# Patient Record
Sex: Male | Born: 1940 | Race: White | Hispanic: No | Marital: Married | Smoking: Former smoker
Health system: Southern US, Community
[De-identification: ages and names within clinical notes are randomized; demographics above are authoritative.]

## PROBLEM LIST (undated history)

## (undated) DIAGNOSIS — F419 Anxiety disorder, unspecified: Secondary | ICD-10-CM

## (undated) DIAGNOSIS — M5136 Other intervertebral disc degeneration, lumbar region: Secondary | ICD-10-CM

## (undated) DIAGNOSIS — N390 Urinary tract infection, site not specified: Secondary | ICD-10-CM

## (undated) DIAGNOSIS — M51369 Other intervertebral disc degeneration, lumbar region without mention of lumbar back pain or lower extremity pain: Secondary | ICD-10-CM

## (undated) DIAGNOSIS — H8109 Meniere's disease, unspecified ear: Secondary | ICD-10-CM

## (undated) DIAGNOSIS — J189 Pneumonia, unspecified organism: Secondary | ICD-10-CM

## (undated) DIAGNOSIS — Z86018 Personal history of other benign neoplasm: Secondary | ICD-10-CM

## (undated) DIAGNOSIS — F32A Depression, unspecified: Secondary | ICD-10-CM

## (undated) DIAGNOSIS — F329 Major depressive disorder, single episode, unspecified: Secondary | ICD-10-CM

## (undated) DIAGNOSIS — K429 Umbilical hernia without obstruction or gangrene: Secondary | ICD-10-CM

## (undated) DIAGNOSIS — M5030 Other cervical disc degeneration, unspecified cervical region: Secondary | ICD-10-CM

## (undated) DIAGNOSIS — I10 Essential (primary) hypertension: Secondary | ICD-10-CM

## (undated) DIAGNOSIS — G4730 Sleep apnea, unspecified: Secondary | ICD-10-CM

## (undated) DIAGNOSIS — F0390 Unspecified dementia without behavioral disturbance: Secondary | ICD-10-CM

## (undated) DIAGNOSIS — M549 Dorsalgia, unspecified: Secondary | ICD-10-CM

## (undated) DIAGNOSIS — G8929 Other chronic pain: Secondary | ICD-10-CM

## (undated) HISTORY — PX: GANGLION CYST EXCISION: SHX1691

## (undated) HISTORY — PX: APPENDECTOMY: SHX54

## (undated) HISTORY — PX: HERNIA REPAIR: SHX51

## (undated) HISTORY — DX: Umbilical hernia without obstruction or gangrene: K42.9

---
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
## 2005-05-11 ENCOUNTER — Encounter: Payer: Self-pay | Admitting: Internal Medicine

## 2006-08-04 ENCOUNTER — Ambulatory Visit: Payer: Self-pay | Admitting: Gastroenterology

## 2008-12-20 ENCOUNTER — Ambulatory Visit: Payer: Self-pay | Admitting: Internal Medicine

## 2009-01-30 ENCOUNTER — Ambulatory Visit: Payer: Self-pay | Admitting: Gastroenterology

## 2009-03-04 ENCOUNTER — Ambulatory Visit: Payer: Self-pay | Admitting: Surgery

## 2009-03-04 ENCOUNTER — Ambulatory Visit: Payer: Self-pay | Admitting: Cardiology

## 2009-03-10 ENCOUNTER — Ambulatory Visit: Payer: Self-pay | Admitting: Surgery

## 2009-10-21 ENCOUNTER — Ambulatory Visit: Payer: Self-pay | Admitting: Internal Medicine

## 2009-12-23 DIAGNOSIS — Z86018 Personal history of other benign neoplasm: Secondary | ICD-10-CM

## 2009-12-23 HISTORY — DX: Personal history of other benign neoplasm: Z86.018

## 2011-02-17 ENCOUNTER — Ambulatory Visit: Payer: Self-pay | Admitting: Gastroenterology

## 2012-11-17 ENCOUNTER — Ambulatory Visit: Payer: Self-pay | Admitting: Physical Medicine and Rehabilitation

## 2013-03-26 ENCOUNTER — Ambulatory Visit: Payer: Self-pay | Admitting: Internal Medicine

## 2013-03-26 LAB — CREATININE, SERUM: EGFR (Non-African Amer.): >60

## 2013-10-17 ENCOUNTER — Ambulatory Visit: Payer: Self-pay | Admitting: Neurology

## 2013-10-18 DIAGNOSIS — J189 Pneumonia, unspecified organism: Secondary | ICD-10-CM

## 2013-10-18 HISTORY — DX: Pneumonia, unspecified organism: J18.9

## 2014-02-27 DIAGNOSIS — G301 Alzheimer's disease with late onset: Secondary | ICD-10-CM

## 2014-02-27 DIAGNOSIS — F0280 Dementia in other diseases classified elsewhere without behavioral disturbance: Secondary | ICD-10-CM | POA: Insufficient documentation

## 2014-02-27 DIAGNOSIS — J309 Allergic rhinitis, unspecified: Secondary | ICD-10-CM | POA: Insufficient documentation

## 2014-02-27 DIAGNOSIS — M1990 Unspecified osteoarthritis, unspecified site: Secondary | ICD-10-CM | POA: Insufficient documentation

## 2014-02-27 DIAGNOSIS — M5090 Cervical disc disorder, unspecified, unspecified cervical region: Secondary | ICD-10-CM | POA: Insufficient documentation

## 2014-02-27 DIAGNOSIS — I10 Essential (primary) hypertension: Secondary | ICD-10-CM | POA: Insufficient documentation

## 2014-02-28 DIAGNOSIS — H8109 Meniere's disease, unspecified ear: Secondary | ICD-10-CM | POA: Insufficient documentation

## 2014-02-28 DIAGNOSIS — M519 Unspecified thoracic, thoracolumbar and lumbosacral intervertebral disc disorder: Secondary | ICD-10-CM | POA: Insufficient documentation

## 2016-03-08 ENCOUNTER — Other Ambulatory Visit: Payer: Self-pay | Admitting: Internal Medicine

## 2016-03-08 DIAGNOSIS — R1084 Generalized abdominal pain: Secondary | ICD-10-CM

## 2016-03-09 ENCOUNTER — Ambulatory Visit
Admission: RE | Admit: 2016-03-09 | Discharge: 2016-03-09 | Disposition: A | Discharge status: Home / Self Care | Payer: Medicare Other | Source: Ambulatory Visit | Attending: Internal Medicine | Admitting: Internal Medicine

## 2016-03-09 DIAGNOSIS — R1084 Generalized abdominal pain: Secondary | ICD-10-CM | POA: Diagnosis not present

## 2016-03-09 HISTORY — DX: Essential (primary) hypertension: I10

## 2016-03-09 MED ORDER — IOPAMIDOL (ISOVUE-300) INJECTION 61%
100.0000 mL | Freq: Once | INTRAVENOUS | Status: AC | PRN
  Administered 2016-03-09: 100 mL via INTRAVENOUS

## 2016-07-15 ENCOUNTER — Encounter: Payer: Self-pay | Admitting: Urology

## 2016-07-15 ENCOUNTER — Ambulatory Visit (INDEPENDENT_AMBULATORY_CARE_PROVIDER_SITE_OTHER): Payer: Medicare Other | Admitting: Urology

## 2016-07-15 VITALS — BP 121/79 | HR 78 | Ht 67.5 in | Wt 194.3 lb

## 2016-07-15 DIAGNOSIS — N401 Enlarged prostate with lower urinary tract symptoms: Secondary | ICD-10-CM

## 2016-07-15 DIAGNOSIS — R339 Retention of urine, unspecified: Secondary | ICD-10-CM | POA: Diagnosis not present

## 2016-07-15 DIAGNOSIS — N138 Other obstructive and reflux uropathy: Secondary | ICD-10-CM

## 2016-07-15 LAB — BLADDER SCAN AMB NON-IMAGING

## 2016-07-15 MED ORDER — LIDOCAINE HCL 2 % EX GEL
1.0000 "application " | Freq: Once | CUTANEOUS | Status: AC
  Administered 2016-07-15: 1 via URETHRAL

## 2016-07-15 MED ORDER — FINASTERIDE 5 MG PO TABS: 5.0000 mg | ORAL_TABLET | Freq: Every day | ORAL | 12 refills | Status: DC

## 2016-07-15 NOTE — Progress Notes (Signed)
Simple Catheter Placement  Due to urinary retention patient is present today for a foley cath placement.  Patient was cleaned and prepped in a sterile fashion with betadine and lidocaine jelly 2% was instilled into the urethra.  A 16 FR foley catheter was inserted, urine return was noted  838ml, urine was yellow in color.  The balloon was filled with 10cc of sterile water.  A leg bag was attached for drainage. Patient was also given a night bag to take home and was given instruction on how to change from one bag to another.  Patient was given instruction on proper catheter care.  Patient tolerated well, no complications were noted   Preformed by: Elberta Leatherwood, CMA  Additional notes/ Follow up: 1 week

## 2016-07-15 NOTE — Progress Notes (Signed)
07/15/2016 9:58 AM   Marvin Jefferson 12-14-1940 [id number]  Referring provider: Rusty Aus, MD [place] [facility] [place], [place] [address]  Chief Complaint  Patient presents with  . Benign Prostatic Hypertrophy    HPI: Patient is a 75 year old Caucasian male who is referred for worsening BPH with LUTS by Dr. Emily Filbert.    Patient states that several months ago he started to experience lower abdominal pain associated with lower back pain that was dull and cramping.  He was referred to GI by his PCP and a CT scan with contrast performed in 02/2016 noted mild pelvic caliectasis and bladder distention with significant enlargement of the prostate.  His was initiated on tamsulosin and the patient felt like his abdominal symptoms somewhat improved.  He was still experiencing significant urinary symptoms, so he presented to [place].    His IPSS score today is 23, which is severe lower urinary tract symptomatology. He is terrible with his quality life due to his urinary symptoms.  His PVR is 916 mL.  His major complaint today frequency, urgency, leakage, hesitancy, straining and a weak stream.   He has had these symptoms for several months.  He denies any dysuria, hematuria or suprapubic pain.  He currently taking tamsulosin 0.4 mg daily.   He also denies any recent fevers, chills, nausea or vomiting.  He states his father had his prostate removed when he was in his early 23's.  The patient is not certain if it was due to cancer.       IPSS    Row Name 07/15/16 0900         International Prostate Symptom Score   How often have you had the sensation of not emptying your bladder? Not at All     How often have you had to urinate less than every two hours? About half the time     How often have you found you stopped and started again several times when you urinated? More than half the time     How often have you found it difficult to postpone urination?  Almost always     How often have you had a weak urinary stream? More than half the time     How often have you had to strain to start urination? More than half the time     How many times did you typically get up at night to urinate? 3 Times     Total IPSS Score 23       Quality of Life due to urinary symptoms   If you were to spend the rest of your life with your urinary condition just the way it is now how would you feel about that? Terrible        Score:  1-7 Mild 8-19 Moderate 20-35 Severe    PMH: Past Medical History:  Diagnosis Date  . Hypertension   . Umbilical hernia     Surgical History: Past Surgical History:  Procedure Laterality Date  . APPENDECTOMY      Home Medications:    Medication List       Accurate as of 07/15/16  9:58 AM. Always use your most recent med list.          acetaminophen 325 MG tablet Commonly known as:  TYLENOL Take by mouth.   ALPRAZolam 0.25 MG tablet Commonly known as:  XANAX Take by mouth.   amLODipine 5 MG tablet Commonly known  as:  NORVASC   aspirin EC 81 MG tablet Take by mouth.   diclofenac 50 MG tablet Commonly known as:  CATAFLAM Take by mouth.   finasteride 5 MG tablet Commonly known as:  PROSCAR Take 1 tablet (5 mg total) by mouth daily.   FLUoxetine 10 MG tablet Commonly known as:  PROZAC Take by mouth.   fluticasone 50 MCG/ACT nasal spray Commonly known as:  FLONASE USE 2 SPRAYS IN EACH NOSTRIL EVERY DAY   galantamine 12 MG tablet Commonly known as:  RAZADYNE Take by mouth.   meclizine 25 MG tablet Commonly known as:  ANTIVERT TAKE ONE TABLET THREE TIMES A DAY AS NEEDED   tamsulosin 0.4 MG Caps capsule Commonly known as:  FLOMAX Take by mouth.   tamsulosin 0.4 MG Caps capsule Commonly known as:  FLOMAX   terazosin 5 MG capsule Commonly known as:  HYTRIN Take by mouth.   traMADol 50 MG tablet Commonly known as:  ULTRAM Take by mouth.   V-R VITAMIN B-12 500 MCG tablet Generic  drug:  cyanocobalamin Take by mouth.   Vitamin D3 2000 units capsule Take by mouth.   vitamin E 1000 UNIT capsule Take by mouth.       Allergies:  Allergies  Allergen Reactions  . Clarithromycin     Other reaction(s): Unknown    Family History: Family History  Problem Relation Age of Onset  . Prostate cancer Father   . Hypertension Father   . Stroke Father   . Hypertension Mother   . Dementia Mother   . Bladder Cancer Neg Hx   . Kidney cancer Neg Hx     Social History:  reports that he has quit smoking. He has never used smokeless tobacco. He reports that he does not drink alcohol or use drugs.  ROS: UROLOGY Frequent Urination?: Yes Hard to postpone urination?: Yes Burning/pain with urination?: No Get up at night to urinate?: No Leakage of urine?: Yes Urine stream starts and stops?: Yes Trouble starting stream?: No Do you have to strain to urinate?: Yes Blood in urine?: No Urinary tract infection?: No Sexually transmitted disease?: No Injury to kidneys or bladder?: No Painful intercourse?: No Weak stream?: Yes Erection problems?: No Penile pain?: No  Gastrointestinal Nausea?: Yes Vomiting?: Yes Indigestion/heartburn?: Yes Diarrhea?: No Constipation?: No  Constitutional Fever: No Night sweats?: No Weight loss?: No Fatigue?: Yes  Skin Skin rash/lesions?: No Itching?: No  Eyes Blurred vision?: No Double vision?: No  Ears/Nose/Throat Sore throat?: No Sinus problems?: No  Hematologic/Lymphatic Swollen glands?: No Easy bruising?: No  Cardiovascular Leg swelling?: No Chest pain?: No  Respiratory Cough?: Yes Shortness of breath?: No  Endocrine Excessive thirst?: No  Musculoskeletal Back pain?: Yes Joint pain?: Yes  Neurological Headaches?: No Dizziness?: Yes  Psychologic Depression?: Yes Anxiety?: Yes  Physical Exam: BP 121/79 (BP Location: Left Arm, Patient Position: Sitting, Cuff Size: Normal)   Pulse 78   Ht 5' 7.5"  (1.715 m)   Wt 194 lb 4.8 oz (88.1 kg)   BMI 29.98 kg/m   Constitutional: Well nourished. Alert and oriented, No acute distress. HEENT: [place] AT, moist mucus membranes. Trachea midline, no masses. Cardiovascular: No clubbing, cyanosis, or edema. Respiratory: Normal respiratory effort, no increased work of breathing. GI: Abdomen is soft, non tender, non distended, no abdominal masses. Liver and spleen not palpable.  No hernias appreciated.  Stool sample for occult testing is not indicated.   GU: No CVA tenderness.  No bladder fullness or masses.  Patient  with uncircumcised phallus. Foreskin easily retracted Urethral meatus is patent.  No penile discharge. No penile lesions or rashes. Scrotum without lesions, cysts, rashes and/or edema.  Testicles are located scrotally bilaterally. No masses are appreciated in the testicles. Left and right epididymis are normal. Rectal: Patient with  normal sphincter tone. Anus and perineum without scarring or rashes. No rectal masses are appreciated. Prostate is approximately  60 +grams, could not palpate the entire gland due to the size of the gland, no nodules are appreciated. Seminal vesicles are normal. Skin: No rashes, bruises or suspicious lesions. Lymph: No cervical or inguinal adenopathy. Neurologic: Grossly intact, no focal deficits, moving all 4 extremities. Psychiatric: Normal mood and affect.  Laboratory Data:  Lab Results  Component Value Date   CREATININE 0.92 03/26/2013    PSA on 03/01/2016 was 1.93 ng/mL - stable   Pertinent Imaging: CLINICAL DATA:  Low back pain radiating to umbilical area for 1-2 months. History of diverticulosis.  EXAM: CT ABDOMEN AND PELVIS WITH CONTRAST  TECHNIQUE: Multidetector CT imaging of the abdomen and pelvis was performed using the standard protocol following bolus administration of intravenous contrast.  CONTRAST:  120mL ISOVUE-300 IOPAMIDOL (ISOVUE-300) INJECTION 61%  COMPARISON:   None.  FINDINGS: Lower chest: Lung bases are clear. No effusions. Heart is normal size.  Hepatobiliary: Small hypodensities scattered throughout the liver compatible with small cysts. No biliary ductal dilatation. Gallbladder unremarkable.  Pancreas: No focal abnormality or ductal dilatation.  Spleen: No focal abnormality.  Normal size.  Adrenals/Urinary Tract: Nodular fullness of the left adrenal gland, likely hyperplasia. Mild pelvicaliectasis bilaterally. Bladder is distended, likely related to the prostate enlargement and some degree of bladder outlet obstruction. No focal renal abnormality or stones.  Stomach/Bowel: Stomach, large and small bowel grossly unremarkable. Appendix not identified. Question prior appendectomy. No inflammatory process in the right lower quadrant.  Vascular/Lymphatic: Scattered aortic and iliac calcifications. No aneurysm.  Reproductive: Prostate enlargement.  Other: No free fluid or free air.  Musculoskeletal: No acute bony abnormality. Degenerative changes throughout the lumbar spine.  IMPRESSION: Mild pelvic caliectasis and bladder distention. These findings are likely U related to some degree of bladder outlet obstruction with significant enlargement of the prostate.   Electronically Signed   By: Rolm Baptise M.D.   On: 03/09/2016 09:24      Assessment & Plan:    1. Urinary retention  - Foley catheter was placed without difficulty with a PVR of 800 cc of clear yellow urine  - Explained to the patient that the catheter will need to remain in place for one week  - Explained to the patient that he may experience blood in the urine in the next few days due to injury of the bladder and lack of tamponade effect of urine  - Explained to the patient Foley and leg/overnight bag care  - Given Vesicare 5 mg daily for spasms, reviewed side effects  - RTC in one week for catheter removal and to learn CIC   2. BPH with  LUTS  - IPSS score is 23/6   - Continue conservative management, avoiding bladder irritants and timed voiding's  - Initiate 5 alpha reductase inhibitor (finasteride), discussed side effects  - Continue tamsulosin 0.4 mg daily   - Schedule cystoscopy in anticipation for possible outlet procedure, as prostate is massive and doubtful medical therapy will be effective  - advised patient to postpone his colonoscopy at this time  Return for schedule cystoscopy with Dr. Erlene Quan only.  These notes generated  with voice recognition software. I apologize for typographical errors.  Zara Council, [facility] [address], [place], [place] [address] [phone]

## 2016-07-22 ENCOUNTER — Ambulatory Visit (INDEPENDENT_AMBULATORY_CARE_PROVIDER_SITE_OTHER): Payer: Medicare Other

## 2016-07-22 VITALS — BP 113/77 | HR 84 | Ht 67.0 in | Wt 190.0 lb

## 2016-07-22 DIAGNOSIS — R339 Retention of urine, unspecified: Secondary | ICD-10-CM | POA: Diagnosis not present

## 2016-07-22 DIAGNOSIS — N401 Enlarged prostate with lower urinary tract symptoms: Secondary | ICD-10-CM | POA: Diagnosis not present

## 2016-07-22 DIAGNOSIS — N138 Other obstructive and reflux uropathy: Secondary | ICD-10-CM | POA: Diagnosis not present

## 2016-07-22 NOTE — Progress Notes (Signed)
Catheter Removal  Patient is present today for a catheter removal.  70ml of water was drained from the balloon. A 16FR foley cath was removed from the bladder no complications were noted . Patient tolerated well.  Preformed by: [name], CMA  Continuous Intermittent Catheterization  Due to urinary retention patient is present today for a teaching of self I & O Catheterization. Patient was given detailed verbal and printed instructions of self catheterization. Patient was cleaned and prepped in a sterile fashion.  Due to patient's history of BPH w/LUTS a coude cath was given for insertion. With instruction and assistance patient inserted a 14FR coude and urine return was noted 10 ml, urine was dark yellow in color. Patient tolerated well, no complications were noted Patient was given a sample bag with supplies to take home.  Instructions were given per Zara Council for patient to cath 3 times daily patient is to try and urinate and then cath and record the residuals for a week.  An order was placed with [facility] for catheters to be sent to the patient's home. Patient is to follow up for cysto and bring residual diary to apt. Patient will need to continue CIC until cysto apt where possible surgery will be discussed.   Preformed by: [name], CMA

## 2016-07-27 ENCOUNTER — Ambulatory Visit: Payer: Medicare Other | Admitting: Urology

## 2016-07-27 VITALS — BP 148/80 | HR 88 | Ht 67.0 in | Wt 189.0 lb

## 2016-07-27 DIAGNOSIS — R338 Other retention of urine: Secondary | ICD-10-CM

## 2016-07-27 DIAGNOSIS — N401 Enlarged prostate with lower urinary tract symptoms: Secondary | ICD-10-CM

## 2016-07-27 MED ORDER — CIPROFLOXACIN HCL 500 MG PO TABS
500.0000 mg | ORAL_TABLET | Freq: Once | ORAL | Status: AC
  Administered 2016-07-27: 500 mg via ORAL

## 2016-07-27 MED ORDER — LIDOCAINE HCL 2 % EX GEL
1.0000 "application " | Freq: Once | CUTANEOUS | Status: AC
  Administered 2016-07-27: 1 via URETHRAL

## 2016-07-27 NOTE — Progress Notes (Signed)
07/27/2016 3:35 PM   Marvin Jefferson 19-Jun-1941 [id number]  Referring provider: Rusty Aus, MD [place] [facility] [place], [place] [address]  Chief Complaint  Patient presents with  . Cysto    HPI: 75 year old Caucasian male with chronic urinary retention, massive BPH status post Foley catheter placement for 1 week for urinary decompression and has since been managed with clean intermittent catheterization. He returns to the office today for cystoscopy and discuss his options moving forward.  Patient states that several months ago he started to experience lower abdominal pain associated with lower back pain that was dull and cramping.  He was referred to GI by his PCP and a CT scan with contrast performed in 02/2016 noted mild pelvic caliectasis and bladder distention with significant enlargement of the prostate.  His was initiated on tamsulosin and the patient felt like his abdominal symptoms somewhat improved.   His PVR was 916 mL in the office upon initial urological evaluation and a Foley was placed on 07/15/16.  He returned last week for a voiding trial at which time his catheter was removed. He has been catheterizing himself since then 3 times a day.  He brings in a voiding diary today showing catheterization volumes of as high as close to 700 cc. He does occasionally have the urge to void but is unable to do so since his catheter was removed.   His major complaint prior to Foley frequency, urgency, leakage, hesitancy, straining and a weak stream.   He has had these symptoms for several months but perhaps longer.   No history of bladder stones or UTIs.  He was started on Flomax several weeks ago by his primary care physician in since been started on finasteride by Zara Council.   He states his father had his prostate removed when he was in his early 27's.  The patient is not certain if it was due to cancer.    Cr 1.0 on 02/2016.      PMH: Past Medical History:  Diagnosis Date  . Hypertension   . Umbilical hernia     Surgical History: Past Surgical History:  Procedure Laterality Date  . APPENDECTOMY      Home Medications:    Medication List       Accurate as of 07/27/16  3:35 PM. Always use your most recent med list.          acetaminophen 325 MG tablet Commonly known as:  TYLENOL Take by mouth.   ALPRAZolam 0.25 MG tablet Commonly known as:  XANAX Take by mouth.   amLODipine 5 MG tablet Commonly known as:  NORVASC   aspirin EC 81 MG tablet Take by mouth.   diclofenac 50 MG tablet Commonly known as:  CATAFLAM Take by mouth.   finasteride 5 MG tablet Commonly known as:  PROSCAR Take 1 tablet (5 mg total) by mouth daily.   FLUoxetine 10 MG tablet Commonly known as:  PROZAC Take by mouth.   fluticasone 50 MCG/ACT nasal spray Commonly known as:  FLONASE USE 2 SPRAYS IN EACH NOSTRIL EVERY DAY   galantamine 12 MG tablet Commonly known as:  RAZADYNE Take by mouth.   meclizine 25 MG tablet Commonly known as:  ANTIVERT TAKE ONE TABLET THREE TIMES A DAY AS NEEDED   tamsulosin 0.4 MG Caps capsule Commonly known as:  FLOMAX   terazosin 5 MG capsule Commonly known as:  HYTRIN Take by mouth.   traMADol 50  MG tablet Commonly known as:  ULTRAM Take by mouth.   V-R VITAMIN B-12 500 MCG tablet Generic drug:  cyanocobalamin Take by mouth.   Vitamin D3 2000 units capsule Take by mouth.   vitamin E 1000 UNIT capsule Take by mouth.       Allergies:  Allergies  Allergen Reactions  . Clarithromycin     Other reaction(s): Unknown    Family History: Family History  Problem Relation Age of Onset  . Prostate cancer Father   . Hypertension Father   . Stroke Father   . Hypertension Mother   . Dementia Mother   . Bladder Cancer Neg Hx   . Kidney cancer Neg Hx     Social History:  reports that he has quit smoking. He has never used smokeless tobacco. He reports that he  does not drink alcohol or use drugs.  Physical Exam: BP (!) 148/80   Pulse 88   Ht 5\' 7"  (1.702 m)   Wt 189 lb (85.7 kg)   BMI 29.60 kg/m   Constitutional: Well nourished. Alert and oriented, No acute distress.  Accompanied by his wife today. HEENT: [place] AT, moist mucus membranes. Trachea midline, no masses. Cardiovascular: No clubbing, cyanosis, or edema. Respiratory: Normal respiratory effort, no increased work of breathing. GI: Abdomen is soft, non tender, non distended, no abdominal masses.  GU: Uncircumcised phallus. Foreskin easily retracted Urethral meatus is patent.  Testicles descended bilaterally. Neurologic: Grossly intact, no focal deficits, moving all 4 extremities. Psychiatric: Normal mood and affect.  Laboratory Data:  Lab Results  Component Value Date   CREATININE 0.92 03/26/2013    PSA on 03/01/2016 was 1.93 ng/mL - stable   Pertinent Imaging: CLINICAL DATA:  Low back pain radiating to umbilical area for 1-2 months. History of diverticulosis.  EXAM: CT ABDOMEN AND PELVIS WITH CONTRAST  TECHNIQUE: Multidetector CT imaging of the abdomen and pelvis was performed using the standard protocol following bolus administration of intravenous contrast.  CONTRAST:  167mL ISOVUE-300 IOPAMIDOL (ISOVUE-300) INJECTION 61%  COMPARISON:  None.  FINDINGS: Lower chest: Lung bases are clear. No effusions. Heart is normal size.  Hepatobiliary: Small hypodensities scattered throughout the liver compatible with small cysts. No biliary ductal dilatation. Gallbladder unremarkable.  Pancreas: No focal abnormality or ductal dilatation.  Spleen: No focal abnormality.  Normal size.  Adrenals/Urinary Tract: Nodular fullness of the left adrenal gland, likely hyperplasia. Mild pelvicaliectasis bilaterally. Bladder is distended, likely related to the prostate enlargement and some degree of bladder outlet obstruction. No focal renal abnormality  or stones.  Stomach/Bowel: Stomach, large and small bowel grossly unremarkable. Appendix not identified. Question prior appendectomy. No inflammatory process in the right lower quadrant.  Vascular/Lymphatic: Scattered aortic and iliac calcifications. No aneurysm.  Reproductive: Prostate enlargement.  Other: No free fluid or free air.  Musculoskeletal: No acute bony abnormality. Degenerative changes throughout the lumbar spine.  IMPRESSION: Mild pelvic caliectasis and bladder distention. These findings are likely U related to some degree of bladder outlet obstruction with significant enlargement of the prostate.   Electronically Signed   By: Rolm Baptise M.D.   On: 03/09/2016 09:24    Ct personally reviewed today and with the patient- prostate volume calculated ~115 cc.  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  Bladder currently being managed with clean intermittent catheterization, not spontaneously voiding. Maximal medical therapy with Flomax and finasteride  previously initiated.  At this point in time, I do feel that he would benefit from an outlet procedure as outlet obstruction is most likely cause of his chronic retention. We discussed that ideally, urodynamics could be pursued preop to evaluate his overall bladder function to assess bladder contractility, but given that he is not currently voiding, pressure flow study would not be possible.   Given the size of his gland, I would recommend either a simple prostatectomy versus holmium laser enucleation of the prostate. Risks and benefits of each were discussed. His most interested in a minimally invasive approach. We discussed risk and benefits in detail cutting risk of bleeding, retrograde ejaculation, damage to surrounding structures  including bladder injury, need for further procedures, failure of the procedure, stress incontinence, worsening of his irritative voiding symptoms amongst others. Postoperative course was also discussed. Given that he is in ongoing retention, we will likely leave his Foley in place x 7 days post op and may have to resume CIC.  All he and his wife's questions were answered today in detail.  1. Benign prostatic hyperplasia with urinary retention Schedule HoLEP Preop labs, UCx Continue CIC in the interim, increase frequency to qid given high bladder volumes - Urinalysis, Complete - ciprofloxacin (CIPRO) tablet 500 mg; Take 1 tablet (500 mg total) by mouth once. - lidocaine (XYLOCAINE) 2 % jelly 1 application; Place 1 application into the urethra once.  I spent 25 min with this patient of which greater than 50% was spent in counseling and coordination of care with the patient.  All questions were answered.  Discussion was lengthy today.   Hollice Espy, MD  [facility] [address], [place], [place] [address] [phone]

## 2016-07-27 NOTE — Progress Notes (Signed)
     07/27/16  Cystoscopy Procedure Note  Patient identification was confirmed, informed consent was obtained, and patient was prepped using Betadine solution.  Lidocaine jelly was administered per urethral meatus.    Preoperative abx where received prior to procedure.     Pre-Procedure: - Inspection reveals a normal caliber ureteral meatus.  Procedure: The flexible cystoscope was introduced without difficulty - No urethral strictures/lesions are present. - Enlarged prostate with significant trilobar coaptation, 6.5 cm prostatic length - Elevated bladder neck - Bilateral ureteral orifices identified - Bladder mucosa reveals inflammatory type lesions (areas of bullous edema, mildly raised with ecchymotic type appearance) in the bladder consistent with recent Foley catheter cystitis - No bladder stones - Moderate trabeculation  Retroflexion shows intravesical protrusion with a discrete median lobe and kissing lateral lobes.  Post-Procedure: - Patient tolerated the procedure well   Hollice Espy, MD

## 2016-07-28 ENCOUNTER — Telehealth: Payer: Self-pay | Admitting: Radiology

## 2016-07-28 ENCOUNTER — Other Ambulatory Visit: Payer: Self-pay | Admitting: Radiology

## 2016-07-28 DIAGNOSIS — N401 Enlarged prostate with lower urinary tract symptoms: Secondary | ICD-10-CM

## 2016-07-28 DIAGNOSIS — R338 Other retention of urine: Principal | ICD-10-CM

## 2016-07-28 LAB — MICROSCOPIC EXAMINATION
Epithelial Cells (non renal): NONE SEEN /hpf (ref 0–10)
WBC, UA: >30 /hpf — AB (ref 0–?)

## 2016-07-28 LAB — URINALYSIS, COMPLETE
BILIRUBIN UA: NEGATIVE
GLUCOSE, UA: NEGATIVE
Ketones, UA: NEGATIVE
NITRITE UA: NEGATIVE
PH UA: 5.5 (ref 5.0–7.5)
Specific Gravity, UA: >1.03 — ABNORMAL HIGH (ref 1.005–1.030)
UUROB: 0.2 mg/dL (ref 0.2–1.0)

## 2016-07-28 NOTE — Telephone Encounter (Signed)
Notified pt of surgery scheduled with Dr Erlene Quan on 08/11/16, pre-admit testing appt on 08/02/16 @11 :15 & to call day prior to surgery for arrival time to SDS. Pt voices understanding. Advised pt to hold ASA 81mg  7 days prior to surgery. Pt states he was told by Zara Council to hold ASA so he has not been taking it.

## 2016-07-28 NOTE — Telephone Encounter (Signed)
LMOM. Need to discuss surgery information. 

## 2016-07-29 ENCOUNTER — Ambulatory Visit: Admission: RE | Admit: 2016-07-29 | Payer: Medicare Other | Source: Ambulatory Visit | Admitting: Gastroenterology

## 2016-07-29 ENCOUNTER — Encounter: Admission: RE | Payer: Self-pay | Source: Ambulatory Visit

## 2016-07-29 SURGERY — COLONOSCOPY WITH PROPOFOL
Anesthesia: General

## 2016-08-02 ENCOUNTER — Encounter
Admission: RE | Admit: 2016-08-02 | Discharge: 2016-08-02 | Disposition: A | Discharge status: Home / Self Care | Payer: Medicare Other | Source: Ambulatory Visit | Attending: Urology | Admitting: Urology

## 2016-08-02 DIAGNOSIS — Z01818 Encounter for other preprocedural examination: Secondary | ICD-10-CM | POA: Insufficient documentation

## 2016-08-02 DIAGNOSIS — I10 Essential (primary) hypertension: Secondary | ICD-10-CM | POA: Diagnosis not present

## 2016-08-02 HISTORY — DX: Unspecified dementia, unspecified severity, without behavioral disturbance, psychotic disturbance, mood disturbance, and anxiety: F03.90

## 2016-08-02 HISTORY — DX: Other cervical disc degeneration, unspecified cervical region: M50.30

## 2016-08-02 HISTORY — DX: Other intervertebral disc degeneration, lumbar region without mention of lumbar back pain or lower extremity pain: M51.369

## 2016-08-02 HISTORY — DX: Pneumonia, unspecified organism: J18.9

## 2016-08-02 HISTORY — DX: Other intervertebral disc degeneration, lumbar region: M51.36

## 2016-08-02 HISTORY — DX: Anxiety disorder, unspecified: F41.9

## 2016-08-02 HISTORY — DX: Major depressive disorder, single episode, unspecified: F32.9

## 2016-08-02 HISTORY — DX: Depression, unspecified: F32.A

## 2016-08-02 LAB — BASIC METABOLIC PANEL
ANION GAP: 8 (ref 5–15)
BUN: 16 mg/dL (ref 6–20)
CALCIUM: 8.9 mg/dL (ref 8.9–10.3)
CO2: 24 mmol/L (ref 22–32)
Chloride: 106 mmol/L (ref 101–111)
Creatinine, Ser: 0.94 mg/dL (ref 0.61–1.24)
Glucose, Bld: 89 mg/dL (ref 65–99)
POTASSIUM: 3.5 mmol/L (ref 3.5–5.1)
SODIUM: 138 mmol/L (ref 135–145)

## 2016-08-02 LAB — CBC
HEMATOCRIT: 41.9 % (ref 40.0–52.0)
HEMOGLOBIN: 14.6 g/dL (ref 13.0–18.0)
MCH: 31.9 pg (ref 26.0–34.0)
MCHC: 34.9 g/dL (ref 32.0–36.0)
MCV: 91.3 fL (ref 80.0–100.0)
Platelets: 213 10*3/uL (ref 150–440)
RBC: 4.59 MIL/uL (ref 4.40–5.90)
RDW: 13.3 % (ref 11.5–14.5)
WBC: 7.1 10*3/uL (ref 3.8–10.6)

## 2016-08-02 LAB — URINALYSIS COMPLETE WITH MICROSCOPIC (ARMC ONLY)
BACTERIA UA: NONE SEEN
Bilirubin Urine: NEGATIVE
Glucose, UA: NEGATIVE mg/dL
HGB URINE DIPSTICK: NEGATIVE
KETONES UR: NEGATIVE mg/dL
NITRITE: NEGATIVE
PROTEIN: 30 mg/dL — AB
SPECIFIC GRAVITY, URINE: 1.01 (ref 1.005–1.030)
Squamous Epithelial / LPF: NONE SEEN
pH: 5 (ref 5.0–8.0)

## 2016-08-02 NOTE — Patient Instructions (Signed)
  Your procedure is scheduled on: WED. 08/11/16 Report to Day Surgery.[facility]) To find out your arrival time please call [phone] between 1PM - 3PM on Tues. 08/10/16  Remember: Instructions that are not followed completely may result in serious medical risk, up to and including death, or upon the discretion of your surgeon and anesthesiologist your surgery may need to be rescheduled.    __x__ 1. Do not eat food or drink liquids after midnight. No gum chewing or hard candies.     __x__ 2. No Alcohol for 24 hours before or after surgery.   ____ 3. Do Not Smoke For 24 Hours Prior to Your Surgery.   ____ 4. Bring all medications with you on the day of surgery if instructed.    ___x_ 5. Notify your doctor if there is any change in your medical condition     (cold, fever, infections).       Do not wear jewelry, make-up, hairpins, clips or nail polish.  Do not wear lotions, powders, or perfumes. You may wear deodorant.  Do not shave 48 hours prior to surgery. Men may shave face and neck.  Do not bring valuables to the hospital.    [facility] is not responsible for any belongings or valuables.               Contacts, dentures or bridgework may not be worn into surgery.  Leave your suitcase in the car. After surgery it may be brought to your room.  For patients admitted to the hospital, discharge time is determined by your                treatment team.   Patients discharged the day of surgery will not be allowed to drive home.   Please read over the following fact sheets that you were given:      _x___ Take these medicines the morning of surgery with A SIP OF WATER:    1. amLODipine (NORVASC) 5 MG tablet  2. FLUoxetine (PROZAC) 10 MG tablet  3. fluticasone (FLONASE) 50 MCG/ACT nasal spray  4.terazosin (HYTRIN) 5 MG capsule  5.traMADol (ULTRAM) 50 MG tablet(if needed)  6.  ____ Fleet Enema (as directed)   ____ Use CHG Soap as directed  ____ Use inhalers on the day  of surgery  ____ Stop metformin 2 days prior to surgery    ____ Take 1/2 of usual insulin dose the night before surgery and none on the morning of surgery.   ___x_ Stop aspirin already  ___x_ Stop Anti-inflammatories on 08/04/16 diclofenac (CATAFLAM) 50 MG tablet,  Tylenol is OK   ___x_ Stop supplements on 10/18/17Omega-3 Fatty Acids (FISH OIL) 1000 MG CAPS,Misc Natural Products (GLUCOSAMINE CHOND MSM FORMULA PO)vitamin E 1000 UNIT capsule  ____ Bring C-Pap to the hospital.

## 2016-08-03 ENCOUNTER — Encounter: Payer: Self-pay | Admitting: Urology

## 2016-08-03 DIAGNOSIS — N400 Enlarged prostate without lower urinary tract symptoms: Secondary | ICD-10-CM

## 2016-08-04 ENCOUNTER — Telehealth: Payer: Self-pay

## 2016-08-04 DIAGNOSIS — N390 Urinary tract infection, site not specified: Secondary | ICD-10-CM

## 2016-08-04 LAB — URINE CULTURE: Culture: >=100000 — AB

## 2016-08-04 MED ORDER — NITROFURANTOIN MONOHYD MACRO 100 MG PO CAPS: 100.0000 mg | ORAL_CAPSULE | Freq: Two times a day (BID) | ORAL | 0 refills | Status: AC

## 2016-08-04 MED ORDER — TAMSULOSIN HCL 0.4 MG PO CAPS: 0.4000 mg | ORAL_CAPSULE | Freq: Every day | ORAL | 0 refills | Status: DC

## 2016-08-04 NOTE — Telephone Encounter (Signed)
Spoke with pt in reference to +ucx and needing to start today for surgery. Pt voiced understanding. Medication sent to pharmacy.

## 2016-08-04 NOTE — Telephone Encounter (Signed)
-----   Message from Hollice Espy, MD sent at 08/04/2016 10:15 AM EDT ----- Treat preop with macrobid 100 mg bid x 10 days starting today.  Hollice Espy, MD

## 2016-08-05 NOTE — Pre-Procedure Instructions (Signed)
Urine culture results faxed to Dr. Cherrie Gauze office for review and treatment as indicated.

## 2016-08-09 ENCOUNTER — Encounter: Payer: Self-pay | Admitting: Urology

## 2016-08-11 ENCOUNTER — Ambulatory Visit: Payer: Medicare Other | Admitting: Anesthesiology

## 2016-08-11 ENCOUNTER — Ambulatory Visit
Admission: RE | Admit: 2016-08-11 | Discharge: 2016-08-11 | Disposition: A | Discharge status: Home / Self Care | Payer: Medicare Other | Source: Ambulatory Visit | Attending: Urology | Admitting: Urology

## 2016-08-11 ENCOUNTER — Encounter
Admission: RE | Disposition: A | Discharge status: Home / Self Care | Payer: Self-pay | Source: Ambulatory Visit | Attending: Urology

## 2016-08-11 ENCOUNTER — Encounter: Payer: Self-pay | Admitting: Anesthesiology

## 2016-08-11 DIAGNOSIS — F418 Other specified anxiety disorders: Secondary | ICD-10-CM | POA: Insufficient documentation

## 2016-08-11 DIAGNOSIS — N138 Other obstructive and reflux uropathy: Secondary | ICD-10-CM | POA: Diagnosis not present

## 2016-08-11 DIAGNOSIS — I10 Essential (primary) hypertension: Secondary | ICD-10-CM | POA: Insufficient documentation

## 2016-08-11 DIAGNOSIS — R338 Other retention of urine: Secondary | ICD-10-CM | POA: Insufficient documentation

## 2016-08-11 DIAGNOSIS — Z87891 Personal history of nicotine dependence: Secondary | ICD-10-CM | POA: Insufficient documentation

## 2016-08-11 DIAGNOSIS — Z7951 Long term (current) use of inhaled steroids: Secondary | ICD-10-CM | POA: Diagnosis not present

## 2016-08-11 DIAGNOSIS — Z7982 Long term (current) use of aspirin: Secondary | ICD-10-CM | POA: Diagnosis not present

## 2016-08-11 DIAGNOSIS — Z79899 Other long term (current) drug therapy: Secondary | ICD-10-CM | POA: Diagnosis not present

## 2016-08-11 DIAGNOSIS — Z538 Procedure and treatment not carried out for other reasons: Secondary | ICD-10-CM | POA: Insufficient documentation

## 2016-08-11 DIAGNOSIS — N401 Enlarged prostate with lower urinary tract symptoms: Secondary | ICD-10-CM | POA: Insufficient documentation

## 2016-08-11 SURGERY — ENUCLEATION, PROSTATE, USING LASER, WITH MORCELLATION
Anesthesia: General

## 2016-08-11 MED ORDER — LACTATED RINGERS IV SOLN
INTRAVENOUS | Status: DC
  Administered 2016-08-11: 11:00:00 via INTRAVENOUS

## 2016-08-11 MED ORDER — FAMOTIDINE 20 MG PO TABS
ORAL_TABLET | ORAL | Status: AC
  Filled 2016-08-11: qty 1

## 2016-08-11 MED ORDER — FAMOTIDINE 20 MG PO TABS
20.0000 mg | ORAL_TABLET | Freq: Once | ORAL | Status: AC
  Administered 2016-08-11: 20 mg via ORAL

## 2016-08-11 MED ORDER — CEFAZOLIN SODIUM-DEXTROSE 2-4 GM/100ML-% IV SOLN: 2.0000 g | INTRAVENOUS | Status: DC

## 2016-08-11 MED ORDER — CEFAZOLIN SODIUM-DEXTROSE 2-4 GM/100ML-% IV SOLN
INTRAVENOUS | Status: AC
  Filled 2016-08-11: qty 100

## 2016-08-11 SURGICAL SUPPLY — 35 items
ADAPTER IRRIG TUBE 2 SPIKE SOL (ADAPTER) ×6 IMPLANT
BAG URO DRAIN 2000ML W/SPOUT (MISCELLANEOUS) ×3 IMPLANT
BAG URO DRAIN 4000ML (MISCELLANEOUS) ×3 IMPLANT
CATH FOL 2WAY LX 20X30 (CATHETERS) IMPLANT
CATH FOL LEG HOLDER (MISCELLANEOUS) ×3 IMPLANT
CATH FOLEY 3WAY 30CC 22FR (CATHETERS) IMPLANT
CATH URETL 5X70 OPEN END (CATHETERS) ×3 IMPLANT
CNTNR SPEC 2.5X3XGRAD LEK (MISCELLANEOUS) ×1
CONT SPEC 4OZ STER OR WHT (MISCELLANEOUS) ×2
CONTAINER COLLECT MORCELLATR (MISCELLANEOUS) ×1 IMPLANT
CONTAINER SPEC 2.5X3XGRAD LEK (MISCELLANEOUS) ×1 IMPLANT
DRAPE SHEET LG 3/4 BI-LAMINATE (DRAPES) ×3 IMPLANT
DRAPE UTILITY 15X26 TOWEL STRL (DRAPES) ×3 IMPLANT
FILTER OVERFLOW MORCELLATOR (FILTER) ×1 IMPLANT
GLOVE BIO SURGEON STRL SZ 6.5 (GLOVE) ×2 IMPLANT
GLOVE BIO SURGEONS STRL SZ 6.5 (GLOVE) ×1
GOWN STRL REUS W/ TWL LRG LVL3 (GOWN DISPOSABLE) ×2 IMPLANT
GOWN STRL REUS W/TWL LRG LVL3 (GOWN DISPOSABLE) ×4
KIT RM TURNOVER CYSTO AR (KITS) ×3 IMPLANT
MORCELLATOR COLLECT CONTAINER (MISCELLANEOUS) ×3
MORCELLATOR OVERFLOW FILTER (FILTER) ×3
MORCELLATOR ROTATION 4.75 335 (MISCELLANEOUS) ×3 IMPLANT
PACK CYSTO AR (MISCELLANEOUS) ×3 IMPLANT
PREP PVP WINGED SPONGE (MISCELLANEOUS) ×3 IMPLANT
PUMP SINGLE ACTION SAP (PUMP) IMPLANT
SENSORWIRE 0.038 NOT ANGLED (WIRE) ×6
SET IRRIG Y TYPE TUR BLADDER L (SET/KITS/TRAYS/PACK) ×3 IMPLANT
SOL .9 NS 3000ML IRR  AL (IV SOLUTION) ×12
SOL .9 NS 3000ML IRR UROMATIC (IV SOLUTION) ×6 IMPLANT
SYRINGE IRR TOOMEY STRL 70CC (SYRINGE) ×3 IMPLANT
TUBE PUMP MORCELLATOR PIRANHA (TUBING) ×3 IMPLANT
TUBING CONNECTING 10 (TUBING) ×2 IMPLANT
TUBING CONNECTING 10' (TUBING) ×1
WATER STERILE IRR 1000ML POUR (IV SOLUTION) ×3 IMPLANT
WIRE SENSOR 0.038 NOT ANGLED (WIRE) ×2 IMPLANT

## 2016-08-11 NOTE — OR Nursing (Signed)
Dr. Erlene Quan in to speak to patient and his wife to explain that surgery would have to be cancelled today as the proper equipment for surgery was not available.  Patient was understanding about this even though it requires him to continue to straight cath himself for another 2 weeks and to purchase supplies for another 2 weeks.  Patient and his wife were very acceptinig of the situation and were discharged home ambulatory.  Instructed to call SDS the day before surgery between 1 and 3pm to learn of arrival time for a return surgery.

## 2016-08-11 NOTE — Anesthesia Preprocedure Evaluation (Addendum)
Anesthesia Evaluation  Patient identified by MRN, date of birth, ID band Patient awake    Reviewed: Allergy & Precautions, NPO status , Patient's Chart, lab work & pertinent test results  Airway Mallampati: II  TM Distance: >3 FB     Dental  (+) Caps   Pulmonary pneumonia, resolved, former smoker,    Pulmonary exam normal        Cardiovascular hypertension, Pt. on medications Normal cardiovascular exam     Neuro/Psych PSYCHIATRIC DISORDERS Anxiety Depression    GI/Hepatic negative GI ROS, Neg liver ROS,   Endo/Other  negative endocrine ROS  Renal/GU negative Renal ROS  negative genitourinary   Musculoskeletal  (+) Arthritis , Osteoarthritis,    Abdominal Normal abdominal exam  (+)   Peds negative pediatric ROS (+)  Hematology negative hematology ROS (+)   Anesthesia Other Findings   Reproductive/Obstetrics                            Anesthesia Physical Anesthesia Plan  ASA: III  Anesthesia Plan: General   Post-op Pain Management:    Induction: Intravenous  Airway Management Planned: Oral ETT  Additional Equipment:   Intra-op Plan:   Post-operative Plan: Extubation in OR  Informed Consent: I have reviewed the patients History and Physical, chart, labs and discussed the procedure including the risks, benefits and alternatives for the proposed anesthesia with the patient or authorized representative who has indicated his/her understanding and acceptance.   Dental advisory given  Plan Discussed with: CRNA and Surgeon  Anesthesia Plan Comments:         Anesthesia Quick Evaluation

## 2016-08-11 NOTE — Interval H&P Note (Signed)
History and Physical Interval Note:  08/11/2016 10:18 AM  Marvin Jefferson  has presented today for surgery, with the diagnosis of bph with urinary retention  The various methods of treatment have been discussed with the patient and family. After consideration of risks, benefits and other options for treatment, the patient has consented to  Procedure(s): [place] WITH MORCELLATION (N/A) as a surgical intervention .  The patient's history has been reviewed, patient examined, no change in status, stable for surgery.  I have reviewed the patient's chart and labs.  Questions were answered to the patient's satisfaction.     RRR CTAB  Hollice Espy

## 2016-08-11 NOTE — H&P (View-Only) (Signed)
07/27/2016 3:35 PM   Marvin Jefferson September 10, 1941 [id number]  Referring provider: Rusty Aus, MD [facility] [place], [place] [address]  Chief Complaint  Patient presents with  . Cysto    HPI: 75 year old Caucasian male with chronic urinary retention, massive BPH status post Foley catheter placement for 1 week for urinary decompression and has since been managed with clean intermittent catheterization. He returns to the office today for cystoscopy and discuss his options moving forward.  Patient states that several months ago he started to experience lower abdominal pain associated with lower back pain that was dull and cramping.  He was referred to GI by his PCP and a CT scan with contrast performed in 02/2016 noted mild pelvic caliectasis and bladder distention with significant enlargement of the prostate.  His was initiated on tamsulosin and the patient felt like his abdominal symptoms somewhat improved.   His PVR was 916 mL in the office upon initial urological evaluation and a Foley was placed on 07/15/16.  He returned last week for a voiding trial at which time his catheter was removed. He has been catheterizing himself since then 3 times a day.  He brings in a voiding diary today showing catheterization volumes of as high as close to 700 cc. He does occasionally have the urge to void but is unable to do so since his catheter was removed.   His major complaint prior to Foley frequency, urgency, leakage, hesitancy, straining and a weak stream.   He has had these symptoms for several months but perhaps longer.   No history of bladder stones or UTIs.  He was started on Flomax several weeks ago by his primary care physician in since been started on finasteride by Zara Council.   He states his father had his prostate removed when he was in his early 68's.  The patient is not certain if it was due to cancer.    Cr 1.0 on 02/2016.      PMH: Past Medical History:  Diagnosis Date  . Hypertension   . Umbilical hernia     Surgical History: Past Surgical History:  Procedure Laterality Date  . APPENDECTOMY      Home Medications:    Medication List       Accurate as of 07/27/16  3:35 PM. Always use your most recent med list.          acetaminophen 325 MG tablet Commonly known as:  TYLENOL Take by mouth.   ALPRAZolam 0.25 MG tablet Commonly known as:  XANAX Take by mouth.   amLODipine 5 MG tablet Commonly known as:  NORVASC   aspirin EC 81 MG tablet Take by mouth.   diclofenac 50 MG tablet Commonly known as:  CATAFLAM Take by mouth.   finasteride 5 MG tablet Commonly known as:  PROSCAR Take 1 tablet (5 mg total) by mouth daily.   FLUoxetine 10 MG tablet Commonly known as:  PROZAC Take by mouth.   fluticasone 50 MCG/ACT nasal spray Commonly known as:  FLONASE USE 2 SPRAYS IN EACH NOSTRIL EVERY DAY   galantamine 12 MG tablet Commonly known as:  RAZADYNE Take by mouth.   meclizine 25 MG tablet Commonly known as:  ANTIVERT TAKE ONE TABLET THREE TIMES A DAY AS NEEDED   tamsulosin 0.4 MG Caps capsule Commonly known as:  FLOMAX   terazosin 5 MG capsule Commonly known as:  HYTRIN Take by mouth.   traMADol 50  MG tablet Commonly known as:  ULTRAM Take by mouth.   V-R VITAMIN B-12 500 MCG tablet Generic drug:  cyanocobalamin Take by mouth.   Vitamin D3 2000 units capsule Take by mouth.   vitamin E 1000 UNIT capsule Take by mouth.       Allergies:  Allergies  Allergen Reactions  . Clarithromycin     Other reaction(s): Unknown    Family History: Family History  Problem Relation Age of Onset  . Prostate cancer Father   . Hypertension Father   . Stroke Father   . Hypertension Mother   . Dementia Mother   . Bladder Cancer Neg Hx   . Kidney cancer Neg Hx     Social History:  reports that he has quit smoking. He has never used smokeless tobacco. He reports that he  does not drink alcohol or use drugs.  Physical Exam: BP (!) 148/80   Pulse 88   Ht 5\' 7"  (1.702 m)   Wt 189 lb (85.7 kg)   BMI 29.60 kg/m   Constitutional: Well nourished. Alert and oriented, No acute distress.  Accompanied by his wife today. HEENT: [place] AT, moist mucus membranes. Trachea midline, no masses. Cardiovascular: No clubbing, cyanosis, or edema. Respiratory: Normal respiratory effort, no increased work of breathing. GI: Abdomen is soft, non tender, non distended, no abdominal masses.  GU: Uncircumcised phallus. Foreskin easily retracted Urethral meatus is patent.  Testicles descended bilaterally. Neurologic: Grossly intact, no focal deficits, moving all 4 extremities. Psychiatric: Normal mood and affect.  Laboratory Data:  Lab Results  Component Value Date   CREATININE 0.92 03/26/2013    PSA on 03/01/2016 was 1.93 ng/mL - stable   Pertinent Imaging: CLINICAL DATA:  Low back pain radiating to umbilical area for 1-2 months. History of diverticulosis.  EXAM: CT ABDOMEN AND PELVIS WITH CONTRAST  TECHNIQUE: Multidetector CT imaging of the abdomen and pelvis was performed using the standard protocol following bolus administration of intravenous contrast.  CONTRAST:  137mL ISOVUE-300 IOPAMIDOL (ISOVUE-300) INJECTION 61%  COMPARISON:  None.  FINDINGS: Lower chest: Lung bases are clear. No effusions. Heart is normal size.  Hepatobiliary: Small hypodensities scattered throughout the liver compatible with small cysts. No biliary ductal dilatation. Gallbladder unremarkable.  Pancreas: No focal abnormality or ductal dilatation.  Spleen: No focal abnormality.  Normal size.  Adrenals/Urinary Tract: Nodular fullness of the left adrenal gland, likely hyperplasia. Mild pelvicaliectasis bilaterally. Bladder is distended, likely related to the prostate enlargement and some degree of bladder outlet obstruction. No focal renal abnormality  or stones.  Stomach/Bowel: Stomach, large and small bowel grossly unremarkable. Appendix not identified. Question prior appendectomy. No inflammatory process in the right lower quadrant.  Vascular/Lymphatic: Scattered aortic and iliac calcifications. No aneurysm.  Reproductive: Prostate enlargement.  Other: No free fluid or free air.  Musculoskeletal: No acute bony abnormality. Degenerative changes throughout the lumbar spine.  IMPRESSION: Mild pelvic caliectasis and bladder distention. These findings are likely U related to some degree of bladder outlet obstruction with significant enlargement of the prostate.   Electronically Signed   By: Rolm Baptise M.D.   On: 03/09/2016 09:24    Ct personally reviewed today and with the patient- prostate volume calculated ~115 cc.  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  Bladder currently being managed with clean intermittent catheterization, not spontaneously voiding. Maximal medical therapy with Flomax and finasteride  previously initiated.  At this point in time, I do feel that he would benefit from an outlet procedure as outlet obstruction is most likely cause of his chronic retention. We discussed that ideally, urodynamics could be pursued preop to evaluate his overall bladder function to assess bladder contractility, but given that he is not currently voiding, pressure flow study would not be possible.   Given the size of his gland, I would recommend either a simple prostatectomy versus holmium laser enucleation of the prostate. Risks and benefits of each were discussed. His most interested in a minimally invasive approach. We discussed risk and benefits in detail cutting risk of bleeding, retrograde ejaculation, damage to surrounding structures  including bladder injury, need for further procedures, failure of the procedure, stress incontinence, worsening of his irritative voiding symptoms amongst others. Postoperative course was also discussed. Given that he is in ongoing retention, we will likely leave his Foley in place x 7 days post op and may have to resume CIC.  All he and his wife's questions were answered today in detail.  1. Benign prostatic hyperplasia with urinary retention Schedule HoLEP Preop labs, UCx Continue CIC in the interim, increase frequency to qid given high bladder volumes - Urinalysis, Complete - ciprofloxacin (CIPRO) tablet 500 mg; Take 1 tablet (500 mg total) by mouth once. - lidocaine (XYLOCAINE) 2 % jelly 1 application; Place 1 application into the urethra once.  I spent 25 min with this patient of which greater than 50% was spent in counseling and coordination of care with the patient.  All questions were answered.  Discussion was lengthy today.   Hollice Espy, MD  [facility] [address] [place], [place] [address] [phone]

## 2016-08-19 ENCOUNTER — Encounter: Payer: Self-pay | Admitting: Urology

## 2016-08-20 ENCOUNTER — Other Ambulatory Visit: Payer: Self-pay | Admitting: Radiology

## 2016-08-20 DIAGNOSIS — R338 Other retention of urine: Principal | ICD-10-CM

## 2016-08-20 DIAGNOSIS — N401 Enlarged prostate with lower urinary tract symptoms: Secondary | ICD-10-CM

## 2016-08-22 MED ORDER — CEFAZOLIN SODIUM-DEXTROSE 2-4 GM/100ML-% IV SOLN
2.0000 g | INTRAVENOUS | Status: AC
  Administered 2016-08-23: 2 g via INTRAVENOUS

## 2016-08-23 ENCOUNTER — Ambulatory Visit: Payer: Medicare Other | Admitting: Anesthesiology

## 2016-08-23 ENCOUNTER — Ambulatory Visit
Admission: RE | Admit: 2016-08-23 | Discharge: 2016-08-23 | Disposition: A | Discharge status: Home / Self Care | Payer: Medicare Other | Source: Ambulatory Visit | Attending: Urology | Admitting: Urology

## 2016-08-23 ENCOUNTER — Encounter: Payer: Self-pay | Admitting: *Deleted

## 2016-08-23 ENCOUNTER — Encounter
Admission: RE | Disposition: A | Discharge status: Home / Self Care | Payer: Self-pay | Source: Ambulatory Visit | Attending: Urology

## 2016-08-23 DIAGNOSIS — Z823 Family history of stroke: Secondary | ICD-10-CM | POA: Insufficient documentation

## 2016-08-23 DIAGNOSIS — Z8042 Family history of malignant neoplasm of prostate: Secondary | ICD-10-CM | POA: Diagnosis not present

## 2016-08-23 DIAGNOSIS — Z87891 Personal history of nicotine dependence: Secondary | ICD-10-CM | POA: Diagnosis not present

## 2016-08-23 DIAGNOSIS — Z8489 Family history of other specified conditions: Secondary | ICD-10-CM | POA: Diagnosis not present

## 2016-08-23 DIAGNOSIS — F329 Major depressive disorder, single episode, unspecified: Secondary | ICD-10-CM | POA: Insufficient documentation

## 2016-08-23 DIAGNOSIS — N410 Acute prostatitis: Secondary | ICD-10-CM | POA: Diagnosis not present

## 2016-08-23 DIAGNOSIS — N3289 Other specified disorders of bladder: Secondary | ICD-10-CM | POA: Insufficient documentation

## 2016-08-23 DIAGNOSIS — N401 Enlarged prostate with lower urinary tract symptoms: Secondary | ICD-10-CM | POA: Insufficient documentation

## 2016-08-23 DIAGNOSIS — Z881 Allergy status to other antibiotic agents status: Secondary | ICD-10-CM | POA: Insufficient documentation

## 2016-08-23 DIAGNOSIS — I10 Essential (primary) hypertension: Secondary | ICD-10-CM | POA: Insufficient documentation

## 2016-08-23 DIAGNOSIS — Z7982 Long term (current) use of aspirin: Secondary | ICD-10-CM | POA: Diagnosis not present

## 2016-08-23 DIAGNOSIS — R338 Other retention of urine: Secondary | ICD-10-CM | POA: Insufficient documentation

## 2016-08-23 DIAGNOSIS — N411 Chronic prostatitis: Secondary | ICD-10-CM | POA: Diagnosis not present

## 2016-08-23 DIAGNOSIS — Z8249 Family history of ischemic heart disease and other diseases of the circulatory system: Secondary | ICD-10-CM | POA: Diagnosis not present

## 2016-08-23 DIAGNOSIS — F419 Anxiety disorder, unspecified: Secondary | ICD-10-CM | POA: Insufficient documentation

## 2016-08-23 HISTORY — PX: 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: SHX6641

## 2016-08-23 SURGERY — ENUCLEATION, PROSTATE, USING LASER, WITH MORCELLATION
Anesthesia: General | Site: Penis | Wound class: Clean Contaminated

## 2016-08-23 MED ORDER — FENTANYL CITRATE (PF) 100 MCG/2ML IJ SOLN: 25.0000 ug | INTRAMUSCULAR | Status: DC | PRN

## 2016-08-23 MED ORDER — SUGAMMADEX SODIUM 500 MG/5ML IV SOLN
INTRAVENOUS | Status: DC | PRN
  Administered 2016-08-23: 180 mg via INTRAVENOUS

## 2016-08-23 MED ORDER — EPHEDRINE SULFATE 50 MG/ML IJ SOLN
INTRAMUSCULAR | Status: DC | PRN
  Administered 2016-08-23 (×2): 10 mg via INTRAVENOUS
  Administered 2016-08-23: 5 mg via INTRAVENOUS
  Administered 2016-08-23: 10 mg via INTRAVENOUS
  Administered 2016-08-23: 15 mg via INTRAVENOUS

## 2016-08-23 MED ORDER — MIDAZOLAM HCL 2 MG/2ML IJ SOLN
INTRAMUSCULAR | Status: DC | PRN
  Administered 2016-08-23: 2 mg via INTRAVENOUS

## 2016-08-23 MED ORDER — HYDROCODONE-ACETAMINOPHEN 5-325 MG PO TABS: 1.0000 | ORAL_TABLET | Freq: Four times a day (QID) | ORAL | 0 refills | Status: DC | PRN

## 2016-08-23 MED ORDER — PHENYLEPHRINE HCL 10 MG/ML IJ SOLN
INTRAMUSCULAR | Status: DC | PRN
  Administered 2016-08-23: 75 ug/min via INTRAVENOUS

## 2016-08-23 MED ORDER — CEFAZOLIN SODIUM-DEXTROSE 2-4 GM/100ML-% IV SOLN
INTRAVENOUS | Status: AC
  Filled 2016-08-23: qty 100

## 2016-08-23 MED ORDER — LIDOCAINE HCL (CARDIAC) 20 MG/ML IV SOLN
INTRAVENOUS | Status: DC | PRN
  Administered 2016-08-23: 80 mg via INTRAVENOUS

## 2016-08-23 MED ORDER — ONDANSETRON HCL 4 MG/2ML IJ SOLN: 4.0000 mg | Freq: Once | INTRAMUSCULAR | Status: DC | PRN

## 2016-08-23 MED ORDER — FENTANYL CITRATE (PF) 100 MCG/2ML IJ SOLN
INTRAMUSCULAR | Status: AC
  Filled 2016-08-23: qty 2

## 2016-08-23 MED ORDER — PHENYLEPHRINE HCL 10 MG/ML IJ SOLN
INTRAMUSCULAR | Status: DC | PRN
  Administered 2016-08-23 (×2): 100 ug via INTRAVENOUS

## 2016-08-23 MED ORDER — FENTANYL CITRATE (PF) 100 MCG/2ML IJ SOLN
25.0000 ug | INTRAMUSCULAR | Status: DC | PRN
  Administered 2016-08-23 (×3): 25 ug via INTRAVENOUS

## 2016-08-23 MED ORDER — HYDROCODONE-ACETAMINOPHEN 5-325 MG PO TABS
1.0000 | ORAL_TABLET | Freq: Four times a day (QID) | ORAL | Status: DC | PRN
  Administered 2016-08-23: 1 via ORAL

## 2016-08-23 MED ORDER — FUROSEMIDE 10 MG/ML IJ SOLN
INTRAMUSCULAR | Status: DC | PRN
  Administered 2016-08-23: 10 mg via INTRAVENOUS

## 2016-08-23 MED ORDER — PROPOFOL 10 MG/ML IV BOLUS
INTRAVENOUS | Status: DC | PRN
  Administered 2016-08-23: 40 mg via INTRAVENOUS
  Administered 2016-08-23: 100 mg via INTRAVENOUS

## 2016-08-23 MED ORDER — FAMOTIDINE 20 MG PO TABS
ORAL_TABLET | ORAL | Status: AC
  Filled 2016-08-23: qty 1

## 2016-08-23 MED ORDER — HYDROCODONE-ACETAMINOPHEN 5-325 MG PO TABS
ORAL_TABLET | ORAL | Status: AC
  Filled 2016-08-23: qty 1

## 2016-08-23 MED ORDER — FENTANYL CITRATE (PF) 100 MCG/2ML IJ SOLN
50.0000 ug | Freq: Once | INTRAMUSCULAR | Status: AC
  Administered 2016-08-23: 50 ug via INTRAVENOUS

## 2016-08-23 MED ORDER — ONDANSETRON HCL 4 MG/2ML IJ SOLN
INTRAMUSCULAR | Status: DC | PRN
Start: 2016-08-23 — End: 2016-08-23
  Administered 2016-08-23: 4 mg via INTRAVENOUS

## 2016-08-23 MED ORDER — ROCURONIUM BROMIDE 100 MG/10ML IV SOLN
INTRAVENOUS | Status: DC | PRN
  Administered 2016-08-23: 30 mg via INTRAVENOUS
  Administered 2016-08-23 (×2): 20 mg via INTRAVENOUS
  Administered 2016-08-23: 30 mg via INTRAVENOUS

## 2016-08-23 MED ORDER — LACTATED RINGERS IV SOLN
INTRAVENOUS | Status: DC
  Administered 2016-08-23 (×2): via INTRAVENOUS

## 2016-08-23 MED ORDER — DEXAMETHASONE SODIUM PHOSPHATE 10 MG/ML IJ SOLN
INTRAMUSCULAR | Status: DC | PRN
  Administered 2016-08-23: 10 mg via INTRAVENOUS

## 2016-08-23 MED ORDER — FENTANYL CITRATE (PF) 100 MCG/2ML IJ SOLN
INTRAMUSCULAR | Status: DC | PRN
  Administered 2016-08-23 (×3): 50 ug via INTRAVENOUS

## 2016-08-23 MED ORDER — FAMOTIDINE 20 MG PO TABS
20.0000 mg | ORAL_TABLET | Freq: Once | ORAL | Status: AC
  Administered 2016-08-23: 20 mg via ORAL

## 2016-08-23 SURGICAL SUPPLY — 38 items
ADAPTER IRRIG TUBE 2 SPIKE SOL (ADAPTER) ×6 IMPLANT
BAG URO DRAIN 2000ML W/SPOUT (MISCELLANEOUS) ×3 IMPLANT
BAG URO DRAIN 4000ML (MISCELLANEOUS) IMPLANT
CATH FOL 2WAY LX 20X30 (CATHETERS) ×3 IMPLANT
CATH FOL LEG HOLDER (MISCELLANEOUS) ×3 IMPLANT
CATH FOLEY 3WAY 30CC 22FR (CATHETERS) IMPLANT
CATH URETL 5X70 OPEN END (CATHETERS) ×3 IMPLANT
CNTNR SPEC 2.5X3XGRAD LEK (MISCELLANEOUS)
CONT SPEC 4OZ STER OR WHT (MISCELLANEOUS)
CONTAINER COLLECT MORCELLATR (MISCELLANEOUS) ×1 IMPLANT
CONTAINER SPEC 2.5X3XGRAD LEK (MISCELLANEOUS) IMPLANT
DRAPE SHEET LG 3/4 BI-LAMINATE (DRAPES) ×3 IMPLANT
DRAPE UTILITY 15X26 TOWEL STRL (DRAPES) ×3 IMPLANT
FILTER OVERFLOW MORCELLATOR (FILTER) ×1 IMPLANT
GLOVE BIO SURGEON STRL SZ 6.5 (GLOVE) ×6 IMPLANT
GLOVE BIO SURGEONS STRL SZ 6.5 (GLOVE) ×3
GOWN STRL REUS W/ TWL LRG LVL3 (GOWN DISPOSABLE) ×2 IMPLANT
GOWN STRL REUS W/TWL LRG LVL3 (GOWN DISPOSABLE) ×4
KIT RM TURNOVER CYSTO AR (KITS) ×3 IMPLANT
LASER FIBER 550M SMARTSCOPE (Laser) ×3 IMPLANT
MORCELLATOR COLLECT CONTAINER (MISCELLANEOUS) ×3
MORCELLATOR OVERFLOW FILTER (FILTER) ×3
MORCELLATOR ROTATION 4.75 335 (MISCELLANEOUS) ×3 IMPLANT
PACK CYSTO AR (MISCELLANEOUS) ×3 IMPLANT
PREP PVP WINGED SPONGE (MISCELLANEOUS) IMPLANT
PUMP SINGLE ACTION SAP (PUMP) IMPLANT
SENSORWIRE 0.038 NOT ANGLED (WIRE) ×3
SET CYSTO W/LG BORE CLAMP LF (SET/KITS/TRAYS/PACK) ×3 IMPLANT
SET IRRIG Y TYPE TUR BLADDER L (SET/KITS/TRAYS/PACK) ×3 IMPLANT
SLEEVE PROTECTION STRL DISP (MISCELLANEOUS) ×6 IMPLANT
SOL .9 NS 3000ML IRR  AL (IV SOLUTION) ×20
SOL .9 NS 3000ML IRR UROMATIC (IV SOLUTION) ×10 IMPLANT
SYRINGE IRR TOOMEY STRL 70CC (SYRINGE) ×3 IMPLANT
TUBE PUMP MORCELLATOR PIRANHA (TUBING) ×3 IMPLANT
TUBING CONNECTING 10 (TUBING) IMPLANT
TUBING CONNECTING 10' (TUBING)
WATER STERILE IRR 1000ML POUR (IV SOLUTION) ×3 IMPLANT
WIRE SENSOR 0.038 NOT ANGLED (WIRE) ×1 IMPLANT

## 2016-08-23 NOTE — Discharge Instructions (Signed)
AMBULATORY SURGERY  °DISCHARGE INSTRUCTIONS ° ° °1) The drugs that you were given will stay in your system until tomorrow so for the next 24 hours you should not: ° °A) Drive an automobile °B) Make any legal decisions °C) Drink any alcoholic beverage ° ° °2) You may resume regular meals tomorrow.  Today it is better to start with liquids and gradually work up to solid foods. ° °You may eat anything you prefer, but it is better to start with liquids, then soup and crackers, and gradually work up to solid foods. ° ° °3) Please notify your doctor immediately if you have any unusual bleeding, trouble breathing, redness and pain at the surgery site, drainage, fever, or pain not relieved by medication. ° ° ° °4) Additional Instructions: ° ° ° ° ° ° ° °Please contact your physician with any problems or Same Day Surgery at [phone], Monday through Friday 6 am to 4 pm, or [place] [facility] [place] Main number at [phone]. °

## 2016-08-23 NOTE — Interval H&P Note (Signed)
History and Physical Interval Note:  08/23/2016 7:40 AM  Marvin Jefferson  has presented today for surgery, with the diagnosis of BPH WITH URINARY RETENTION  The various methods of treatment have been discussed with the patient and family. After consideration of risks, benefits and other options for treatment, the patient has consented to  Procedure(s): [place] WITH MORCELLATION (N/A) as a surgical intervention .  The patient's history has been reviewed, patient examined, no change in status, stable for surgery.  I have reviewed the patient's chart and labs.  Questions were answered to the patient's satisfaction.    RRR CTAB  Hollice Espy

## 2016-08-23 NOTE — Progress Notes (Signed)
Went over instructions with wife  Stated no questions   Catheter care explained

## 2016-08-23 NOTE — Anesthesia Postprocedure Evaluation (Signed)
Anesthesia Post Note  Patient: Marvin Jefferson  Procedure(s) Performed: Procedure(s) (LRB): 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 (N/A)  Patient location during evaluation: PACU Anesthesia Type: General Level of consciousness: awake and alert Pain management: pain level controlled Vital Signs Assessment: post-procedure vital signs reviewed and stable Respiratory status: spontaneous breathing, nonlabored ventilation, respiratory function stable and patient connected to nasal cannula oxygen Cardiovascular status: blood pressure returned to baseline and stable Postop Assessment: no signs of nausea or vomiting Anesthetic complications: no    Last Vitals:  Vitals:   08/23/16 1242 08/23/16 1244  BP: (!) 92/54 111/60  Pulse: 83 89  Resp:    Temp:      Last Pain:  Vitals:   08/23/16 1151  TempSrc:   PainSc: [place]

## 2016-08-23 NOTE — Progress Notes (Signed)
Catheter emptied of 300cc bloody urine   No clots noted

## 2016-08-23 NOTE — H&P (View-Only) (Signed)
07/27/2016 3:35 PM   Marvin Jefferson 1941/08/11 [id number]  Referring provider: Rusty Aus, MD [facility] [place], [place] [address]  Chief Complaint  Patient presents with  . Cysto    HPI: 75 year old Caucasian male with chronic urinary retention, massive BPH status post Foley catheter placement for 1 week for urinary decompression and has since been managed with clean intermittent catheterization. He returns to the office today for cystoscopy and discuss his options moving forward.  Patient states that several months ago he started to experience lower abdominal pain associated with lower back pain that was dull and cramping.  He was referred to GI by his PCP and a CT scan with contrast performed in 02/2016 noted mild pelvic caliectasis and bladder distention with significant enlargement of the prostate.  His was initiated on tamsulosin and the patient felt like his abdominal symptoms somewhat improved.   His PVR was 916 mL in the office upon initial urological evaluation and a Foley was placed on 07/15/16.  He returned last week for a voiding trial at which time his catheter was removed. He has been catheterizing himself since then 3 times a day.  He brings in a voiding diary today showing catheterization volumes of as high as close to 700 cc. He does occasionally have the urge to void but is unable to do so since his catheter was removed.   His major complaint prior to Foley frequency, urgency, leakage, hesitancy, straining and a weak stream.   He has had these symptoms for several months but perhaps longer.   No history of bladder stones or UTIs.  He was started on Flomax several weeks ago by his primary care physician in since been started on finasteride by [facility].   He states his father had his prostate removed when he was in his early 54's.  The patient is not certain if it was due to cancer.    Cr 1.0 on 02/2016.      PMH: Past Medical History:  Diagnosis Date  . Hypertension   . Umbilical hernia     Surgical History: Past Surgical History:  Procedure Laterality Date  . APPENDECTOMY      Home Medications:    Medication List       Accurate as of 07/27/16  3:35 PM. Always use your most recent med list.          acetaminophen 325 MG tablet Commonly known as:  TYLENOL Take by mouth.   ALPRAZolam 0.25 MG tablet Commonly known as:  XANAX Take by mouth.   amLODipine 5 MG tablet Commonly known as:  NORVASC   aspirin EC 81 MG tablet Take by mouth.   diclofenac 50 MG tablet Commonly known as:  CATAFLAM Take by mouth.   finasteride 5 MG tablet Commonly known as:  PROSCAR Take 1 tablet (5 mg total) by mouth daily.   FLUoxetine 10 MG tablet Commonly known as:  PROZAC Take by mouth.   fluticasone 50 MCG/ACT nasal spray Commonly known as:  FLONASE USE 2 SPRAYS IN EACH NOSTRIL EVERY DAY   galantamine 12 MG tablet Commonly known as:  RAZADYNE Take by mouth.   meclizine 25 MG tablet Commonly known as:  ANTIVERT TAKE ONE TABLET THREE TIMES A DAY AS NEEDED   tamsulosin 0.4 MG Caps capsule Commonly known as:  FLOMAX   terazosin 5 MG capsule Commonly known as:  HYTRIN Take by mouth.   traMADol 50  MG tablet Commonly known as:  ULTRAM Take by mouth.   V-R VITAMIN B-12 500 MCG tablet Generic drug:  cyanocobalamin Take by mouth.   Vitamin D3 2000 units capsule Take by mouth.   vitamin E 1000 UNIT capsule Take by mouth.       Allergies:  Allergies  Allergen Reactions  . Clarithromycin     Other reaction(s): Unknown    Family History: Family History  Problem Relation Age of Onset  . Prostate cancer Father   . Hypertension Father   . Stroke Father   . Hypertension Mother   . Dementia Mother   . Bladder Cancer Neg Hx   . Kidney cancer Neg Hx     Social History:  reports that he has quit smoking. He has never used smokeless tobacco. He reports that he  does not drink alcohol or use drugs.  Physical Exam: BP (!) 148/80   Pulse 88   Ht 5\' 7"  (1.702 m)   Wt 189 lb (85.7 kg)   BMI 29.60 kg/m   Constitutional: Well nourished. Alert and oriented, No acute distress.  Accompanied by his wife today. HEENT: [place] AT, moist mucus membranes. Trachea midline, no masses. Cardiovascular: No clubbing, cyanosis, or edema. Respiratory: Normal respiratory effort, no increased work of breathing. GI: Abdomen is soft, non tender, non distended, no abdominal masses.  GU: Uncircumcised phallus. Foreskin easily retracted Urethral meatus is patent.  Testicles descended bilaterally. Neurologic: Grossly intact, no focal deficits, moving all 4 extremities. Psychiatric: Normal mood and affect.  Laboratory Data:  Lab Results  Component Value Date   CREATININE 0.92 03/26/2013    PSA on 03/01/2016 was 1.93 ng/mL - stable   Pertinent Imaging: CLINICAL DATA:  Low back pain radiating to umbilical area for 1-2 months. History of diverticulosis.  EXAM: CT ABDOMEN AND PELVIS WITH CONTRAST  TECHNIQUE: Multidetector CT imaging of the abdomen and pelvis was performed using the standard protocol following bolus administration of intravenous contrast.  CONTRAST:  156mL ISOVUE-300 IOPAMIDOL (ISOVUE-300) INJECTION 61%  COMPARISON:  None.  FINDINGS: Lower chest: Lung bases are clear. No effusions. Heart is normal size.  Hepatobiliary: Small hypodensities scattered throughout the liver compatible with small cysts. No biliary ductal dilatation. Gallbladder unremarkable.  Pancreas: No focal abnormality or ductal dilatation.  Spleen: No focal abnormality.  Normal size.  Adrenals/Urinary Tract: Nodular fullness of the left adrenal gland, likely hyperplasia. Mild pelvicaliectasis bilaterally. Bladder is distended, likely related to the prostate enlargement and some degree of bladder outlet obstruction. No focal renal abnormality  or stones.  Stomach/Bowel: Stomach, large and small bowel grossly unremarkable. Appendix not identified. Question prior appendectomy. No inflammatory process in the right lower quadrant.  Vascular/Lymphatic: Scattered aortic and iliac calcifications. No aneurysm.  Reproductive: Prostate enlargement.  Other: No free fluid or free air.  Musculoskeletal: No acute bony abnormality. Degenerative changes throughout the lumbar spine.  IMPRESSION: Mild pelvic caliectasis and bladder distention. These findings are likely U related to some degree of bladder outlet obstruction with significant enlargement of the prostate.   Electronically Signed   By: Rolm Baptise M.D.   On: 03/09/2016 09:24    Ct personally reviewed today and with the patient- prostate volume calculated ~115 cc.  Assessment & Plan:  76 year old male with likely on standing bladder outlet obstruction, sequela appreciated today and cystoscopy including significant bladder trabeculation, large capacity bladder, an enlarged prostate.  Bladder currently being managed with clean intermittent catheterization, not spontaneously voiding. Maximal medical therapy with Flomax and finasteride  previously initiated.  At this point in time, I do feel that he would benefit from an outlet procedure as outlet obstruction is most likely cause of his chronic retention. We discussed that ideally, urodynamics could be pursued preop to evaluate his overall bladder function to assess bladder contractility, but given that he is not currently voiding, pressure flow study would not be possible.   Given the size of his gland, I would recommend either a simple prostatectomy versus holmium laser enucleation of the prostate. Risks and benefits of each were discussed. His most interested in a minimally invasive approach. We discussed risk and benefits in detail cutting risk of bleeding, retrograde ejaculation, damage to surrounding structures  including bladder injury, need for further procedures, failure of the procedure, stress incontinence, worsening of his irritative voiding symptoms amongst others. Postoperative course was also discussed. Given that he is in ongoing retention, we will likely leave his Foley in place x 7 days post op and may have to resume CIC.  All he and his wife's questions were answered today in detail.  1. Benign prostatic hyperplasia with urinary retention Schedule HoLEP Preop labs, UCx Continue CIC in the interim, increase frequency to qid given high bladder volumes - Urinalysis, Complete - ciprofloxacin (CIPRO) tablet 500 mg; Take 1 tablet (500 mg total) by mouth once. - lidocaine (XYLOCAINE) 2 % jelly 1 application; Place 1 application into the urethra once.  I spent 25 min with this patient of which greater than 50% was spent in counseling and coordination of care with the patient.  All questions were answered.  Discussion was lengthy today.   Hollice Espy, MD  [facility] [address] [place], [place] [address] [phone]

## 2016-08-23 NOTE — Anesthesia Preprocedure Evaluation (Addendum)
Anesthesia Evaluation  Patient identified by MRN, date of birth, ID band Patient awake    Reviewed: Allergy & Precautions, NPO status , Patient's Chart, lab work & pertinent test results, reviewed documented beta blocker date and time   Airway Mallampati: III  TM Distance: >3 FB     Dental  (+) Chipped   Pulmonary pneumonia, resolved, former smoker,           Cardiovascular hypertension, Pt. on medications      Neuro/Psych PSYCHIATRIC DISORDERS Anxiety Depression    GI/Hepatic   Endo/Other    Renal/GU      Musculoskeletal  (+) Arthritis ,   Abdominal   Peds  Hematology   Anesthesia Other Findings EKG ok.  Reproductive/Obstetrics                            Anesthesia Physical Anesthesia Plan  ASA: III  Anesthesia Plan: General   Post-op Pain Management:    Induction: Intravenous  Airway Management Planned: Oral ETT and LMA  Additional Equipment:   Intra-op Plan:   Post-operative Plan:   Informed Consent: I have reviewed the patients History and Physical, chart, labs and discussed the procedure including the risks, benefits and alternatives for the proposed anesthesia with the patient or authorized representative who has indicated his/her understanding and acceptance.     Plan Discussed with: CRNA  Anesthesia Plan Comments:         Anesthesia Quick Evaluation

## 2016-08-23 NOTE — Op Note (Signed)
Date of procedure: 08/23/16  Preoperative diagnosis:  1. BPH with urinary retention   Postoperative diagnosis:  1. same   Procedure: 1. Holmium laser enucleation of the prostate  Surgeon: Hollice Espy, MD  Anesthesia: General  Complications: None  Intraoperative findings: Trilobar coaptation with elevated bladder neck.  Moderate to severe bladder trabeculation. Trigone distorted from bladder neck.  EBL: 200 cc  Specimens: prostate chips  Drains: 20 Fr 2 way Foley with 30 cc balloon  Indication: Marvin Jefferson is a 75 y.o. patient with chronic urinary retention and BPH currently on clean intermittent catheterization. .  After reviewing the management options for treatment, he elected to proceed with the above surgical procedure(s). We have discussed the potential benefits and risks of the procedure, side effects of the proposed treatment, the likelihood of the patient achieving the goals of the procedure, and any potential problems that might occur during the procedure or recuperation. Informed consent has been obtained.  Description of procedure:  The patient was taken to the operating room and general anesthesia was induced.  The patient was placed in the dorsal lithotomy position, prepped and draped in the usual sterile fashion, and preoperative antibiotics were administered. A preoperative time-out was performed.   A 26 French resectoscope was advanced per urethra into the bladder using the blunt obturator without difficulty. The laser bridge and lens were brought in to carefully inspect the bladder which was moderate to have heavily trabeculated. The trigone was distorted to elevated bladder neck. There was significant intravesical protrusion of the trilobar lobes.  At this point in time, a 500  laser fiber was then brought in and using settings of 2 J and 50 Hz, 2 incisions were created starting at the bladder neck at the 8:00 position as well as the 4:00 position port were  carried down to me in the midline just above the verumontanum.  The median lobe was then enucleated in a caudal to cranial direction separating the adenoma from the underlying capsule. Once the lobe was hanging by the bladder neck, it was clear to push into the bladder.  Attention was then turned to enucleation of the left lateral lobe. A semilunar incision was made at the left prostatic apex just proximal to the verumontanum. A large portion of prostate on the side was again enucleated by separating the adenoma from the underlying capsule. This was able to be cleared from bladder neck and pushed into the bladder is well. Unfortunately, this lobe was not completely enucleated in one piece together in a piece wise fashion. A good amount of ablation was performed on this side as well as there were residual BPH nodules appreciated.  Finally, attention was turned to the and occlusion of the right lateral lobe. On the side, similar technique was used to enucleate this lateral lobe and one large piece. I was able to easily find the plane between the prostate and the adenoma which was again enucleated in a caudal to cranial direction and ultimately cleared from the bladder neck and pushed into the bladder. At this point in time, the prostatic fossa was noted to be widely patent. Reinspection of the trigone revealed bilateral UOs easily visualized.  Next, nephroscope was brought in and the prosthetic pieces were morcellated. Overall, there was an underwhelming amount of tissue. The bladder was emptied several times to ensure that there was no obvious residual pieces and none were identified. This point time, the scope was removed. A 20 French Foley catheter was placed with  30 cc in the balloon. This catheter advanced easily. 10 mg of IV Lasix was given to help facilitate diuresis. He was then cleaned and dried, repositioned the supine position, reversed from anesthesia, taken the PACU in stable condition.  Plan:  Patient will follow-up in 1 week for nursing visit for a voiding trial. He'll follow up with me in approximate 6 weeks. If he is unable to void, he will need to resume CIC. He will continue his prostate medications for now. No additional anticholinergic to prescribe today given his tendency for constipation.  Hollice Espy, M.D.

## 2016-08-23 NOTE — Anesthesia Procedure Notes (Signed)
Procedure Name: Intubation Date/Time: 08/23/2016 8:03 AM Performed by: Silvana Newness Pre-anesthesia Checklist: Patient identified, Emergency Drugs available, Suction available, Patient being monitored and Timeout performed Patient Re-evaluated:Patient Re-evaluated prior to inductionOxygen Delivery Method: Circle system utilized Preoxygenation: Pre-oxygenation with 100% oxygen Intubation Type: IV induction Ventilation: Mask ventilation without difficulty Laryngoscope Size: Mac and 4 Grade View: Grade I Tube type: Oral Tube size: 7.5 mm Number of attempts: 1 Airway Equipment and Method: Rigid stylet Placement Confirmation: ETT inserted through vocal cords under direct vision,  positive ETCO2 and breath sounds checked- equal and bilateral Secured at: 22 cm Tube secured with: Tape Dental Injury: Teeth and Oropharynx as per pre-operative assessment

## 2016-08-23 NOTE — Transfer of Care (Signed)
Immediate Anesthesia Transfer of Care Note  Patient: Marvin Jefferson  Procedure(s) Performed: Procedure(s): 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 (N/A)  Patient Location: PACU  Anesthesia Type:General  Level of Consciousness: awake, alert , oriented and patient cooperative  Airway & Oxygen Therapy: Patient Spontanous Breathing and Patient connected to face mask oxygen  Post-op Assessment: Report given to RN, Post -op Vital signs reviewed and stable and Patient moving all extremities X 4  Post vital signs: Reviewed and stable  Last Vitals:  Vitals:   08/23/16 0603 08/23/16 1033  BP: 136/88 133/79  Pulse: 65 76  Resp: 14 17  Temp: (!) 35.9 C (!) 36 C    Last Pain:  Vitals:   08/23/16 0713  TempSrc:   PainSc: 4          Complications: No apparent anesthesia complications

## 2016-08-24 ENCOUNTER — Telehealth: Payer: Self-pay

## 2016-08-24 LAB — SURGICAL PATHOLOGY

## 2016-08-24 NOTE — Telephone Encounter (Signed)
Due to urinary retention pt will cath four times daily indefinitely.

## 2016-08-24 NOTE — Telephone Encounter (Signed)
Pt called and left a message on the vm stating he was having trouble getting his urine to flow out of the catheter. Spoke with pt who stated he was able to fix the problem and is doing well at this point.

## 2016-08-25 ENCOUNTER — Encounter: Payer: Self-pay | Admitting: Urology

## 2016-08-30 ENCOUNTER — Encounter: Payer: Self-pay | Admitting: Urology

## 2016-08-30 ENCOUNTER — Ambulatory Visit (INDEPENDENT_AMBULATORY_CARE_PROVIDER_SITE_OTHER): Payer: Medicare Other

## 2016-08-30 VITALS — BP 127/84 | HR 80 | Ht 67.0 in | Wt 195.7 lb

## 2016-08-30 DIAGNOSIS — N401 Enlarged prostate with lower urinary tract symptoms: Secondary | ICD-10-CM | POA: Diagnosis not present

## 2016-08-30 DIAGNOSIS — R338 Other retention of urine: Secondary | ICD-10-CM | POA: Diagnosis not present

## 2016-08-30 NOTE — Progress Notes (Signed)
Fill and Pull Catheter Removal  Patient is present today for a catheter removal.  Patient was cleaned and prepped in a sterile fashion 274ml of sterile water/ saline was instilled into the bladder when the patient felt the urge to urinate. 53ml of water was then drained from the balloon.  A 20FR foley cath was removed from the bladder no complications were noted .  Patient as then given some time to void on their own.  Patient cannot void  after some time.  Patient tolerated well.  Preformed by: Toniann Fail, LPN   Follow up/ Additional notes: Pt was not able to void therefore a cath was provided for pt. Reinforced with pt to continue CIC until f/u appt with Dr. Erlene Quan. Pt voiced understanding.  Blood pressure 127/84, pulse 80, height 5\' 7"  (1.702 m), weight 195 lb 11.2 oz (88.8 kg).

## 2016-09-16 DIAGNOSIS — N138 Other obstructive and reflux uropathy: Secondary | ICD-10-CM | POA: Insufficient documentation

## 2016-09-16 DIAGNOSIS — N401 Enlarged prostate with lower urinary tract symptoms: Principal | ICD-10-CM | POA: Insufficient documentation

## 2016-09-16 DIAGNOSIS — I10 Essential (primary) hypertension: Secondary | ICD-10-CM | POA: Insufficient documentation

## 2016-09-16 DIAGNOSIS — Z0000 Encounter for general adult medical examination without abnormal findings: Secondary | ICD-10-CM | POA: Insufficient documentation

## 2016-09-19 ENCOUNTER — Encounter: Payer: Self-pay | Admitting: Urology

## 2016-09-20 ENCOUNTER — Encounter: Payer: Self-pay | Admitting: Urology

## 2016-09-20 ENCOUNTER — Ambulatory Visit (INDEPENDENT_AMBULATORY_CARE_PROVIDER_SITE_OTHER): Payer: Medicare Other

## 2016-09-20 VITALS — BP 127/84 | HR 73 | Temp 97.7°F | Wt 198.1 lb

## 2016-09-20 DIAGNOSIS — N390 Urinary tract infection, site not specified: Secondary | ICD-10-CM

## 2016-09-20 LAB — URINALYSIS, COMPLETE
Bilirubin, UA: POSITIVE — AB
Glucose, UA: NEGATIVE
Ketones, UA: NEGATIVE
Nitrite, UA: POSITIVE — AB
PH UA: 6 (ref 5.0–7.5)
Specific Gravity, UA: 1.015 (ref 1.005–1.030)
Urobilinogen, Ur: 0.2 mg/dL (ref 0.2–1.0)

## 2016-09-20 LAB — MICROSCOPIC EXAMINATION
EPITHELIAL CELLS (NON RENAL): NONE SEEN /HPF (ref 0–10)
WBC, UA: >30 /hpf — AB (ref 0–?)

## 2016-09-20 NOTE — Progress Notes (Signed)
Pt presents today with c/o slight dysuria, lower back pain, and lower abd pain. Pt has been on abx in the past 30 due to UTI prior to urological surgery. A clean catch urine was obtained for u/a and cx.   Blood pressure 127/84, pulse 73, temperature 97.7 F (36.5 C), weight 198 lb 1.6 oz (89.9 kg).

## 2016-09-21 ENCOUNTER — Telehealth: Payer: Self-pay

## 2016-09-21 DIAGNOSIS — N390 Urinary tract infection, site not specified: Secondary | ICD-10-CM

## 2016-09-21 MED ORDER — AMPICILLIN 500 MG PO CAPS: 500.0000 mg | ORAL_CAPSULE | Freq: Four times a day (QID) | ORAL | 0 refills | Status: DC

## 2016-09-21 NOTE — Telephone Encounter (Signed)
Spoke with pt in reference to abx. Made aware abx were sent to pharmacy. Pt voiced understanding.

## 2016-09-21 NOTE — Telephone Encounter (Signed)
-----   Message from Hollice Espy, MD sent at 09/20/2016  2:40 PM EST ----- Looks suspicious.  Lets go ahead and treat, ampicillin 500 mg q6 hours x 10 days.  Hollice Espy, MD

## 2016-09-27 LAB — CULTURE, URINE COMPREHENSIVE

## 2016-09-29 ENCOUNTER — Telehealth: Payer: Self-pay

## 2016-09-29 DIAGNOSIS — N390 Urinary tract infection, site not specified: Secondary | ICD-10-CM

## 2016-09-29 MED ORDER — SULFAMETHOXAZOLE-TRIMETHOPRIM 800-160 MG PO TABS: 1.0000 | ORAL_TABLET | Freq: Two times a day (BID) | ORAL | 0 refills | Status: AC

## 2016-09-29 NOTE — Telephone Encounter (Signed)
Spoke with pt in reference to +ucx. Made aware will need to add bactrim to abx therapy. Made aware abx sent to pharmacy. Pt voiced understanding.

## 2016-09-29 NOTE — Telephone Encounter (Signed)
-----   Message from Hollice Espy, MD sent at 09/29/2016  1:14 PM EST ----- It looks like his urine ended up growing 2 different bacterial organisms. The ampicillin covers the enterococcus, but he needs a second antibiotic to cover the gram-negative staph which may actually be a contaminant. As a precaution with his recurrent infections, I would recommend Bactrim DS twice a day 7 days.  Hollice Espy, MD

## 2016-10-08 ENCOUNTER — Ambulatory Visit: Payer: Medicare Other | Admitting: Urology

## 2016-10-08 ENCOUNTER — Encounter: Payer: Self-pay | Admitting: Urology

## 2016-10-08 VITALS — BP 121/77 | HR 70 | Ht 67.0 in | Wt 195.0 lb

## 2016-10-08 DIAGNOSIS — N390 Urinary tract infection, site not specified: Secondary | ICD-10-CM

## 2016-10-08 DIAGNOSIS — R339 Retention of urine, unspecified: Secondary | ICD-10-CM

## 2016-10-08 DIAGNOSIS — N138 Other obstructive and reflux uropathy: Secondary | ICD-10-CM | POA: Diagnosis not present

## 2016-10-08 DIAGNOSIS — R822 Biliuria: Secondary | ICD-10-CM

## 2016-10-08 DIAGNOSIS — N401 Enlarged prostate with lower urinary tract symptoms: Secondary | ICD-10-CM

## 2016-10-08 LAB — URINALYSIS, COMPLETE
Bilirubin, UA: POSITIVE — AB
GLUCOSE, UA: NEGATIVE
KETONES UA: NEGATIVE
NITRITE UA: NEGATIVE
SPEC GRAV UA: 1.015 (ref 1.005–1.030)
Urobilinogen, Ur: 0.2 mg/dL (ref 0.2–1.0)
pH, UA: 5.5 (ref 5.0–7.5)

## 2016-10-08 LAB — MICROSCOPIC EXAMINATION
Epithelial Cells (non renal): NONE SEEN /hpf (ref 0–10)
RBC, UA: NONE SEEN /hpf (ref 0–?)

## 2016-10-08 LAB — BLADDER SCAN AMB NON-IMAGING: Scan Result: 193

## 2016-10-08 NOTE — Progress Notes (Signed)
10/08/2016 10:05 AM   Marvin Jefferson 08/02/1941 [id number]  Referring provider: Rusty Aus, MD [facility] [place], [place] [address]  Chief Complaint  Patient presents with  . Urinary Retention    HPI: 75 year old Caucasian male with chronic urinary retention, massive BPH status s/p HoLEP on 08/23/16 who returns today for post op.   Surgical pathology, focal and acute inflammation.  12 g resection.  Treated for UTI, Enterococcus and coag negative staph since surgery, abx complete.    PVR today 193 cc.  He is now voiding on his own.  No urgency, frequency, or incontinence.  He was having dysuria up until yesterday.  He is no longer getting up at night voiding.  He does think that he is emptying his bladder despite having an elevated postvoid residual. Overall, he is pleased with the results. Prior to surgery, he was in florid retention.  Previous history: Prior to surgery, lower abdominal pain associated with lower back pain that was dull and cramping.  He was referred to GI by his PCP and a CT scan with contrast performed in 02/2016 noted mild pelvic caliectasis and bladder distention with significant enlargement of the prostate.  His was initiated on tamsulosin and the patient felt like his abdominal symptoms somewhat improved.   His PVR was 916 mL in the office upon initial urological evaluation and a Foley was placed on 07/15/16.  He returned last week for a voiding trial at which time his catheter was removed. He has been catheterizing himself since then 3 times a day.     He states his father had his prostate removed when he was in his early 98's.  The patient is not certain if it was due to cancer.     Cr 0.94 on 07/2016.    PSA on 02/2016 on 1.93 ng/mL.    TRUS volume 115 cc preop  Currently on terazosin 5 mg bid which was originally prescribed for blood pressure management.   PMH: Past Medical History:  Diagnosis  Date  . Anxiety   . DDD (degenerative disc disease), cervical   . DDD (degenerative disc disease), lumbar   . Dementia   . Depression   . Hypertension   . Pneumonia 2015  . Umbilical hernia     Surgical History: Past Surgical History:  Procedure Laterality Date  . APPENDECTOMY    . GANGLION CYST EXCISION Right    wrist  . HERNIA REPAIR     umbilical hernia  . 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 N/A 08/23/2016   Procedure: 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;  Surgeon: Hollice Espy, MD;  Location: [facility] ORS;  Service: Urology;  Laterality: N/A;    Home Medications:  Allergies as of 10/08/2016      Reactions   Clarithromycin Other (See Comments)   Terrible taste and smell      Medication List       Accurate as of 10/08/16 10:05 AM. Always use your most recent med list.          acetaminophen 500 MG tablet Commonly known as:  TYLENOL Take 500 mg by mouth every 6 (six) hours as needed for mild pain.   ALPRAZolam 0.25 MG tablet Commonly known as:  XANAX Take 0.25 mg by mouth 2 (two) times daily as needed for sleep.   amLODipine 5 MG tablet Commonly known as:  NORVASC Take 5 mg by mouth 2 (two) times  daily.   aspirin EC 81 MG tablet Take 81 mg by mouth daily.   diclofenac 50 MG tablet Commonly known as:  CATAFLAM Take 50 mg by mouth 2 (two) times daily as needed.   docusate sodium 100 MG capsule Commonly known as:  COLACE Take 100 mg by mouth 2 (two) times daily.   etodolac 400 MG tablet Commonly known as:  LODINE Take by mouth.   Fish Oil 1000 MG Caps Take 1,000 mg by mouth daily.   FLUoxetine 10 MG tablet Commonly known as:  PROZAC Take 20 mg by mouth daily.   fluticasone 50 MCG/ACT nasal spray Commonly known as:  FLONASE USE 2 SPRAYS IN EACH NOSTRIL EVERY DAY AS NEEDED FOR ALLERGIES   galantamine 12 MG tablet Commonly known as:  RAZADYNE Take 12 mg by mouth 2 (two) times daily.   GLUCOSAMINE CHOND  MSM FORMULA PO Take 1 tablet by mouth daily.   multivitamin with minerals tablet Take 1 tablet by mouth daily.   terazosin 5 MG capsule Commonly known as:  HYTRIN Take 5 mg by mouth 2 (two) times daily.   traMADol 50 MG tablet Commonly known as:  ULTRAM Take 50 mg by mouth every 8 (eight) hours as needed for moderate pain.   V-R VITAMIN B-12 500 MCG tablet Generic drug:  cyanocobalamin Take 500 mcg by mouth 2 (two) times daily.   Vitamin D3 2000 units capsule Take 2,000 Units by mouth daily.   vitamin E 1000 UNIT capsule Take 1,000 Units by mouth daily.       Allergies:  Allergies  Allergen Reactions  . Clarithromycin Other (See Comments)    Terrible taste and smell    Family History: Family History  Problem Relation Age of Onset  . Prostate cancer Father   . Hypertension Father   . Stroke Father   . Hypertension Mother   . Dementia Mother   . Bladder Cancer Neg Hx   . Kidney cancer Neg Hx     Social History:  reports that he quit smoking about 54 years ago. He has never used smokeless tobacco. He reports that he does not drink alcohol or use drugs.  ROS: UROLOGY Frequent Urination?: No Hard to postpone urination?: No Burning/pain with urination?: No Get up at night to urinate?: No Leakage of urine?: No Urine stream starts and stops?: No Trouble starting stream?: No Do you have to strain to urinate?: No Blood in urine?: No Urinary tract infection?: No Sexually transmitted disease?: No Injury to kidneys or bladder?: No Painful intercourse?: No Weak stream?: No Erection problems?: No Penile pain?: No  Gastrointestinal Nausea?: No Vomiting?: No Indigestion/heartburn?: No Diarrhea?: No Constipation?: No  Constitutional Fever: No Night sweats?: No Weight loss?: No Fatigue?: No  Skin Skin rash/lesions?: No Itching?: No  Eyes Blurred vision?: No Double vision?: No  Ears/Nose/Throat Sore throat?: No Sinus problems?:  No  Hematologic/Lymphatic Swollen glands?: No Easy bruising?: No  Cardiovascular Leg swelling?: No Chest pain?: No  Respiratory Cough?: No Shortness of breath?: No  Endocrine Excessive thirst?: No  Musculoskeletal Back pain?: Yes Joint pain?: Yes  Neurological Headaches?: No Dizziness?: No  Psychologic Depression?: No Anxiety?: No  Physical Exam: BP 121/77 (BP Location: Left Arm, Patient Position: Sitting, Cuff Size: Large)   Pulse 70   Ht 5\' 7"  (1.702 m)   Wt 195 lb (88.5 kg)   BMI 30.54 kg/m   Constitutional: Well nourished. Alert and oriented, No acute distress. HEENT: [place] AT, moist mucus membranes. Trachea  midline, no masses. Cardiovascular: No clubbing, cyanosis, or edema. Respiratory: Normal respiratory effort, no increased work of breathing. GI: Abdomen is soft, non tender, non distended, no abdominal masses. No suprapubic tenderness.   Skin: No rashes, bruises or suspicious lesions. Neurologic: Grossly intact, no focal deficits, moving all 4 extremities. Psychiatric: Normal mood and affect.  Laboratory Data:  Lab Results  Component Value Date   CREATININE 0.94 08/02/2016    PSA on 03/01/2016 was 1.93 ng/mL - stable    Pertinent Imaging: Results for orders placed or performed in visit on 10/08/16  BLADDER SCAN AMB NON-IMAGING  Result Value Ref Range   Scan Result 193     UA reviewed, see epic,  persistent white blood cells and red blood cells in specimen, 3+ bilirubin, 2+ protein  Assessment & Plan:    1. BPH with obstruction/lower urinary tract symptoms S/p HoLEP 11/217 Clinically pleased with the result, in longstanding retention preop In the setting of incomplete bladder emptying, I have encouraged him to continue the terazosin both for blood pressure and for BPH purposes - Urinalysis, Complete - BLADDER SCAN AMB NON-IMAGING - Comprehensive metabolic panel - CULTURE, URINE COMPREHENSIVE  2. Recurrent UTI UA suspicious again today,  we'll recheck urine culture and treat if become symptomatic  3. 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   Hollice Espy, MD  [facility] [address], [place], [place] [address] [phone]

## 2016-10-09 LAB — COMPREHENSIVE METABOLIC PANEL
ALT: 23 IU/L (ref 0–44)
AST: 30 IU/L (ref 0–40)
Albumin/Globulin Ratio: 1.8 (ref 1.2–2.2)
Albumin: 4.4 g/dL (ref 3.5–4.8)
Alkaline Phosphatase: 111 IU/L (ref 39–117)
BUN/Creatinine Ratio: 14 (ref 10–24)
BUN: 16 mg/dL (ref 8–27)
Bilirubin Total: 0.8 mg/dL (ref 0.0–1.2)
CO2: 24 mmol/L (ref 18–29)
Calcium: 9 mg/dL (ref 8.6–10.2)
Chloride: 102 mmol/L (ref 96–106)
Creatinine, Ser: 1.11 mg/dL (ref 0.76–1.27)
GFR calc Af Amer: 75 mL/min/{1.73_m2} (ref >59)
GFR calc non Af Amer: 65 mL/min/{1.73_m2} (ref >59)
Globulin, Total: 2.5 g/dL (ref 1.5–4.5)
Glucose: 89 mg/dL (ref 65–99)
Potassium: 4.3 mmol/L (ref 3.5–5.2)
Sodium: 139 mmol/L (ref 134–144)
Total Protein: 6.9 g/dL (ref 6.0–8.5)

## 2016-10-11 LAB — CULTURE, URINE COMPREHENSIVE

## 2016-10-19 ENCOUNTER — Telehealth: Payer: Self-pay

## 2016-10-19 ENCOUNTER — Encounter: Payer: Self-pay | Admitting: Urology

## 2016-10-19 DIAGNOSIS — N390 Urinary tract infection, site not specified: Secondary | ICD-10-CM

## 2016-10-19 MED ORDER — NITROFURANTOIN MONOHYD MACRO 100 MG PO CAPS: 100.0000 mg | ORAL_CAPSULE | Freq: Two times a day (BID) | ORAL | 0 refills | Status: AC

## 2016-10-19 NOTE — Telephone Encounter (Signed)
Macrobid bid x 10 days.  Hollice Espy, MD

## 2016-10-19 NOTE — Telephone Encounter (Signed)
Spoke with pt in reference to abx. Made aware sent to pharmacy. Pt voiced understanding.

## 2016-10-19 NOTE — Telephone Encounter (Signed)
Pt sent a mychart message stating he is having UTI like s/s again. Ucx results came back from 12/22. Please advise.

## 2016-11-01 ENCOUNTER — Encounter: Payer: Self-pay | Admitting: Urology

## 2016-11-08 ENCOUNTER — Telehealth: Payer: Self-pay

## 2016-11-08 ENCOUNTER — Ambulatory Visit (INDEPENDENT_AMBULATORY_CARE_PROVIDER_SITE_OTHER): Payer: Medicare Other

## 2016-11-08 ENCOUNTER — Encounter: Payer: Self-pay | Admitting: Urology

## 2016-11-08 VITALS — BP 148/89 | HR 78 | Ht 67.0 in | Wt 196.0 lb

## 2016-11-08 DIAGNOSIS — N390 Urinary tract infection, site not specified: Secondary | ICD-10-CM

## 2016-11-08 LAB — URINALYSIS, COMPLETE
BILIRUBIN UA: POSITIVE — AB
GLUCOSE, UA: NEGATIVE
KETONES UA: NEGATIVE
Nitrite, UA: NEGATIVE
SPEC GRAV UA: 1.025 (ref 1.005–1.030)
Urobilinogen, Ur: 0.2 mg/dL (ref 0.2–1.0)
pH, UA: 5.5 (ref 5.0–7.5)

## 2016-11-08 LAB — MICROSCOPIC EXAMINATION
EPITHELIAL CELLS (NON RENAL): NONE SEEN /HPF (ref 0–10)
WBC, UA: >30 /hpf — AB (ref 0–?)

## 2016-11-08 NOTE — Telephone Encounter (Signed)
-----   Message from Hollice Espy, MD sent at 11/08/2016  1:41 PM EST ----- Make sure this gets sent for culture.  Hollice Espy, MD

## 2016-11-08 NOTE — Telephone Encounter (Signed)
Orders placed.

## 2016-11-08 NOTE — Progress Notes (Signed)
Pt presented today for u/a post abx therapy. Pt c/o lower abd pain. Pt described pain to be an aching feeling that goes away as the day goes on.   Blood pressure (!) 148/89, pulse 78, height 5\' 7"  (1.702 m), weight 196 lb (88.9 kg).

## 2016-11-08 NOTE — Addendum Note (Signed)
Addended by: Toniann Fail C on: 11/08/2016 01:50 PM   Modules accepted: Orders

## 2016-11-10 LAB — CULTURE, URINE COMPREHENSIVE

## 2016-11-11 ENCOUNTER — Emergency Department
Admission: EM | Admit: 2016-11-11 | Discharge: 2016-11-11 | Disposition: A | Discharge status: Home / Self Care | Payer: Medicare Other | Attending: Emergency Medicine | Admitting: Emergency Medicine

## 2016-11-11 ENCOUNTER — Encounter: Payer: Self-pay | Admitting: Urology

## 2016-11-11 ENCOUNTER — Encounter: Payer: Self-pay | Admitting: *Deleted

## 2016-11-11 ENCOUNTER — Telehealth: Payer: Self-pay

## 2016-11-11 ENCOUNTER — Emergency Department: Payer: Medicare Other

## 2016-11-11 DIAGNOSIS — Z7982 Long term (current) use of aspirin: Secondary | ICD-10-CM | POA: Insufficient documentation

## 2016-11-11 DIAGNOSIS — Y999 Unspecified external cause status: Secondary | ICD-10-CM | POA: Diagnosis not present

## 2016-11-11 DIAGNOSIS — W268XXA Contact with other sharp object(s), not elsewhere classified, initial encounter: Secondary | ICD-10-CM | POA: Diagnosis not present

## 2016-11-11 DIAGNOSIS — S61411A Laceration without foreign body of right hand, initial encounter: Secondary | ICD-10-CM

## 2016-11-11 DIAGNOSIS — Z79899 Other long term (current) drug therapy: Secondary | ICD-10-CM | POA: Insufficient documentation

## 2016-11-11 DIAGNOSIS — Z87891 Personal history of nicotine dependence: Secondary | ICD-10-CM | POA: Diagnosis not present

## 2016-11-11 DIAGNOSIS — Y929 Unspecified place or not applicable: Secondary | ICD-10-CM | POA: Insufficient documentation

## 2016-11-11 DIAGNOSIS — Y9389 Activity, other specified: Secondary | ICD-10-CM | POA: Diagnosis not present

## 2016-11-11 DIAGNOSIS — I10 Essential (primary) hypertension: Secondary | ICD-10-CM | POA: Diagnosis not present

## 2016-11-11 MED ORDER — OXYCODONE-ACETAMINOPHEN 5-325 MG PO TABS: 2.0000 | ORAL_TABLET | Freq: Four times a day (QID) | ORAL | 0 refills | Status: DC | PRN

## 2016-11-11 MED ORDER — LIDOCAINE-EPINEPHRINE (PF) 2 %-1:200000 IJ SOLN
10.0000 mL | Freq: Once | INTRAMUSCULAR | Status: DC
  Filled 2016-11-11: qty 10

## 2016-11-11 NOTE — Telephone Encounter (Signed)
When pt was in the office last to give a specimen he c/o lower abd discomfort that gradually gets better as the day goes on. Pt inquired about trying CIC in the morning when he got up to see if it helped with his discomfort. Reinforced with pt could try for a couple of days and see if it helps. Please advise.  TheseThese are the results of the three morning experiment;  6:00am Tues 1/23  natural void=286ml  cath=38ml  -----------------  7:15am wed 1/24  natural void=176ml  cath=235ml  --------------------  7:30am thu 1/25  natural void=119ml  cath=278ml  ----------------  The cathing did not provide immediate relief but the pain did go away much quicker than previously.

## 2016-11-11 NOTE — ED Provider Notes (Signed)
[facility] Emergency Department Provider Note        Time seen: ----------------------------------------- 1:09 PM on 11/11/2016 -----------------------------------------    I have reviewed the triage vital signs and the nursing notes.   HISTORY  Chief Complaint Laceration    HPI Marvin Jefferson is a 76 y.o. male who presents to the ER for a laceration sustained to the right hand. Patient states he was trying to helping and near over a vanity and it broke, sustaining a large laceration to the right hand. Patient states tetanus status up-to-date. Pain is 5 out of 10, nothing makes it better or worse. Patient describes profuse bleeding from the wound.   Past Medical History:  Diagnosis Date  . Anxiety   . DDD (degenerative disc disease), cervical   . DDD (degenerative disc disease), lumbar   . Dementia   . Depression   . Hypertension   . Pneumonia 2015  . Umbilical hernia     Patient Active Problem List   Diagnosis Date Noted  . Benign essential hypertension 09/16/2016  . BPH with obstruction/lower urinary tract symptoms 09/16/2016  . Medicare annual wellness visit, initial 09/16/2016  . Lumbar disc disease 02/28/2014  . Meniere's disease 02/28/2014  . AR (allergic rhinitis) 02/27/2014  . Cervical disc disease 02/27/2014  . HTN (hypertension) 02/27/2014  . Late onset Alzheimer's disease without behavioral disturbance 02/27/2014  . OA (osteoarthritis) 02/27/2014    Past Surgical History:  Procedure Laterality Date  . APPENDECTOMY    . GANGLION CYST EXCISION Right    wrist  . HERNIA REPAIR     umbilical hernia  . 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 N/A 08/23/2016   Procedure: 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;  Surgeon: Hollice Espy, MD;  Location: [facility] ORS;  Service: Urology;  Laterality: N/A;    Allergies Clarithromycin  Social History Social History  Substance Use Topics  . Smoking  status: Former Smoker    Quit date: 08/02/1962  . Smokeless tobacco: Never Used  . Alcohol use No    Review of Systems Constitutional: Negative for fever. Cardiovascular: Negative for chest pain. Musculoskeletal: Positive right hand pain Skin: Positive for right hand laceration Neurological: Negative for headaches, focal weakness or numbness.  10-point ROS otherwise negative.  ____________________________________________   PHYSICAL EXAM:  VITAL SIGNS: ED Triage Vitals  Enc Vitals Group     BP 11/11/16 1135 126/74     Pulse Rate 11/11/16 1135 66     Resp 11/11/16 1135 18     Temp 11/11/16 1135 98 F (36.7 C)     Temp Source 11/11/16 1135 Oral     SpO2 11/11/16 1135 96 %     Weight 11/11/16 1136 196 lb (88.9 kg)     Height 11/11/16 1136 5\' 7"  (1.702 m)     Head Circumference --      Peak Flow --      Pain Score 11/11/16 1136 5     Pain Loc --      Pain Edu? --      Excl. in [place]? --     Constitutional: Alert and oriented. Anxious, mild distress Eyes: Conjunctivae are normal. Normal extraocular movements. Musculoskeletal: 4.5 cm laceration noted to the dorsal thenar area on the right hand Neurologic:  Normal speech and language. No gross focal neurologic deficits are appreciated.  Skin:  4.5 cm laceration that is deep noted to the right hand dorsolaterally Psychiatric: Mood and affect are normal. Speech and  behavior are normal.  ____________________________________________  ED COURSE:  Pertinent labs & imaging results that were available during my care of the patient were reviewed by me and considered in my medical decision making (see chart for details). Patient presents to the ER for right hand laceration, brisk bleeding is noted. He will require laceration repair.   Marland Kitchen.Laceration Repair Date/Time: 11/11/2016 1:11 PM Performed by: Earleen Newport Authorized by: Lenise Arena E   Consent:    Consent obtained:  Verbal Anesthesia (see MAR for exact  dosages):    Anesthesia method:  Local infiltration   Local anesthetic:  Lidocaine 1% WITH epi Laceration details:    Location:  Hand   Hand location:  R hand, dorsum   Length (cm):  4.5   Depth (mm):  10 Repair type:    Repair type:  Intermediate Pre-procedure details:    Preparation:  Patient was prepped and draped in usual sterile fashion Exploration:    Hemostasis achieved with:  Epinephrine   Contaminated: no   Treatment:    Area cleansed with:  Betadine   Amount of cleaning:  Standard   Irrigation solution:  Sterile saline   Irrigation method:  Syringe   Visualized foreign bodies/material removed: no   Skin repair:    Repair method:  Sutures   Suture size:  4-0   Suture material:  Prolene   Number of sutures:  8 Approximation:    Approximation:  Close   Vermilion border: well-aligned   Post-procedure details:    Patient tolerance of procedure:  Tolerated well, no immediate complications    ____________________________________________  FINAL ASSESSMENT AND PLAN  Right hand laceration  Plan: Patient presented to the ER after sustaining a laceration to the right hand. The wound was bleeding briskly, good hemostatic result after wound closure. I will advise follow-up in 2 days for wound check.   Earleen Newport, MD   Note: This note was generated in part or whole with voice recognition software. Voice recognition is usually quite accurate but there are transcription errors that can and very often do occur. I apologize for any typographical errors that were not detected and corrected.     Earleen Newport, MD 11/11/16 [phone]

## 2016-11-11 NOTE — ED Triage Notes (Addendum)
Pt arrives with laceration to right wrist area, EDP at bedside, states he was trying to hang a new mirror over his vanity and it broke, states tetanus status is up to date

## 2016-11-11 NOTE — ED Notes (Signed)
Dressing removed from laceration and skin cleansed.  Wound well approximated, oozing moderate amount sanguineous drainage.  Pressure dressing reapplied.  Continue to monitor.

## 2016-11-11 NOTE — ED Notes (Signed)
AAOx3.  Skin warm and dry.  NAD 

## 2016-11-15 MED ORDER — AMPICILLIN 500 MG PO CAPS: 500.0000 mg | ORAL_CAPSULE | Freq: Four times a day (QID) | ORAL | 0 refills | Status: DC

## 2016-11-15 NOTE — Telephone Encounter (Signed)
App has been and patient is aware  Marvin Jefferson

## 2016-11-15 NOTE — Telephone Encounter (Signed)
Patient continues to have voiding symptoms and difficulty in reducing his catheter into his bladder. He is also having early morning bladder discomfort/spasms.  Urine culture growing enterococcus sensitive to penicillin. We'll prescribe a longer course of ampicillin. I recommended follow up next week with cystoscopy to evaluate his prostatic anatomy and also rule out any retained tissue in his bladder which may be contributing to his recurrent infection/ongoing symptoms.  Hollice Espy, MD

## 2016-11-25 ENCOUNTER — Encounter: Payer: Self-pay | Admitting: Urology

## 2016-11-25 ENCOUNTER — Ambulatory Visit: Payer: Medicare Other | Admitting: Urology

## 2016-11-25 VITALS — BP 147/86 | HR 77 | Ht 67.0 in | Wt 194.0 lb

## 2016-11-25 DIAGNOSIS — N401 Enlarged prostate with lower urinary tract symptoms: Secondary | ICD-10-CM | POA: Diagnosis not present

## 2016-11-25 DIAGNOSIS — N138 Other obstructive and reflux uropathy: Secondary | ICD-10-CM | POA: Diagnosis not present

## 2016-11-25 DIAGNOSIS — N390 Urinary tract infection, site not specified: Secondary | ICD-10-CM

## 2016-11-25 DIAGNOSIS — R339 Retention of urine, unspecified: Secondary | ICD-10-CM

## 2016-11-25 LAB — URINALYSIS, COMPLETE
BILIRUBIN UA: POSITIVE — AB
Glucose, UA: NEGATIVE
KETONES UA: NEGATIVE
NITRITE UA: NEGATIVE
Protein, UA: NEGATIVE
SPEC GRAV UA: 1.01 (ref 1.005–1.030)
Urobilinogen, Ur: 0.2 mg/dL (ref 0.2–1.0)
pH, UA: 5.5 (ref 5.0–7.5)

## 2016-11-25 LAB — MICROSCOPIC EXAMINATION
Bacteria, UA: NONE SEEN
EPITHELIAL CELLS (NON RENAL): NONE SEEN /HPF (ref 0–10)

## 2016-11-25 MED ORDER — CIPROFLOXACIN HCL 500 MG PO TABS: 500.0000 mg | ORAL_TABLET | Freq: Once | ORAL | Status: DC

## 2016-11-25 MED ORDER — LIDOCAINE HCL 2 % EX GEL: 1.0000 "application " | Freq: Once | CUTANEOUS | Status: DC

## 2016-11-25 NOTE — Progress Notes (Signed)
   11/25/16  CC:  Chief Complaint  Patient presents with  . Cysto    HPI: 76 yo M with history of chronic urinary retention, massive BPH status post whole lab on 08/23/2016.  Since surgery, he's had recurrent urinary tract infections and need for clean intermittent catheterization. He also notes that when catheterizing himself, he meets resistance near the level of this bladder neck. Given his ongoing symptoms, he was counseled to come in today for cystoscopy, evaluation of his prostatic fossa as well as for any residual tissue within his bladder which may be contributing to ongoing symptoms.  Blood pressure (!) 147/86, pulse 77, height 5\' 7"  (1.702 m), weight 194 lb (88 kg). NED. A&Ox3.   No respiratory distress   Abd soft, NT, ND Normal phallus with bilateral descended testicles  Cystoscopy Procedure Note  Patient identification was confirmed, informed consent was obtained, and patient was prepped using Betadine solution.  Lidocaine jelly was administered per urethral meatus.    Preoperative abx where received prior to procedure.     Pre-Procedure: - Inspection reveals a normal caliber ureteral meatus.  Procedure: The flexible cystoscope was introduced without difficulty - No urethral strictures/lesions are present. - Prostatic fossa is now well re-mucosalized is fairly patent with without significant coaptation.  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    Post-Procedure: - Patient tolerated the procedure well  Assessment/ Plan:  1. 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 I counseled the patient to return to the operating room for a redo holmium laser enucleation of the prostate. In reality, this will consist of cleaving this mucosal bridge at the bladder neck and morcellation of this adenoma.  Overall, the surgery should be much quicker and less tramatic than his previous procedure. Risks and benefits were reviewed again today detail. This includes risk of bleeding, infection, damage to surrounding structures, urinary leakage, stricture, ejaculatory dysfunction amongst others. He'll require Foley catheter for some time postop. All of his questions were answered. He is agreed to proceed as planned. - Urinalysis, Complete - ciprofloxacin (CIPRO) tablet 500 mg; Take 1 tablet (500 mg total) by mouth once. - lidocaine (XYLOCAINE) 2 % jelly 1 application; Place 1 application into the urethra once. - CULTURE, URINE COMPREHENSIVE  2. Recurrent UTI Likely 2/2 #1 Will start on low dose abx until surgery after this course complete   3. Incomplete bladder emptying As above He understands that this procedure may or may not completely resolve his inability to completely empty his bladder, however, will certainly help with self catheter and possibly recurrent infections   Hollice Espy, MD

## 2016-11-26 ENCOUNTER — Telehealth: Payer: Self-pay | Admitting: Radiology

## 2016-11-26 ENCOUNTER — Other Ambulatory Visit: Payer: Self-pay | Admitting: Radiology

## 2016-11-26 DIAGNOSIS — N138 Other obstructive and reflux uropathy: Secondary | ICD-10-CM

## 2016-11-26 DIAGNOSIS — N401 Enlarged prostate with lower urinary tract symptoms: Principal | ICD-10-CM

## 2016-11-26 MED ORDER — AMPICILLIN 500 MG PO CAPS: 500.0000 mg | ORAL_CAPSULE | Freq: Two times a day (BID) | ORAL | 0 refills | Status: DC

## 2016-11-26 NOTE — Telephone Encounter (Signed)
Pt aware of script sent to pharmacy.

## 2016-11-26 NOTE — Telephone Encounter (Signed)
Pt states he was supposed to start an antibiotic prior to surgery scheduled 12/06/16 but nothing was called into pharmacy. Please advise.

## 2016-11-28 LAB — CULTURE, URINE COMPREHENSIVE

## 2016-11-29 ENCOUNTER — Other Ambulatory Visit: Payer: Self-pay | Admitting: Urology

## 2016-11-29 ENCOUNTER — Encounter
Admission: RE | Admit: 2016-11-29 | Discharge: 2016-11-29 | Disposition: A | Discharge status: Home / Self Care | Payer: Medicare Other | Source: Ambulatory Visit | Attending: Urology | Admitting: Urology

## 2016-11-29 ENCOUNTER — Encounter: Payer: Self-pay | Admitting: Urology

## 2016-11-29 DIAGNOSIS — I10 Essential (primary) hypertension: Secondary | ICD-10-CM | POA: Diagnosis not present

## 2016-11-29 DIAGNOSIS — F0280 Dementia in other diseases classified elsewhere without behavioral disturbance: Secondary | ICD-10-CM | POA: Diagnosis not present

## 2016-11-29 DIAGNOSIS — Z01812 Encounter for preprocedural laboratory examination: Secondary | ICD-10-CM | POA: Diagnosis present

## 2016-11-29 DIAGNOSIS — N138 Other obstructive and reflux uropathy: Secondary | ICD-10-CM | POA: Insufficient documentation

## 2016-11-29 DIAGNOSIS — G301 Alzheimer's disease with late onset: Secondary | ICD-10-CM | POA: Diagnosis not present

## 2016-11-29 DIAGNOSIS — M5090 Cervical disc disorder, unspecified, unspecified cervical region: Secondary | ICD-10-CM | POA: Insufficient documentation

## 2016-11-29 HISTORY — DX: Meniere's disease, unspecified ear: H81.09

## 2016-11-29 HISTORY — DX: Other chronic pain: G89.29

## 2016-11-29 HISTORY — DX: Urinary tract infection, site not specified: N39.0

## 2016-11-29 HISTORY — DX: Dorsalgia, unspecified: M54.9

## 2016-11-29 LAB — SURGICAL PCR SCREEN
MRSA, PCR: NEGATIVE
STAPHYLOCOCCUS AUREUS: POSITIVE — AB

## 2016-11-29 MED ORDER — TAMSULOSIN HCL 0.4 MG PO CAPS
0.4000 mg | ORAL_CAPSULE | Freq: Every day | ORAL | 11 refills | Status: DC
Start: 2016-11-29 — End: 2016-12-06

## 2016-11-29 NOTE — Patient Instructions (Signed)
  Your procedure is scheduled on: 12/06/16 Mon Report to Same Day Surgery 2nd floor medical mall [facility] Entrance-take elevator on left to 2nd floor.  Check in with surgery information desk.) To find out your arrival time please call [phone] between 1PM - 3PM on 12/03/16 Fri  Remember: Instructions that are not followed completely may result in serious medical risk, up to and including death, or upon the discretion of your surgeon and anesthesiologist your surgery may need to be rescheduled.    _x___ 1. Do not eat food or drink liquids after midnight. No gum chewing or hard candies.     __x__ 2. No Alcohol for 24 hours before or after surgery.   __x__3. No Smoking for 24 prior to surgery.   ____  4. Bring all medications with you on the day of surgery if instructed.    __x__ 5. Notify your doctor if there is any change in your medical condition     (cold, fever, infections).     Do not wear jewelry, make-up, hairpins, clips or nail polish.  Do not wear lotions, powders, or perfumes. You may wear deodorant.  Do not shave 48 hours prior to surgery. Men may shave face and neck.  Do not bring valuables to the hospital.    [facility] is not responsible for any belongings or valuables.               Contacts, dentures or bridgework may not be worn into surgery.  Leave your suitcase in the car. After surgery it may be brought to your room.  For patients admitted to the hospital, discharge time is determined by your treatment team.   Patients discharged the day of surgery will not be allowed to drive home.  You will need someone to drive you home and stay with you the night of your procedure.    Please read over the following fact sheets that you were given:   [facility] Preparing for Surgery and or MRSA Information   _x___ Take these medicines the morning of surgery with A SIP OF WATER:    1. amLODipine (NORVASC  2.FLUoxetine (PROZAC  3.terazosin (HYTRIN  4.traMADol (ULTRAM  if needed  5.  6.  ____Fleets enema or Magnesium Citrate as directed.   _x___ Use CHG Soap or sage wipes as directed on instruction sheet   ____ Use inhalers on the day of surgery and bring to hospital day of surgery  ____ Stop metformin 2 days prior to surgery    ____ Take 1/2 of usual insulin dose the night before surgery and none on the morning of           surgery.   __x__ Stop Aspirin, Coumadin, Pllavix ,Eliquis, Effient, or Pradaxa  Stopped Aspirin yersterday  x__ Stop Anti-inflammatories such as Advil, Aleve, Ibuprofen, Motrin, Naproxen,          Naprosyn, Goodies powders or aspirin products. Ok to take Tylenol. Stop Lodine today.   _x___ Stop supplements until after surgery.  Stop Vitamin E and fish oil today.  ____ Bring C-Pap to the hospital.

## 2016-12-06 ENCOUNTER — Encounter
Admission: RE | Disposition: A | Discharge status: Home / Self Care | Payer: Self-pay | Source: Ambulatory Visit | Attending: Urology

## 2016-12-06 ENCOUNTER — Ambulatory Visit: Payer: Medicare Other | Admitting: Anesthesiology

## 2016-12-06 ENCOUNTER — Encounter: Payer: Self-pay | Admitting: *Deleted

## 2016-12-06 ENCOUNTER — Ambulatory Visit
Admission: RE | Admit: 2016-12-06 | Discharge: 2016-12-06 | Disposition: A | Discharge status: Home / Self Care | Payer: Medicare Other | Source: Ambulatory Visit | Attending: Urology | Admitting: Urology

## 2016-12-06 DIAGNOSIS — R338 Other retention of urine: Secondary | ICD-10-CM | POA: Insufficient documentation

## 2016-12-06 DIAGNOSIS — I10 Essential (primary) hypertension: Secondary | ICD-10-CM | POA: Insufficient documentation

## 2016-12-06 DIAGNOSIS — N401 Enlarged prostate with lower urinary tract symptoms: Secondary | ICD-10-CM | POA: Diagnosis not present

## 2016-12-06 DIAGNOSIS — F329 Major depressive disorder, single episode, unspecified: Secondary | ICD-10-CM | POA: Insufficient documentation

## 2016-12-06 DIAGNOSIS — N138 Other obstructive and reflux uropathy: Secondary | ICD-10-CM | POA: Insufficient documentation

## 2016-12-06 DIAGNOSIS — Z87891 Personal history of nicotine dependence: Secondary | ICD-10-CM | POA: Insufficient documentation

## 2016-12-06 DIAGNOSIS — N320 Bladder-neck obstruction: Secondary | ICD-10-CM | POA: Insufficient documentation

## 2016-12-06 DIAGNOSIS — Z87440 Personal history of urinary (tract) infections: Secondary | ICD-10-CM | POA: Insufficient documentation

## 2016-12-06 DIAGNOSIS — F419 Anxiety disorder, unspecified: Secondary | ICD-10-CM | POA: Diagnosis not present

## 2016-12-06 DIAGNOSIS — M1990 Unspecified osteoarthritis, unspecified site: Secondary | ICD-10-CM | POA: Insufficient documentation

## 2016-12-06 HISTORY — PX: 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: SHX6641

## 2016-12-06 SURGERY — ENUCLEATION, PROSTATE, USING LASER, WITH MORCELLATION
Anesthesia: General | Wound class: Clean Contaminated

## 2016-12-06 MED ORDER — FAMOTIDINE 20 MG PO TABS
ORAL_TABLET | ORAL | Status: AC
  Filled 2016-12-06: qty 1

## 2016-12-06 MED ORDER — ONDANSETRON HCL 4 MG/2ML IJ SOLN: 4.0000 mg | Freq: Once | INTRAMUSCULAR | Status: DC | PRN

## 2016-12-06 MED ORDER — LACTATED RINGERS IV SOLN
INTRAVENOUS | Status: DC
  Administered 2016-12-06: 08:00:00 via INTRAVENOUS

## 2016-12-06 MED ORDER — MIDAZOLAM HCL 2 MG/2ML IJ SOLN
INTRAMUSCULAR | Status: DC | PRN
  Administered 2016-12-06: 2 mg via INTRAVENOUS

## 2016-12-06 MED ORDER — HYDROCODONE-ACETAMINOPHEN 5-325 MG PO TABS: 1.0000 | ORAL_TABLET | Freq: Four times a day (QID) | ORAL | 0 refills | Status: DC | PRN

## 2016-12-06 MED ORDER — SODIUM CHLORIDE 0.9 % IV SOLN
INTRAVENOUS | Status: AC
  Filled 2016-12-06: qty 1000

## 2016-12-06 MED ORDER — LACTATED RINGERS IV SOLN
INTRAVENOUS | Status: DC | PRN
  Administered 2016-12-06: 10:00:00 via INTRAVENOUS

## 2016-12-06 MED ORDER — FENTANYL CITRATE (PF) 100 MCG/2ML IJ SOLN
INTRAMUSCULAR | Status: AC
  Filled 2016-12-06: qty 2

## 2016-12-06 MED ORDER — GENTAMICIN IN SALINE 1.6-0.9 MG/ML-% IV SOLN
80.0000 mg | INTRAVENOUS | Status: DC
  Filled 2016-12-06: qty 50

## 2016-12-06 MED ORDER — SODIUM CHLORIDE 0.9 % IV SOLN
1.0000 g | INTRAVENOUS | Status: AC
  Administered 2016-12-06: 1 g via INTRAVENOUS

## 2016-12-06 MED ORDER — FENTANYL CITRATE (PF) 100 MCG/2ML IJ SOLN: 25.0000 ug | INTRAMUSCULAR | Status: DC | PRN

## 2016-12-06 MED ORDER — SUCCINYLCHOLINE CHLORIDE 20 MG/ML IJ SOLN
INTRAMUSCULAR | Status: DC | PRN
  Administered 2016-12-06: 100 mg via INTRAVENOUS

## 2016-12-06 MED ORDER — LIDOCAINE HCL (CARDIAC) 20 MG/ML IV SOLN
INTRAVENOUS | Status: DC | PRN
  Administered 2016-12-06: 50 mg via INTRAVENOUS

## 2016-12-06 MED ORDER — PROPOFOL 10 MG/ML IV BOLUS
INTRAVENOUS | Status: DC | PRN
  Administered 2016-12-06: 160 mg via INTRAVENOUS

## 2016-12-06 MED ORDER — GLYCOPYRROLATE 0.2 MG/ML IJ SOLN
INTRAMUSCULAR | Status: DC | PRN
  Administered 2016-12-06: 0.4 mg via INTRAVENOUS

## 2016-12-06 MED ORDER — FAMOTIDINE 20 MG PO TABS
20.0000 mg | ORAL_TABLET | Freq: Once | ORAL | Status: AC
  Administered 2016-12-06: 20 mg via ORAL

## 2016-12-06 MED ORDER — EPHEDRINE SULFATE 50 MG/ML IJ SOLN
INTRAMUSCULAR | Status: DC | PRN
  Administered 2016-12-06 (×2): 10 mg via INTRAVENOUS

## 2016-12-06 MED ORDER — FENTANYL CITRATE (PF) 100 MCG/2ML IJ SOLN
INTRAMUSCULAR | Status: DC | PRN
  Administered 2016-12-06: 50 ug via INTRAVENOUS

## 2016-12-06 MED ORDER — MIDAZOLAM HCL 2 MG/2ML IJ SOLN
INTRAMUSCULAR | Status: AC
  Filled 2016-12-06: qty 2

## 2016-12-06 SURGICAL SUPPLY — 33 items
ADAPTER IRRIG TUBE 2 SPIKE SOL (ADAPTER) ×6 IMPLANT
BAG URO DRAIN 2000ML W/SPOUT (MISCELLANEOUS) ×3 IMPLANT
BAG URO DRAIN 4000ML (MISCELLANEOUS) ×3 IMPLANT
CATH FOL 2WAY LX 18X30 (CATHETERS) ×3 IMPLANT
CATH FOL 2WAY LX 20X30 (CATHETERS) IMPLANT
CATH FOL 2WAY LX 22X30 (CATHETERS) IMPLANT
CATH FOL LEG HOLDER (MISCELLANEOUS) ×3 IMPLANT
CATH FOLEY 3WAY 30CC 22FR (CATHETERS) IMPLANT
CATH URETL 5X70 OPEN END (CATHETERS) ×3 IMPLANT
CONTAINER COLLECT MORCELLATR (MISCELLANEOUS) IMPLANT
DRAPE SHEET LG 3/4 BI-LAMINATE (DRAPES) ×3 IMPLANT
DRAPE UTILITY 15X26 TOWEL STRL (DRAPES) IMPLANT
FILTER OVERFLOW MORCELLATOR (FILTER) IMPLANT
GLOVE BIO SURGEON STRL SZ 6.5 (GLOVE) ×4 IMPLANT
GLOVE BIO SURGEONS STRL SZ 6.5 (GLOVE) ×2
GOWN STRL REUS W/ TWL LRG LVL3 (GOWN DISPOSABLE) ×2 IMPLANT
GOWN STRL REUS W/TWL LRG LVL3 (GOWN DISPOSABLE) ×4
KIT RM TURNOVER CYSTO AR (KITS) ×3 IMPLANT
LASER FIBER 550M SMARTSCOPE (Laser) ×3 IMPLANT
MORCELLATOR COLLECT CONTAINER (MISCELLANEOUS)
MORCELLATOR OVERFLOW FILTER (FILTER)
MORCELLATOR ROTATION 4.75 335 (MISCELLANEOUS) IMPLANT
PACK CYSTO AR (MISCELLANEOUS) ×3 IMPLANT
SENSORWIRE 0.038 NOT ANGLED (WIRE) ×3
SET CYSTO W/LG BORE CLAMP LF (SET/KITS/TRAYS/PACK) IMPLANT
SET IRRIG Y TYPE TUR BLADDER L (SET/KITS/TRAYS/PACK) ×3 IMPLANT
SLEEVE PROTECTION STRL DISP (MISCELLANEOUS) ×6 IMPLANT
SOL .9 NS 3000ML IRR  AL (IV SOLUTION) ×8
SOL .9 NS 3000ML IRR UROMATIC (IV SOLUTION) ×4 IMPLANT
SYRINGE IRR TOOMEY STRL 70CC (SYRINGE) ×3 IMPLANT
TUBE PUMP MORCELLATOR PIRANHA (TUBING) IMPLANT
WATER STERILE IRR 1000ML POUR (IV SOLUTION) ×3 IMPLANT
WIRE SENSOR 0.038 NOT ANGLED (WIRE) ×1 IMPLANT

## 2016-12-06 NOTE — Op Note (Signed)
Date of procedure: 12/06/16  Preoperative diagnosis:  1. BPH with bladder outlet obstruction 2. Recurrent UTIs   Postoperative diagnosis:  1. Same as above   Procedure: 1. Holmium laser enucleation of the prostate (without morcellation)  Surgeon: Hollice Espy, MD  Anesthesia: General  Complications: None  Intraoperative findings: Large right lateral lobe hanging by a mucosal bridge at the 11:00 position ball valving into the bladder neck. Otherwise, prostatic fossa widely patent.   EBL: minimal  Specimens: prostate lobe  Drains: 18 Fr 2-way Foley  Indication: Marvin Jefferson is a 76 y.o. patient with history of BPH, urinary retention who previously underwent holmium laser enucleation of the prostate in 08/2016. Since then, he had difficulty voiding, difficulty cathetering due to obstruction of the bladder neck and recurrent UTIs. He is found to have persistent right lateral lobe attached by a mucosal bridge causing obstruction of the bladder neck. He was counseled to return for resection of this..  After reviewing the management options for treatment, he elected to proceed with the above surgical procedure(s). We have discussed the potential benefits and risks of the procedure, side effects of the proposed treatment, the likelihood of the patient achieving the goals of the procedure, and any potential problems that might occur during the procedure or recuperation. Informed consent has been obtained.  Description of procedure:  The patient was taken to the operating room and general anesthesia was induced.  The patient was placed in the dorsal lithotomy position, prepped and draped in the usual sterile fashion, and preoperative antibiotics were administered. A preoperative time-out was performed.   A 26 French resectoscope was introduced using a blunt obturator into the bladder without difficulty. Clear yellow urine was drained. This time with 30 lens and a laser bridge was brought  in. Upon inspection of the bladder, it was noted to be moderate to heavily trabeculated with a widely open bladder neck which was not elevated. The trigone was in normal anatomic positions and each UO was able to be visualized without any pathology identified. The prostatic fossa itself was relatively short without significant coapting lateral lobes. At the 11:00 position, there was a large attached right lateral lobe likely residual from previous holmium laser enucleation of the prostate with a mucosal bridge. This is creating a ball valve effect into the bladder neck. At this point time, 550  laser fiber using the settings of 2 J and 50 Hz was cleaved to free this lateral lobe freeing it into the bladder. There is minimal bleeding appreciated. The laser was then used to ablate some left lateral lobe tissue but quickly cut down to the level of the capsule therefore no additional ablation was deemed necessary. This point time, using teased forceps, the large circular lobe was grasped and ultimately able to be brought out through the urethra en bloc without the need for morcellation. This was sent off for pathologic specimen. The bladder was reinspected and there was no residual chips noted. Bleeding was minimal. The scope was then removed. A 18 French two-way Foley catheter was then advanced per urethra into the bladder. The balloon was filled with 30 cc of sterile water. The patient was then cleaned and dried, repositioned the supine position, reversed from anesthesia, taken to the PACU in stable condition.  Plan: Patient will remove his own catheter in 3 days. He will begin voiding spontaneously but I have advised him to self catheter at least once daily after voiding to monitor his postvoid residuals. He will follow  up in 6 weeks.  Hollice Espy, M.D.

## 2016-12-06 NOTE — Interval H&P Note (Signed)
History and Physical Interval Note:  12/06/2016 9:02 AM  Marvin Jefferson  has presented today for surgery, with the diagnosis of bph with obstruction/LUTS  The various methods of treatment have been discussed with the patient and family. After consideration of risks, benefits and other options for treatment, the patient has consented to  Procedure(s): [place] WITH MORCELLATION (N/A) as a surgical intervention .  The patient's history has been reviewed, patient examined, no change in status, stable for surgery.  I have reviewed the patient's chart and labs.  Questions were answered to the patient's satisfaction.    RRR CTAB  Past Medical History:  Diagnosis Date  . Anxiety   . Chronic back pain   . DDD (degenerative disc disease), cervical   . DDD (degenerative disc disease), lumbar   . Dementia   . Depression   . Hypertension   . Meniere's disease   . Pneumonia 2015  . Umbilical hernia   . UTI (urinary tract infection)    Current Facility-Administered Medications for the 12/06/16 encounter [facility] Encounter)  Medication  . ciprofloxacin (CIPRO) tablet 500 mg  . lidocaine (XYLOCAINE) 2 % jelly 1 application   Current Meds  Medication Sig  . acetaminophen (TYLENOL) 500 MG tablet Take 1,000 mg by mouth every 6 (six) hours as needed for mild pain.   Marland Kitchen ALPRAZolam (XANAX) 0.25 MG tablet Take 0.25 mg by mouth 2 (two) times daily as needed (dizziness).   Marland Kitchen amLODipine (NORVASC) 5 MG tablet Take 5 mg by mouth 2 (two) times daily.   Marland Kitchen ampicillin (PRINCIPEN) 500 MG capsule Take 1 capsule (500 mg total) by mouth 2 (two) times daily.  Marland Kitchen aspirin EC 81 MG tablet Take 81 mg by mouth daily.   . Cholecalciferol (VITAMIN D3) 2000 units capsule Take 2,000 Units by mouth daily.   . cyanocobalamin (V-R VITAMIN B-12) 500 MCG tablet Take 500 mcg by mouth 2 (two) times daily.   Marland Kitchen docusate sodium (COLACE) 100 MG capsule Take 100 mg by mouth daily.   Marland Kitchen etodolac (LODINE)  400 MG tablet Take 400 mg by mouth daily.   Marland Kitchen FLUoxetine (PROZAC) 20 MG tablet Take 20 mg by mouth daily.  . fluticasone (FLONASE) 50 MCG/ACT nasal spray USE 2 SPRAYS IN EACH NOSTRIL EVERY DAY AS NEEDED FOR ALLERGIES  . galantamine (RAZADYNE) 12 MG tablet Take 12 mg by mouth 2 (two) times daily.   . Glucosamine HCl 1500 MG TABS Take 1 tablet by mouth 2 (two) times daily.  . hydrocortisone cream 1 % Apply 1 application topically daily as needed for itching.  . meclizine (ANTIVERT) 25 MG tablet Take 25 mg by mouth 3 (three) times daily as needed for dizziness.  . Melatonin 10 MG TABS Take 10 m by mouth at bedtime as needed (sleep).  . Melatonin 5 MG CAPS Take 1 capsule by mouth at bedtime.  . methocarbamol (ROBAXIN) 500 MG tablet Take 500 mg by mouth 3 (three) times daily as needed for muscle spasms.  . Multiple Vitamins-Minerals (MULTIVITAMIN WITH MINERALS) tablet Take 1 tablet by mouth daily.  Marland Kitchen neomycin-bacitracin-polymyxin (NEOSPORIN) ointment Apply 1 application topically daily as needed for wound care. apply to eye  . Omega-3 Fatty Acids (FISH OIL) 1000 MG CAPS Take 1,000 mg by mouth daily.   . sodium chloride (OCEAN) 0.65 % SOLN nasal spray Place 2 sprays into both nostrils 2 (two) times daily as needed for congestion.  Marland Kitchen terazosin (HYTRIN) 5 MG capsule Take 5 mg  by mouth 2 (two) times daily.   . traMADol (ULTRAM) 50 MG tablet Take 50 mg by mouth every 6 (six) hours as needed for moderate pain.   . vitamin E 1000 UNIT capsule Take 1,000 Units by mouth daily.   . [DISCONTINUED] tamsulosin (FLOMAX) 0.4 MG CAPS capsule Take 0.4 mg by mouth daily after breakfast.   Allergies  Allergen Reactions  . Clarithromycin Other (See Comments)    Terrible taste and smell    Hollice Espy

## 2016-12-06 NOTE — Discharge Instructions (Signed)
You have a Foley which I would like for you to keep until Thursday AM.  On this day, use the provided syringe and remove Foley (30 cc in balloon).  You may void spontaneously until then and catheterize at least once daily after voiding, record volumes.  If volumes low, you may stop self cathing.  Call or message our office for guidance as needed.    Hollice Espy, MD          Foley Catheter Care, Adult A Foley catheter is a soft, flexible tube. This tube is placed into your bladder to drain pee (urine). If you go home with this catheter in place, follow the instructions below. TAKING CARE OF THE CATHETER 1. Wash your hands with soap and water. 2. Put soap and water on a clean washcloth.  Clean the skin where the tube goes into your body.  Clean away from the tube site.  Never wipe toward the tube.  Clean the area using a circular motion.  Remove all the soap. Pat the area dry with a clean towel. For males, reposition the skin that covers the end of the penis (foreskin). 3. Attach the tube to your leg with tape or a leg strap. Do not stretch the tube tight. If you are using tape, remove any stickiness left behind by past tape you used. 4. Keep the drainage bag below your hips. Keep it off the floor. 5. Check your tube during the day. Make sure it is working and draining. Make sure the tube does not curl, twist, or bend. 6. Do not pull on the tube or try to take it out.  TAKING CARE OF THE DRAINAGE BAGS You will have a large overnight drainage bag and a small leg bag. You may wear the overnight bag any time. Never wear the small bag at night. Follow the directions below. Emptying the Drainage Bag  Empty your drainage bag when it is ?- full or at least 2-3 times a day. 1. Wash your hands with soap and water. 2. Keep the drainage bag below your hips. 3. Hold the dirty bag over the toilet or clean container. 4. Open the pour spout at the bottom of the bag. Empty the pee into the  toilet or container. Do not let the pour spout touch anything. 5. Clean the pour spout with a gauze pad or cotton ball that has rubbing alcohol on it. 6. Close the pour spout. 7. Attach the bag to your leg with tape or a leg strap. 8. Wash your hands well.  PREVENT INFECTION  Wash your hands before and after touching your tube.  Take showers every day. Wash the skin where the tube enters your body. Do not take baths. Replace wet leg straps with dry ones, if this applies.  Do not use powders, sprays, or lotions on the genital area. Only use creams, lotions, or ointments as told by your doctor.  Drink enough fluids to keep your pee clear or pale yellow unless you are told not to have too much fluid (fluid restriction).  Do not let the drainage bag or tubing touch or lie on the floor.  Wear cotton underwear to keep the area dry. GET HELP IF:  Your pee is cloudy or smells unusually bad.  Your tube becomes clogged.  You are not draining pee into the bag or your bladder feels full.  Your tube starts to leak. GET HELP RIGHT AWAY IF:  You have pain, puffiness (swelling), redness, or yellowish-white  fluid (pus) where the tube enters the body.  You have pain in the belly (abdomen), legs, lower back, or bladder.  You have a fever.  You see blood fill the tube, or your pee is pink or red.  You feel sick to your stomach (nauseous), throw up (vomit), or have chills.  Your tube gets pulled out.   Prostate Laser Surgery, Care After This sheet gives you information about how to care for yourself after your procedure. Your health care provider may also give you more specific instructions. If you have problems or questions, contact your health care provider. What can I expect after the procedure? For the first few weeks after the procedure:  You will feel a need to urinate often.  You may have blood in your urine.  You may feel a sudden need to urinate. Once your urinary catheter is  removed, you may have a burning feeling when you urinate, especially at the end of urination. This feeling usually passes within 3-5 days. Follow these instructions at home: Activity  Do not do vigorous exercise for 1 week or as told by your health care provider.  Do not lift anything that is heavier than 10 lb (4.5 kg) until your health care provider say it is safe.  Avoid sexual activity for 4-6 weeks or as told by your health care provider.  Do not ride in a car for extended periods of time for 1 month or as told by your health care provider.  Do not drive for 24 hours if you were given a medicine to help you relax (sedative). Diet  Eat foods that are high in fiber, such as fresh fruits and vegetables, whole grains, and beans.  Drink enough fluid to keep your urine clear or pale yellow. Medicines  Take over-the-counter and prescription medicines, including stool softeners, only as told by your health care provider.  If you were prescribed an antibiotic medicine, take it as told by your health care provider. Do not stop taking the antibiotic even if you start to feel better. General instructions  Do not strain to have a bowel movement. Straining may lead to bleeding from the prostate and cause clots to form and cause trouble urinating.  Keep all follow-up visits as told by your health care provider. This is important. Contact a health care provider if:  You have a fever or chills.  You have spasms or pain with the urinary catheter still in place.  Once the catheter has been removed, you experience difficulty starting your stream when attempting to urinate. Get help right away if:  There is a blockage in your catheter.  Your catheter has been removed and you are suddenly unable to urinate.  Your urine smells unusually bad.  You start to have blood clots in your urine.  The blood in your urine becomes persistent or gets thick.  You develop chest pains.  You develop  shortness of breath.  You develop swelling or pain in your leg. General Anesthesia, Adult, Care After These instructions provide you with information about caring for yourself after your procedure. Your health care provider may also give you more specific instructions. Your treatment has been planned according to current medical practices, but problems sometimes occur. Call your health care provider if you have any problems or questions after your procedure. What can I expect after the procedure? After the procedure, it is common to have: Vomiting. A sore throat. Mental slowness. It is common to feel: Nauseous. Cold or  shivery. Sleepy. Tired. Sore or achy, even in parts of your body where you did not have surgery. Follow these instructions at home: For at least 24 hours after the procedure: Do not: Participate in activities where you could fall or become injured. Drive. Use heavy machinery. Drink alcohol. Take sleeping pills or medicines that cause drowsiness. Make important decisions or sign legal documents. Take care of children on your own. Rest. Eating and drinking If you vomit, drink water, juice, or soup when you can drink without vomiting. Drink enough fluid to keep your urine clear or pale yellow. Make sure you have little or no nausea before eating solid foods. Follow the diet recommended by your health care provider. General instructions Have a responsible adult stay with you until you are awake and alert. Return to your normal activities as told by your health care provider. Ask your health care provider what activities are safe for you. Take over-the-counter and prescription medicines only as told by your health care provider. If you smoke, do not smoke without supervision. Keep all follow-up visits as told by your health care provider. This is important. Contact a health care provider if: You continue to have nausea or vomiting at home, and medicines are not  helpful. You cannot drink fluids or start eating again. You cannot urinate after 8-12 hours. You develop a skin rash. You have fever. You have increasing redness at the site of your procedure. Get help right away if: You have difficulty breathing. You have chest pain. You have unexpected bleeding. You feel that you are having a life-threatening or urgent problem. This information is not intended to replace advice given to you by your health care provider. Make sure you discuss any questions you have with your health care provider. Document Released: 01/10/2001 Document Revised: 03/08/2016 Document Reviewed: 09/18/2015 Elsevier Interactive Patient Education  2017 [employer].

## 2016-12-06 NOTE — Transfer of Care (Signed)
Immediate Anesthesia Transfer of Care Note  Patient: Marvin Jefferson  Procedure(s) Performed: Procedure(s): 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 (N/A)  Patient Location: PACU  Anesthesia Type:General  Level of Consciousness: awake, alert  and oriented  Airway & Oxygen Therapy: Patient Spontanous Breathing and Patient connected to face mask oxygen  Post-op Assessment: Report given to RN  Post vital signs: Reviewed and stable  Last Vitals:  Vitals:   12/06/16 0823 12/06/16 1046  BP: 133/81 126/89  Pulse: 72 (!) 111  Resp: 18 (!) 30  Temp: 36.4 C 37 C    Last Pain:  Vitals:   12/06/16 0823  TempSrc: Oral  PainSc: 2          Complications: No apparent anesthesia complications

## 2016-12-06 NOTE — Anesthesia Post-op Follow-up Note (Cosign Needed)
Anesthesia QCDR form completed.        

## 2016-12-06 NOTE — H&P (View-Only) (Signed)
   11/25/16  CC:  Chief Complaint  Patient presents with  . Cysto    HPI: 76 yo M with history of chronic urinary retention, massive BPH status post whole lab on 08/23/2016.  Since surgery, he's had recurrent urinary tract infections and need for clean intermittent catheterization. He also notes that when catheterizing himself, he meets resistance near the level of this bladder neck. Given his ongoing symptoms, he was counseled to come in today for cystoscopy, evaluation of his prostatic fossa as well as for any residual tissue within his bladder which may be contributing to ongoing symptoms.  Blood pressure (!) 147/86, pulse 77, height 5\' 7"  (1.702 m), weight 194 lb (88 kg). NED. A&Ox3.   No respiratory distress   Abd soft, NT, ND Normal phallus with bilateral descended testicles  Cystoscopy Procedure Note  Patient identification was confirmed, informed consent was obtained, and patient was prepped using Betadine solution.  Lidocaine jelly was administered per urethral meatus.    Preoperative abx where received prior to procedure.     Pre-Procedure: - Inspection reveals a normal caliber ureteral meatus.  Procedure: The flexible cystoscope was introduced without difficulty - No urethral strictures/lesions are present. - Prostatic fossa is now well re-mucosalized is fairly patent with without significant coaptation.  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    Post-Procedure: - Patient tolerated the procedure well  Assessment/ Plan:  1. 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 I counseled the patient to return to the operating room for a redo holmium laser enucleation of the prostate. In reality, this will consist of cleaving this mucosal bridge at the bladder neck and morcellation of this adenoma.  Overall, the surgery should be much quicker and less tramatic than his previous procedure. Risks and benefits were reviewed again today detail. This includes risk of bleeding, infection, damage to surrounding structures, urinary leakage, stricture, ejaculatory dysfunction amongst others. He'll require Foley catheter for some time postop. All of his questions were answered. He is agreed to proceed as planned. - Urinalysis, Complete - ciprofloxacin (CIPRO) tablet 500 mg; Take 1 tablet (500 mg total) by mouth once. - lidocaine (XYLOCAINE) 2 % jelly 1 application; Place 1 application into the urethra once. - CULTURE, URINE COMPREHENSIVE  2. Recurrent UTI Likely 2/2 #1 Will start on low dose abx until surgery after this course complete   3. Incomplete bladder emptying As above He understands that this procedure may or may not completely resolve his inability to completely empty his bladder, however, will certainly help with self catheter and possibly recurrent infections   Hollice Espy, MD

## 2016-12-06 NOTE — Anesthesia Preprocedure Evaluation (Addendum)
Anesthesia Evaluation  Patient identified by MRN, date of birth, ID band Patient awake    Reviewed: Allergy & Precautions, NPO status , Patient's Chart, lab work & pertinent test results, reviewed documented beta blocker date and time   Airway Mallampati: III  TM Distance: >3 FB     Dental  (+) Chipped   Pulmonary pneumonia, resolved, former smoker,    Pulmonary exam normal        Cardiovascular hypertension, Pt. on medications Normal cardiovascular exam     Neuro/Psych PSYCHIATRIC DISORDERS Anxiety Depression    GI/Hepatic negative GI ROS, Neg liver ROS,   Endo/Other  negative endocrine ROS  Renal/GU negative Renal ROS  negative genitourinary   Musculoskeletal  (+) Arthritis , Osteoarthritis,    Abdominal Normal abdominal exam  (+)   Peds negative pediatric ROS (+)  Hematology negative hematology ROS (+)   Anesthesia Other Findings EKG ok.  Reproductive/Obstetrics negative OB ROS                             Anesthesia Physical  Anesthesia Plan  ASA: III  Anesthesia Plan: General   Post-op Pain Management:    Induction: Intravenous  Airway Management Planned: Oral ETT  Additional Equipment:   Intra-op Plan:   Post-operative Plan:   Informed Consent: I have reviewed the patients History and Physical, chart, labs and discussed the procedure including the risks, benefits and alternatives for the proposed anesthesia with the patient or authorized representative who has indicated his/her understanding and acceptance.   Dental advisory given  Plan Discussed with: CRNA and Surgeon  Anesthesia Plan Comments:        Anesthesia Quick Evaluation

## 2016-12-06 NOTE — Anesthesia Postprocedure Evaluation (Signed)
Anesthesia Post Note  Patient: Marvin Jefferson  Procedure(s) Performed: Procedure(s) (LRB): 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 (N/A)  Patient location during evaluation: PACU Anesthesia Type: General Level of consciousness: awake and alert and oriented Pain management: pain level controlled Vital Signs Assessment: post-procedure vital signs reviewed and stable Respiratory status: spontaneous breathing Cardiovascular status: blood pressure returned to baseline Anesthetic complications: no     Last Vitals:  Vitals:   12/06/16 1123 12/06/16 1150  BP: 131/86 119/69  Pulse: 88 79  Resp: 17 17  Temp: 36.5 C     Last Pain:  Vitals:   12/06/16 1123  TempSrc: Temporal  PainSc:                  [place]

## 2016-12-06 NOTE — Anesthesia Procedure Notes (Signed)
Procedure Name: Intubation Date/Time: 12/06/2016 9:48 AM Performed by: DVVOHYW, Paxtyn Wisdom Pre-anesthesia Checklist: Patient identified, Emergency Drugs available, Suction available, Timeout performed and Patient being monitored Patient Re-evaluated:Patient Re-evaluated prior to inductionOxygen Delivery Method: Circle system utilized Preoxygenation: Pre-oxygenation with 100% oxygen Intubation Type: IV induction Laryngoscope Size: Mac and 4 Grade View: Grade I Tube type: Oral Tube size: 7.5 mm Number of attempts: 1 Secured at: 23 cm Tube secured with: Tape

## 2016-12-07 ENCOUNTER — Encounter: Payer: Self-pay | Admitting: Urology

## 2016-12-07 LAB — SURGICAL PATHOLOGY

## 2016-12-13 ENCOUNTER — Encounter: Payer: Self-pay | Admitting: Urology

## 2016-12-20 ENCOUNTER — Ambulatory Visit (INDEPENDENT_AMBULATORY_CARE_PROVIDER_SITE_OTHER): Payer: Medicare Other

## 2016-12-20 VITALS — BP 162/100 | HR 73 | Ht 67.0 in | Wt 194.6 lb

## 2016-12-20 DIAGNOSIS — N390 Urinary tract infection, site not specified: Secondary | ICD-10-CM

## 2016-12-20 LAB — URINALYSIS, COMPLETE
Bilirubin, UA: POSITIVE — AB
Glucose, UA: NEGATIVE
Ketones, UA: NEGATIVE
Nitrite, UA: NEGATIVE
PH UA: 5.5 (ref 5.0–7.5)
Specific Gravity, UA: 1.015 (ref 1.005–1.030)
Urobilinogen, Ur: 0.2 mg/dL (ref 0.2–1.0)

## 2016-12-20 LAB — MICROSCOPIC EXAMINATION: EPITHELIAL CELLS (NON RENAL): NONE SEEN /HPF (ref 0–10)

## 2016-12-20 NOTE — Telephone Encounter (Signed)
Patient is coming In today to have his urine checked. He is complaining of cloudy urine, and a foul odor. He will see Vikki Ports first. Just wanted you to be aware in case they send it out for culture and you get a report.  Thanks,  Sharyn Lull

## 2016-12-20 NOTE — Progress Notes (Signed)
Pt presents today with c/o urinary frequency, blood in urine, lower abd pain, lower back pain, and night sweats. Pt stated that starting yesterday he has been able to see yeast blooms in his urine. Pt has been on abx and also has have urological surgery in the last 30 days. A clean catch was obtained for u/a and cx.  Blood pressure (!) 162/100, pulse 73, height 5\' 7"  (1.702 m), weight 194 lb 9.6 oz (88.3 kg).

## 2016-12-21 ENCOUNTER — Telehealth: Payer: Self-pay

## 2016-12-21 DIAGNOSIS — N390 Urinary tract infection, site not specified: Secondary | ICD-10-CM

## 2016-12-21 MED ORDER — AMPICILLIN 500 MG PO CAPS: 500.0000 mg | ORAL_CAPSULE | Freq: Four times a day (QID) | ORAL | 0 refills | Status: DC

## 2016-12-21 NOTE — Telephone Encounter (Signed)
Spoke with pt in reference to needing to start abx. Made aware abx were sent to pharmacy. Pt voiced understanding. Pt requested another script for catheters. Orders were sent to [place].

## 2016-12-21 NOTE — Telephone Encounter (Signed)
-----   Message from Hollice Espy, MD sent at 12/20/2016  7:52 PM EST ----- Please go ahead and treat with ampicillin 500 mg qid x 7 days.    Hollice Espy, MD

## 2016-12-22 ENCOUNTER — Emergency Department: Payer: Medicare Other

## 2016-12-22 ENCOUNTER — Encounter: Payer: Self-pay | Admitting: *Deleted

## 2016-12-22 ENCOUNTER — Encounter: Payer: Self-pay | Admitting: Urology

## 2016-12-22 ENCOUNTER — Other Ambulatory Visit: Payer: Self-pay

## 2016-12-22 ENCOUNTER — Emergency Department
Admission: EM | Admit: 2016-12-22 | Discharge: 2016-12-22 | Disposition: A | Discharge status: Home / Self Care | Payer: Medicare Other | Attending: Emergency Medicine | Admitting: Emergency Medicine

## 2016-12-22 DIAGNOSIS — N453 Epididymo-orchitis: Secondary | ICD-10-CM | POA: Diagnosis not present

## 2016-12-22 DIAGNOSIS — Z79899 Other long term (current) drug therapy: Secondary | ICD-10-CM | POA: Insufficient documentation

## 2016-12-22 DIAGNOSIS — I10 Essential (primary) hypertension: Secondary | ICD-10-CM | POA: Insufficient documentation

## 2016-12-22 DIAGNOSIS — Z7982 Long term (current) use of aspirin: Secondary | ICD-10-CM | POA: Insufficient documentation

## 2016-12-22 DIAGNOSIS — N50812 Left testicular pain: Secondary | ICD-10-CM | POA: Diagnosis present

## 2016-12-22 DIAGNOSIS — Z87891 Personal history of nicotine dependence: Secondary | ICD-10-CM | POA: Diagnosis not present

## 2016-12-22 DIAGNOSIS — N5089 Other specified disorders of the male genital organs: Secondary | ICD-10-CM

## 2016-12-22 LAB — CBC
HEMATOCRIT: 43.9 % (ref 40.0–52.0)
Hemoglobin: 15.1 g/dL (ref 13.0–18.0)
MCH: 31.9 pg (ref 26.0–34.0)
MCHC: 34.4 g/dL (ref 32.0–36.0)
MCV: 92.8 fL (ref 80.0–100.0)
Platelets: 193 10*3/uL (ref 150–440)
RBC: 4.72 MIL/uL (ref 4.40–5.90)
RDW: 13.2 % (ref 11.5–14.5)
WBC: 14.4 10*3/uL — ABNORMAL HIGH (ref 3.8–10.6)

## 2016-12-22 LAB — URINALYSIS, COMPLETE (UACMP) WITH MICROSCOPIC
Bacteria, UA: NONE SEEN
Bilirubin Urine: NEGATIVE
GLUCOSE, UA: NEGATIVE mg/dL
HGB URINE DIPSTICK: NEGATIVE
KETONES UR: NEGATIVE mg/dL
NITRITE: NEGATIVE
PROTEIN: NEGATIVE mg/dL
Specific Gravity, Urine: 1.011 (ref 1.005–1.030)
Squamous Epithelial / LPF: NONE SEEN
pH: 6 (ref 5.0–8.0)

## 2016-12-22 LAB — BASIC METABOLIC PANEL
Anion gap: 7 (ref 5–15)
BUN: 17 mg/dL (ref 6–20)
CALCIUM: 9.3 mg/dL (ref 8.9–10.3)
CO2: 28 mmol/L (ref 22–32)
Chloride: 103 mmol/L (ref 101–111)
Creatinine, Ser: 0.85 mg/dL (ref 0.61–1.24)
GFR calc Af Amer: >60 mL/min (ref >60)
Glucose, Bld: 116 mg/dL — ABNORMAL HIGH (ref 65–99)
POTASSIUM: 3.6 mmol/L (ref 3.5–5.1)
Sodium: 138 mmol/L (ref 135–145)

## 2016-12-22 NOTE — ED Notes (Signed)
Pt discharged to home.  Family member driving.  Discharge instructions reviewed.  Verbalized understanding.  No questions or concerns at this time.  Teach back verified.  Pt in NAD.  No items left in ED.   

## 2016-12-22 NOTE — ED Triage Notes (Signed)
States he is currently on abx for a UTI, states he has had 2 days of left testicle swelling, states hx of two prostrate surgery, awake and alert in no acute distress

## 2016-12-22 NOTE — ED Provider Notes (Signed)
[facility] Emergency Department Provider Note  ____________________________________________   First MD Initiated Contact with Patient 12/22/16 1914     (approximate)  I have reviewed the triage vital signs and the nursing notes.   HISTORY  Chief Complaint Testicle Pain    HPI Marvin Jefferson is a 76 y.o. male with a count.  History of urological issues.  He is followed by Dr. Erlene Quan and has recently had some procedures.  He saw her yesterday and was started on ampicillin for a UTI/epididymitis.  He presents today to the emergency department with concerns regarding swelling in his left testis.  He reports that it has started over about 48 hours and is gradually getting worse.  There is some associated pain that he describes as moderate.  It is worse with ambulation and palpation.  He denies fever/chills, chest pain, shortness of breath, nausea, vomiting, abdominal pain, and right testicular pain.  He self catheters so thus does not have any dysuria.  He has had good urine output.  Nothing particular makes his symptoms better.  He started on ampicillin just yesterday.   Past Medical History:  Diagnosis Date  . Anxiety   . Chronic back pain   . DDD (degenerative disc disease), cervical   . DDD (degenerative disc disease), lumbar   . Dementia   . Depression   . Hypertension   . Meniere's disease   . Pneumonia 2015  . Umbilical hernia   . UTI (urinary tract infection)     Patient Active Problem List   Diagnosis Date Noted  . Benign essential hypertension 09/16/2016  . BPH with obstruction/lower urinary tract symptoms 09/16/2016  . Medicare annual wellness visit, initial 09/16/2016  . Lumbar disc disease 02/28/2014  . Meniere's disease 02/28/2014  . AR (allergic rhinitis) 02/27/2014  . Cervical disc disease 02/27/2014  . HTN (hypertension) 02/27/2014  . Late onset Alzheimer's disease without behavioral disturbance 02/27/2014  . OA  (osteoarthritis) 02/27/2014    Past Surgical History:  Procedure Laterality Date  . APPENDECTOMY    . GANGLION CYST EXCISION Right    wrist  . HERNIA REPAIR     umbilical hernia  . 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 N/A 08/23/2016   Procedure: 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;  Surgeon: Hollice Espy, MD;  Location: [facility] ORS;  Service: Urology;  Laterality: N/A;  . 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 N/A 12/06/2016   Procedure: 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;  Surgeon: Hollice Espy, MD;  Location: [facility] ORS;  Service: Urology;  Laterality: N/A;    Prior to Admission medications   Medication Sig Start Date End Date Taking? Authorizing Provider  acetaminophen (TYLENOL) 500 MG tablet Take 1,000 mg by mouth every 6 (six) hours as needed for mild pain.     Historical Provider, MD  ALPRAZolam Duanne Moron) 0.25 MG tablet Take 0.25 mg by mouth 2 (two) times daily as needed (dizziness).  04/07/16   Historical Provider, MD  amLODipine (NORVASC) 5 MG tablet Take 5 mg by mouth 2 (two) times daily.  06/23/16   Historical Provider, MD  ampicillin (PRINCIPEN) 500 MG capsule Take 1 capsule (500 mg total) by mouth 4 (four) times daily. 12/21/16   Hollice Espy, MD  aspirin EC 81 MG tablet Take 81 mg by mouth daily.     Historical Provider, MD  Cholecalciferol (VITAMIN D3) 2000 units capsule Take 2,000 Units by mouth daily.     Historical Provider, MD  cyanocobalamin (V-R VITAMIN B-12) 500 MCG tablet Take 500 mcg by mouth 2 (two) times daily.     Historical Provider, MD  docusate sodium (COLACE) 100 MG capsule Take 100 mg by mouth daily.     Historical Provider, MD  etodolac (LODINE) 400 MG tablet Take 400 mg by mouth daily.     Historical Provider, MD  FLUoxetine (PROZAC) 20 MG tablet Take 20 mg by mouth daily.    Historical Provider, MD  fluticasone (FLONASE) 50 MCG/ACT nasal spray USE 2 SPRAYS IN EACH NOSTRIL EVERY  DAY AS NEEDED FOR ALLERGIES 05/04/16   Historical Provider, MD  galantamine (RAZADYNE) 12 MG tablet Take 12 mg by mouth 2 (two) times daily.  06/11/16 11/26/16  Historical Provider, MD  Glucosamine HCl 1500 MG TABS Take 1 tablet by mouth 2 (two) times daily.    Historical Provider, MD  HYDROcodone-acetaminophen (NORCO/VICODIN) 5-325 MG tablet Take 1-2 tablets by mouth every 6 (six) hours as needed for moderate pain. 12/06/16   Hollice Espy, MD  hydrocortisone cream 1 % Apply 1 application topically daily as needed for itching.    Historical Provider, MD  meclizine (ANTIVERT) 25 MG tablet Take 25 mg by mouth 3 (three) times daily as needed for dizziness.    Historical Provider, MD  Melatonin 10 MG TABS Take 10 m by mouth at bedtime as needed (sleep).    Historical Provider, MD  Melatonin 5 MG CAPS Take 1 capsule by mouth at bedtime.    Historical Provider, MD  methocarbamol (ROBAXIN) 500 MG tablet Take 500 mg by mouth 3 (three) times daily as needed for muscle spasms.    Historical Provider, MD  Multiple Vitamins-Minerals (MULTIVITAMIN WITH MINERALS) tablet Take 1 tablet by mouth daily.    Historical Provider, MD  neomycin-bacitracin-polymyxin (NEOSPORIN) ointment Apply 1 application topically daily as needed for wound care. apply to eye    Historical Provider, MD  Omega-3 Fatty Acids (FISH OIL) 1000 MG CAPS Take 1,000 mg by mouth daily.     Historical Provider, MD  Probiotic Product (PROBIOTIC ADVANCED PO) Take 1 capsule by mouth daily.    Historical Provider, MD  sodium chloride (OCEAN) 0.65 % SOLN nasal spray Place 2 sprays into both nostrils 2 (two) times daily as needed for congestion.    Historical Provider, MD  terazosin (HYTRIN) 5 MG capsule Take 5 mg by mouth 2 (two) times daily.  03/08/16   Historical Provider, MD  traMADol (ULTRAM) 50 MG tablet Take 50 mg by mouth every 6 (six) hours as needed for moderate pain.  03/08/16   Historical Provider, MD  vitamin E 1000 UNIT capsule Take 1,000 Units by  mouth daily.     Historical Provider, MD    Allergies Clarithromycin  Family History  Problem Relation Age of Onset  . Prostate cancer Father   . Hypertension Father   . Stroke Father   . Hypertension Mother   . Dementia Mother   . Bladder Cancer Neg Hx   . Kidney cancer Neg Hx     Social History Social History  Substance Use Topics  . Smoking status: Former Smoker    Quit date: 08/02/1962  . Smokeless tobacco: Never Used  . Alcohol use No    Review of Systems Constitutional: No fever/chills Eyes: No visual changes. ENT: No sore throat. Cardiovascular: Denies chest pain. Respiratory: Denies shortness of breath. Gastrointestinal: No abdominal pain.  No nausea, no vomiting.  No diarrhea.  No constipation. Genitourinary: Swelling and tenderness in the  left testicle.  Negative for dysuria. Musculoskeletal: Negative for back pain. Skin: Negative for rash. Neurological: Negative for headaches, focal weakness or numbness.  10-point ROS otherwise negative.  ____________________________________________   PHYSICAL EXAM:  VITAL SIGNS: ED Triage Vitals  Enc Vitals Group     BP 12/22/16 1832 (!) 144/74     Pulse Rate 12/22/16 1832 91     Resp 12/22/16 1832 18     Temp 12/22/16 1832 98.6 F (37 C)     Temp Source 12/22/16 1832 Oral     SpO2 12/22/16 1832 95 %     Weight 12/22/16 1832 194 lb (88 kg)     Height 12/22/16 1832 5\' 7"  (1.702 m)     Head Circumference --      Peak Flow --      Pain Score 12/22/16 1833 10     Pain Loc --      Pain Edu? --      Excl. in [place]? --     Constitutional: Alert and oriented. Well appearing and in no acute distress. Eyes: Conjunctivae are normal. PERRL. EOMI. Head: Atraumatic. Nose: No congestion/rhinnorhea. Mouth/Throat: Mucous membranes are moist. Neck: No stridor.  No meningeal signs.   Cardiovascular: Normal rate, regular rhythm. Good peripheral circulation. Grossly normal heart sounds. Respiratory: Normal respiratory  effort.  No retractions. Lungs CTAB. Gastrointestinal: Soft and nontender. No distention.  Genitourinary: Swollen and firm left testicle compared to the right.  It is mildly tender to palpation throughout and including the epididymis. Musculoskeletal: No lower extremity tenderness nor edema. No gross deformities of extremities. Neurologic:  Normal speech and language. No gross focal neurologic deficits are appreciated.  Skin:  Skin is warm, dry and intact. No rash noted.   ____________________________________________   LABS (all labs ordered are listed, but only abnormal results are displayed)  Labs Reviewed  URINALYSIS, COMPLETE (UACMP) WITH MICROSCOPIC - Abnormal; Notable for the following:       Result Value   Color, Urine YELLOW (*)    APPearance HAZY (*)    Leukocytes, UA LARGE (*)    All other components within normal limits  BASIC METABOLIC PANEL - Abnormal; Notable for the following:    Glucose, Bld 116 (*)    All other components within normal limits  CBC - Abnormal; Notable for the following:    WBC 14.4 (*)    All other components within normal limits  URINE CULTURE   ____________________________________________  EKG  None - EKG not ordered by ED physician ____________________________________________  RADIOLOGY   US Scrotum  Result Date: 12/22/2016 CLINICAL DATA:  Left testicular swelling x2 days, recurrent urinary tract infection EXAM: ULTRASOUND OF SCROTUM TECHNIQUE: Complete ultrasound examination of the testicles, epididymis, and other scrotal structures was performed. COMPARISON:  None. FINDINGS: Right testicle Measurements: 4.3 x 2.4 x 2.9 cm. No mass or microlithiasis visualized. Left testicle Measurements: 4.4 x 2.8 x 3.1 cm. There is mild nonspecific inhomogeneity of the testicular parenchyma without focal mass. Given slight increase in vascularity of the left testicle relative to the right, findings may be secondary to an orchitis. Right epididymis:  1.3 x  0.3 x 0.4 cm cyst of the right epididymis. Left epididymis: Hyperemic and mildly enlarged and heterogeneous relative to the right. Hydrocele:  Small bilateral hydroceles. Varicocele:  None visualized. IMPRESSION: 1. Hyperemic and mildly enlarged and heterogeneous appearance of the left epididymis and testicle consistent with epididymo-orchitis. 2. Small right epididymal cyst measuring 1.3 x 0 3 x 0.4 cm. 3.  Small bilateral hydroceles. 4. No testicular torsion. Electronically Signed   By: Ashley Royalty M.D.   On: 12/22/2016 19:20   [place] Art/ven Flow Abd Pelv Doppler  Result Date: 12/22/2016 CLINICAL DATA:  Left testicular swelling x2 days, recurrent urinary tract infection EXAM: ULTRASOUND OF SCROTUM TECHNIQUE: Complete ultrasound examination of the testicles, epididymis, and other scrotal structures was performed. COMPARISON:  None. FINDINGS: Right testicle Measurements: 4.3 x 2.4 x 2.9 cm. No mass or microlithiasis visualized. Left testicle Measurements: 4.4 x 2.8 x 3.1 cm. There is mild nonspecific inhomogeneity of the testicular parenchyma without focal mass. Given slight increase in vascularity of the left testicle relative to the right, findings may be secondary to an orchitis. Right epididymis:  1.3 x 0.3 x 0.4 cm cyst of the right epididymis. Left epididymis: Hyperemic and mildly enlarged and heterogeneous relative to the right. Hydrocele:  Small bilateral hydroceles. Varicocele:  None visualized. IMPRESSION: 1. Hyperemic and mildly enlarged and heterogeneous appearance of the left epididymis and testicle consistent with epididymo-orchitis. 2. Small right epididymal cyst measuring 1.3 x 0 3 x 0.4 cm. 3. Small bilateral hydroceles. 4. No testicular torsion. Electronically Signed   By: Ashley Royalty M.D.   On: 12/22/2016 19:20    ____________________________________________   PROCEDURES  Procedure(s) performed:   Procedures   Critical Care performed:  No ____________________________________________   INITIAL IMPRESSION / ASSESSMENT AND PLAN / ED COURSE  Pertinent labs & imaging results that were available during my care of the patient were reviewed by me and considered in my medical decision making (see chart for details).     Clinical Course as of Dec 22 1948  Wed Dec 22, 2016  1936 U/S consistent with orchitis.  Paging Dr. Erlene Quan to discuss.  [CF]  1945 I spoke by phone with Dr. Erlene Quan.  We reviewed the ultrasound results and she was comfortable with the plan to continue his antibiotics that were just prescribed by her yesterday.  She prescribed ampicillin based on prior culture results and while the current culture is still pending she feels that this is likely appropriate and provide adequate coverage.  She did not recommend any other changes at this time and told me to provide reassurance, encourage scrotal support, and follow-up in the clinic.  I reiterated to him what she related that it can often take 72 hours for symptoms to begin improving.I gave my usual and customary return precautions. He and his wife are comfortable with the plan.  [CF]    Clinical Course User Index [CF] Hinda Kehr, MD    ____________________________________________  FINAL CLINICAL IMPRESSION(S) / ED DIAGNOSES  Final diagnoses:  Swollen testicle  Epididymoorchitis     MEDICATIONS GIVEN DURING THIS VISIT:  Medications - No data to display   NEW OUTPATIENT MEDICATIONS STARTED DURING THIS VISIT:  New Prescriptions   No medications on file    Modified Medications   No medications on file    Discontinued Medications   No medications on file     Note:  This document was prepared using Dragon voice recognition software and may include unintentional dictation errors.    Hinda Kehr, MD 12/22/16 1950

## 2016-12-22 NOTE — Discharge Instructions (Signed)
Dr. Erlene Quan was comfortable that you are on the appropriate antibiotic at this time.  Use the pain medication that you have at home.  The aware that it may take up to 72 hours for the antibiotic to improve your symptoms.  You can call her office and schedule an appropriate follow-up appointment as per their recommendations.  Return to the emergency department if you develop new or worsening symptoms that concern you.

## 2016-12-23 ENCOUNTER — Telehealth: Payer: Self-pay

## 2016-12-23 DIAGNOSIS — N390 Urinary tract infection, site not specified: Secondary | ICD-10-CM

## 2016-12-23 LAB — CULTURE, URINE COMPREHENSIVE

## 2016-12-23 MED ORDER — SULFAMETHOXAZOLE-TRIMETHOPRIM 800-160 MG PO TABS: 1.0000 | ORAL_TABLET | Freq: Two times a day (BID) | ORAL | 0 refills | Status: AC

## 2016-12-23 NOTE — Telephone Encounter (Signed)
-----   Message from Hollice Espy, MD sent at 12/23/2016  9:56 AM EST ----- Mr. Marvin Jefferson went to the emergency room last night for now epididymal orchitis. I just got back his urine culture results which did grow Enterobacter but is resistant to amoxicillin. Please change his antibiotics to Bactrim DS twice a day 10 days and have him get in at least 2 doses today, one this morning and 1 this evening.  Continue to encourage scrotal support. It can take quite some time for his epididymoorchitis to resolve, at least 3 days to start seeing improvement. If things worsen, call us and let us know.  Hollice Espy, MD

## 2016-12-23 NOTE — Telephone Encounter (Signed)
Spoke with pt wife in reference to stopping current abx and starting new abx. Reinforced with wife pt needs to have at least 2 doses today. Encouraged scrotal support. Abx sent to pharmacy. Wife voiced understanding of whole conversation.

## 2016-12-23 NOTE — Telephone Encounter (Signed)
Called to follow up with pt about testicle pain. Pt stated that he went to the ER and they consulted with Dr. Erlene Quan. Reinforced with pt if his s/s dont improve with taking medication to call [facility] back. Pt voiced understanding.

## 2016-12-24 LAB — URINE CULTURE
Culture: NO GROWTH
Special Requests: NORMAL

## 2017-01-07 ENCOUNTER — Encounter: Payer: Self-pay | Admitting: Urology

## 2017-01-07 ENCOUNTER — Ambulatory Visit (INDEPENDENT_AMBULATORY_CARE_PROVIDER_SITE_OTHER): Payer: Medicare Other | Admitting: Urology

## 2017-01-07 VITALS — BP 124/83 | HR 71 | Ht 67.0 in | Wt 197.4 lb

## 2017-01-07 DIAGNOSIS — R339 Retention of urine, unspecified: Secondary | ICD-10-CM

## 2017-01-07 DIAGNOSIS — N451 Epididymitis: Secondary | ICD-10-CM

## 2017-01-07 DIAGNOSIS — N390 Urinary tract infection, site not specified: Secondary | ICD-10-CM

## 2017-01-07 DIAGNOSIS — N138 Other obstructive and reflux uropathy: Secondary | ICD-10-CM | POA: Diagnosis not present

## 2017-01-07 DIAGNOSIS — N401 Enlarged prostate with lower urinary tract symptoms: Secondary | ICD-10-CM | POA: Diagnosis not present

## 2017-01-07 LAB — URINALYSIS, COMPLETE
BILIRUBIN UA: POSITIVE — AB
Glucose, UA: NEGATIVE
KETONES UA: NEGATIVE
NITRITE UA: POSITIVE — AB
Protein, UA: NEGATIVE
Specific Gravity, UA: <1.005 — ABNORMAL LOW (ref 1.005–1.030)
UUROB: 0.2 mg/dL (ref 0.2–1.0)
pH, UA: 5.5 (ref 5.0–7.5)

## 2017-01-07 LAB — MICROSCOPIC EXAMINATION
Epithelial Cells (non renal): NEGATIVE /hpf (ref 0–10)
RBC MICROSCOPIC, UA: NEGATIVE /HPF (ref 0–?)

## 2017-01-07 LAB — BLADDER SCAN AMB NON-IMAGING: Scan Result: 317

## 2017-01-07 NOTE — Progress Notes (Signed)
01/07/2017 4:17 PM   Marvin Jefferson 04/18/1941 [id number]  Referring provider: Rusty Aus, MD [facility] [place], [place] [address]  Chief Complaint  Patient presents with  . 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  Following surgery, he's had recurrent urinary tract infections and continue to need to clean intermittent catheter. When catheterizing, he noted resistance at the bladder neck. 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  His postoperative course was complicated by urinary tract infection which evolved into an epididymoorchitis.  He had significant swelling of his left testicle which is now completely resolved. He has no scrotal pain today.  He voids spontaneously and self cath daily. He brings immaculate data with him today outlining his urinary habits for the month of March. Each day after voiding anywhere from 100-300 mL's, he catheterizes for volumes ranging anywhere from 150-225 cc. In general, he catheterizes for slightly lower volumes that he voids. The rest of the day, he is voiding spontaneously.  Urine today Continues to be suspicious for ongoing infection/colonization. He is otherwise asymptomatic with no dysuria, urinary frequency, or urgency. He does feel he is emptying his bladder fairly well.   He is currently on Flomax as well as terazosin which he takes for his blood pressure.  PMH: Past Medical History:  Diagnosis Date  . Anxiety   . Chronic back pain   . DDD (degenerative disc disease), cervical   . DDD (degenerative disc disease), lumbar   . Dementia   . Depression   . Hypertension   . Meniere's disease   . Pneumonia 2015  . Umbilical hernia   . UTI (urinary tract infection)     Surgical History: Past Surgical History:    Procedure Laterality Date  . APPENDECTOMY    . GANGLION CYST EXCISION Right    wrist  . HERNIA REPAIR     umbilical hernia  . 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 N/A 08/23/2016   Procedure: 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;  Surgeon: Hollice Espy, MD;  Location: [facility] ORS;  Service: Urology;  Laterality: N/A;  . 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 N/A 12/06/2016   Procedure: 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;  Surgeon: Hollice Espy, MD;  Location: [facility] ORS;  Service: Urology;  Laterality: N/A;    Home Medications:  Allergies as of 01/07/2017      Reactions   Clarithromycin Other (See Comments)   Terrible taste and smell      Medication List       Accurate as of 01/07/17  4:17 PM. Always use your most recent med list.          acetaminophen 500 MG tablet Commonly known as:  TYLENOL Take 1,000 mg by mouth every 6 (six) hours as needed for mild pain.   ALPRAZolam 0.25 MG tablet Commonly known as:  XANAX Take 0.25 mg by mouth 2 (two) times daily as needed (dizziness).   amLODipine 5 MG tablet Commonly known as:  NORVASC Take 5 mg by mouth 2 (two) times daily.   ampicillin 500 MG capsule Commonly known as:  PRINCIPEN Take 1 capsule (500 mg total) by mouth 4 (four) times daily.   aspirin EC 81 MG tablet Take 81 mg by mouth daily.  docusate sodium 100 MG capsule Commonly known as:  COLACE Take 100 mg by mouth daily.   etodolac 400 MG tablet Commonly known as:  LODINE Take 400 mg by mouth daily.   Fish Oil 1000 MG Caps Take 1,000 mg by mouth daily.   FLUoxetine 20 MG tablet Commonly known as:  PROZAC Take 20 mg by mouth daily.   fluticasone 50 MCG/ACT nasal spray Commonly known as:  FLONASE USE 2 SPRAYS IN EACH NOSTRIL EVERY DAY AS NEEDED FOR ALLERGIES   galantamine 12 MG tablet Commonly known as:  RAZADYNE Take 12 mg by mouth 2 (two) times daily.   Glucosamine  HCl 1500 MG Tabs Take 1 tablet by mouth 2 (two) times daily.   HYDROcodone-acetaminophen 5-325 MG tablet Commonly known as:  NORCO/VICODIN Take 1-2 tablets by mouth every 6 (six) hours as needed for moderate pain.   hydrocortisone cream 1 % Apply 1 application topically daily as needed for itching.   meclizine 25 MG tablet Commonly known as:  ANTIVERT Take 25 mg by mouth 3 (three) times daily as needed for dizziness.   Melatonin 5 MG Caps Take 1 capsule by mouth at bedtime.   methocarbamol 500 MG tablet Commonly known as:  ROBAXIN Take 500 mg by mouth 3 (three) times daily as needed for muscle spasms.   multivitamin with minerals tablet Take 1 tablet by mouth daily.   neomycin-bacitracin-polymyxin ointment Commonly known as:  NEOSPORIN Apply 1 application topically daily as needed for wound care. apply to eye   PROBIOTIC ADVANCED PO Take 1 capsule by mouth daily.   sodium chloride 0.65 % Soln nasal spray Commonly known as:  OCEAN Place 2 sprays into both nostrils 2 (two) times daily as needed for congestion.   terazosin 5 MG capsule Commonly known as:  HYTRIN Take 5 mg by mouth 2 (two) times daily.   traMADol 50 MG tablet Commonly known as:  ULTRAM Take 50 mg by mouth every 6 (six) hours as needed for moderate pain.   V-R VITAMIN B-12 500 MCG tablet Generic drug:  cyanocobalamin Take 500 mcg by mouth 2 (two) times daily.   Vitamin D3 2000 units capsule Take 2,000 Units by mouth daily.   vitamin E 1000 UNIT capsule Take 1,000 Units by mouth daily.       Allergies:  Allergies  Allergen Reactions  . Clarithromycin Other (See Comments)    Terrible taste and smell    Family History: Family History  Problem Relation Age of Onset  . Prostate cancer Father   . Hypertension Father   . Stroke Father   . Hypertension Mother   . Dementia Mother   . Bladder Cancer Neg Hx   . Kidney cancer Neg Hx     Social History:  reports that he quit smoking about 54  years ago. He has never used smokeless tobacco. He reports that he does not drink alcohol or use drugs.  ROS: UROLOGY Frequent Urination?: No Hard to postpone urination?: No Burning/pain with urination?: No Get up at night to urinate?: No Leakage of urine?: No Urine stream starts and stops?: No Trouble starting stream?: No Do you have to strain to urinate?: No Blood in urine?: No Urinary tract infection?: No Sexually transmitted disease?: No Injury to kidneys or bladder?: No Painful intercourse?: No Weak stream?: No Erection problems?: No Penile pain?: No  Gastrointestinal Nausea?: No Vomiting?: No Indigestion/heartburn?: No Diarrhea?: No Constipation?: No  Constitutional Fever: No Night sweats?: No Weight loss?: No Fatigue?: No  Skin Skin rash/lesions?: No Itching?: No  Eyes Blurred vision?: No Double vision?: No  Ears/Nose/Throat Sore throat?: No Sinus problems?: No  Hematologic/Lymphatic Swollen glands?: No Easy bruising?: No  Cardiovascular Leg swelling?: No Chest pain?: No  Respiratory Cough?: No Shortness of breath?: No  Endocrine Excessive thirst?: No  Musculoskeletal Back pain?: Yes Joint pain?: No  Neurological Headaches?: No Dizziness?: Yes  Psychologic Depression?: Yes Anxiety?: No  Physical Exam: BP 124/83 (BP Location: Right Arm, Patient Position: Sitting, Cuff Size: Normal)   Pulse 71   Ht 5\' 7"  (1.702 m)   Wt 197 lb 6.4 oz (89.5 kg)   BMI 30.92 kg/m   Constitutional:  Alert and oriented, No acute distress.  Accompanied by wife today. HEENT: [place] AT, moist mucus membranes.  Trachea midline, no masses. Cardiovascular: No clubbing, cyanosis, or edema. Respiratory: Normal respiratory effort, no increased work of breathing. GI: Abdomen is soft, nontender, nondistended, no abdominal masses GU: Normal uncircumcised phallus. Bilateral testicles descended, no testicular enlargement, epididymal tenderness bilaterally. No scrotal  skin changes.  Skin: No rashes, bruises or suspicious lesions. Neurologic: Grossly intact, no focal deficits, moving all 4 extremities. Psychiatric: Normal mood and affect.  Laboratory Data: Lab Results  Component Value Date   WBC 14.4 (H) 12/22/2016   HGB 15.1 12/22/2016   HCT 43.9 12/22/2016   MCV 92.8 12/22/2016   PLT 193 12/22/2016    Lab Results  Component Value Date   CREATININE 0.85 12/22/2016    Urinalysis Results for orders placed or performed in visit on 01/07/17  Microscopic Examination  Result Value Ref Range   WBC, UA >30W 0 - 5 /hpf   RBC, UA Negative 0 - 2 /hpf   Epithelial Cells (non renal) Negative 0 - 10 /hpf   Bacteria, UA Many (A) None seen/Few  Urinalysis, Complete  Result Value Ref Range   Specific Gravity, UA <1.005 (L) 1.005 - 1.030   pH, UA 5.5 5.0 - 7.5   Color, UA Yellow Yellow   Appearance Ur Cloudy (A) Clear   Leukocytes, UA 3+ (A) Negative   Protein, UA Negative Negative/Trace   Glucose, UA Negative Negative   Ketones, UA Negative Negative   RBC, UA Trace (A) Negative   Bilirubin, UA Positive (A) Negative   Urobilinogen, Ur 0.2 0.2 - 1.0 mg/dL   Nitrite, UA Positive (A) Negative   Microscopic Examination See below:   BLADDER SCAN AMB NON-IMAGING  Result Value Ref Range   Scan Result 317     Pertinent Imaging: PVR 317 - needs to void again now   Assessment & Plan:    1. BPH with obstruction/lower urinary tract symptoms s/p HoLEP with return for excision of retained lobe recently Previously in florid retention now voiding with slightly elevated PVRs OK to d/c Flomax, stay on terazosin (BP med) as two alpha blockers not indicated - BLADDER SCAN AMB NON-IMAGING - Urinalysis, Complete  2. Incomplete bladder emptying PVRs mildly elevated to 100-200 range without stones, Cr stable Given that he is voiding spontaneously otherwise and has no complaints today, ok to DC self catheter in the evenings based on voiding diary reviewed  today Will continue to follow - self cath as needed  3. 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 Left epididymitis Resolved   Return in about 6 months (around 07/10/2017) for PVR, IPSS.  Hollice Espy, MD  [facility]  [facility] [address] [place], [place] [address] [phone]

## 2017-01-11 ENCOUNTER — Encounter: Payer: Self-pay | Admitting: Urology

## 2017-01-11 LAB — CULTURE, URINE COMPREHENSIVE

## 2017-01-12 ENCOUNTER — Encounter: Payer: Self-pay | Admitting: Urology

## 2017-01-12 ENCOUNTER — Telehealth: Payer: Self-pay

## 2017-01-12 NOTE — Telephone Encounter (Signed)
Pt sent a mychart message stating he saw his ucx results and wanted abx asap. Per Dr. Erlene Quan no abx will be given as organism is resistant to all oral abx. Reinforced with pt if he is having pelvic pain he needs to make sure hes not back in retention. Pt voiced understanding of whole conversation.

## 2017-01-18 ENCOUNTER — Other Ambulatory Visit: Payer: Self-pay | Admitting: Gastroenterology

## 2017-01-18 DIAGNOSIS — R1310 Dysphagia, unspecified: Secondary | ICD-10-CM

## 2017-01-20 ENCOUNTER — Ambulatory Visit
Admission: RE | Admit: 2017-01-20 | Discharge: 2017-01-20 | Disposition: A | Discharge status: Home / Self Care | Payer: Medicare Other | Source: Ambulatory Visit | Attending: Gastroenterology | Admitting: Gastroenterology

## 2017-01-20 DIAGNOSIS — R1310 Dysphagia, unspecified: Secondary | ICD-10-CM | POA: Diagnosis present

## 2017-01-20 DIAGNOSIS — K224 Dyskinesia of esophagus: Secondary | ICD-10-CM | POA: Insufficient documentation

## 2017-03-24 ENCOUNTER — Ambulatory Visit: Payer: Medicare Other

## 2017-03-24 ENCOUNTER — Telehealth: Payer: Self-pay

## 2017-03-24 VITALS — BP 144/55 | HR 77 | Ht 67.0 in | Wt 192.0 lb

## 2017-03-24 DIAGNOSIS — R300 Dysuria: Secondary | ICD-10-CM

## 2017-03-24 LAB — URINALYSIS, COMPLETE
BILIRUBIN UA: NEGATIVE
Glucose, UA: NEGATIVE
Ketones, UA: NEGATIVE
NITRITE UA: POSITIVE — AB
PH UA: 6 (ref 5.0–7.5)
Specific Gravity, UA: 1.02 (ref 1.005–1.030)
UUROB: 0.2 mg/dL (ref 0.2–1.0)

## 2017-03-24 LAB — MICROSCOPIC EXAMINATION: RBC MICROSCOPIC, UA: NONE SEEN /HPF (ref 0–?)

## 2017-03-24 MED ORDER — AMOXICILLIN-POT CLAVULANATE 875-125 MG PO TABS: 1.0000 | ORAL_TABLET | Freq: Two times a day (BID) | ORAL | 0 refills | Status: DC

## 2017-03-24 NOTE — Progress Notes (Signed)
Patient c/o foul urine odor,  back/flank pain, lower abdominal pain, fever, and night sweats.   Pt provided urine sample. Ordered urinalysis. Advised would contact after urinalysis results review.

## 2017-03-24 NOTE — Telephone Encounter (Signed)
Called patient. Gave lab results and med instructions. Patient verbalized understanding.

## 2017-03-24 NOTE — Telephone Encounter (Signed)
-----   Message from Nori Riis, PA-C sent at 03/24/2017 11:14 AM EDT ----- Please send Augmentin 875/125, one tablet twice daily, # 14 to [facility].  I will not be in the office next week, so make sure another provider sees his culture results.  ----- Message ----- From: Leia Alf, CMA Sent: 03/24/2017  11:06 AM To: Nori Riis, PA-C

## 2017-03-25 NOTE — Progress Notes (Signed)
Discussed patient's UA with him. It does appear that he is chronically colonized but now is symptomatic. Based on his previous urine cultures, he has has pan resistant bacteria and will likely need a PICC line with antibiotics.  If he becomes sick over the weekend with fevers, chills, or any other warning symptoms, he should go to the emergency room for IV antibiotics.  He expresses understanding.  Hollice Espy, MD

## 2017-03-30 ENCOUNTER — Telehealth: Payer: Self-pay

## 2017-03-30 LAB — URINE CULTURE

## 2017-03-30 MED ORDER — TRIMETHOPRIM 100 MG PO TABS: 100.0000 mg | ORAL_TABLET | Freq: Every day | ORAL | 1 refills | Status: DC

## 2017-03-30 MED ORDER — SULFAMETHOXAZOLE-TRIMETHOPRIM 800-160 MG PO TABS: 1.0000 | ORAL_TABLET | Freq: Two times a day (BID) | ORAL | 0 refills | Status: DC

## 2017-03-30 NOTE — Telephone Encounter (Signed)
-----   Message from Hollice Espy, MD sent at 03/30/2017  8:11 AM EDT ----- Patient's urine culture was resistant to Augmentin. It looks like it is sensitive to Bactrim this time, we'll prescribe Bactrim DS twice a day 7 days and then it like him to start on trimethoprim 100 mg dispensed #90 to be taken daily for suppression.  Hollice Espy, MD

## 2017-03-30 NOTE — Telephone Encounter (Signed)
Patient notified and script sent to patient's pharm

## 2017-04-13 ENCOUNTER — Other Ambulatory Visit: Payer: Self-pay | Admitting: Internal Medicine

## 2017-04-13 DIAGNOSIS — M5010 Cervical disc disorder with radiculopathy, unspecified cervical region: Secondary | ICD-10-CM

## 2017-04-22 ENCOUNTER — Ambulatory Visit
Admission: RE | Admit: 2017-04-22 | Discharge: 2017-04-22 | Disposition: A | Discharge status: Home / Self Care | Payer: Medicare Other | Source: Ambulatory Visit | Attending: Internal Medicine | Admitting: Internal Medicine

## 2017-04-22 DIAGNOSIS — M5010 Cervical disc disorder with radiculopathy, unspecified cervical region: Secondary | ICD-10-CM | POA: Diagnosis not present

## 2017-04-22 DIAGNOSIS — M4802 Spinal stenosis, cervical region: Secondary | ICD-10-CM | POA: Insufficient documentation

## 2017-04-22 DIAGNOSIS — M50223 Other cervical disc displacement at C6-C7 level: Secondary | ICD-10-CM | POA: Insufficient documentation

## 2017-05-11 ENCOUNTER — Encounter: Payer: Self-pay | Admitting: Urology

## 2017-05-17 ENCOUNTER — Encounter: Payer: Self-pay | Admitting: Urology

## 2017-05-23 ENCOUNTER — Ambulatory Visit
Admission: RE | Admit: 2017-05-23 | Discharge: 2017-05-23 | Disposition: A | Discharge status: Home / Self Care | Payer: Medicare Other | Source: Ambulatory Visit | Attending: Gastroenterology | Admitting: Gastroenterology

## 2017-05-23 ENCOUNTER — Ambulatory Visit: Payer: Medicare Other | Admitting: Anesthesiology

## 2017-05-23 ENCOUNTER — Encounter
Admission: RE | Disposition: A | Discharge status: Home / Self Care | Payer: Self-pay | Source: Ambulatory Visit | Attending: Gastroenterology

## 2017-05-23 DIAGNOSIS — K2210 Ulcer of esophagus without bleeding: Secondary | ICD-10-CM | POA: Insufficient documentation

## 2017-05-23 DIAGNOSIS — K2960 Other gastritis without bleeding: Secondary | ICD-10-CM | POA: Diagnosis not present

## 2017-05-23 DIAGNOSIS — F419 Anxiety disorder, unspecified: Secondary | ICD-10-CM | POA: Insufficient documentation

## 2017-05-23 DIAGNOSIS — K2980 Duodenitis without bleeding: Secondary | ICD-10-CM | POA: Diagnosis not present

## 2017-05-23 DIAGNOSIS — Z86010 Personal history of colonic polyps: Secondary | ICD-10-CM | POA: Diagnosis present

## 2017-05-23 DIAGNOSIS — Z79899 Other long term (current) drug therapy: Secondary | ICD-10-CM | POA: Diagnosis not present

## 2017-05-23 DIAGNOSIS — Z87891 Personal history of nicotine dependence: Secondary | ICD-10-CM | POA: Diagnosis not present

## 2017-05-23 DIAGNOSIS — K635 Polyp of colon: Secondary | ICD-10-CM | POA: Diagnosis not present

## 2017-05-23 DIAGNOSIS — K3189 Other diseases of stomach and duodenum: Secondary | ICD-10-CM | POA: Diagnosis not present

## 2017-05-23 DIAGNOSIS — Z7982 Long term (current) use of aspirin: Secondary | ICD-10-CM | POA: Diagnosis not present

## 2017-05-23 DIAGNOSIS — F0390 Unspecified dementia without behavioral disturbance: Secondary | ICD-10-CM | POA: Insufficient documentation

## 2017-05-23 DIAGNOSIS — F329 Major depressive disorder, single episode, unspecified: Secondary | ICD-10-CM | POA: Diagnosis not present

## 2017-05-23 DIAGNOSIS — K210 Gastro-esophageal reflux disease with esophagitis: Secondary | ICD-10-CM | POA: Diagnosis not present

## 2017-05-23 DIAGNOSIS — I10 Essential (primary) hypertension: Secondary | ICD-10-CM | POA: Diagnosis not present

## 2017-05-23 HISTORY — PX: COLONOSCOPY WITH PROPOFOL: SHX5780

## 2017-05-23 HISTORY — PX: ESOPHAGOGASTRODUODENOSCOPY (EGD) WITH PROPOFOL: SHX5813

## 2017-05-23 SURGERY — COLONOSCOPY WITH PROPOFOL
Anesthesia: General

## 2017-05-23 MED ORDER — LIDOCAINE HCL (CARDIAC) 20 MG/ML IV SOLN
INTRAVENOUS | Status: DC | PRN
  Administered 2017-05-23: 80 mg via INTRAVENOUS

## 2017-05-23 MED ORDER — SODIUM CHLORIDE 0.9 % IV SOLN
INTRAVENOUS | Status: DC
  Administered 2017-05-23: 1000 mL via INTRAVENOUS
  Administered 2017-05-23: 13:00:00 via INTRAVENOUS

## 2017-05-23 MED ORDER — PHENYLEPHRINE HCL 10 MG/ML IJ SOLN
INTRAMUSCULAR | Status: DC | PRN
  Administered 2017-05-23: 200 ug via INTRAVENOUS
  Administered 2017-05-23: 300 ug via INTRAVENOUS
  Administered 2017-05-23 (×3): 200 ug via INTRAVENOUS
  Administered 2017-05-23: 100 ug via INTRAVENOUS
  Administered 2017-05-23: 200 ug via INTRAVENOUS
  Administered 2017-05-23: 100 ug via INTRAVENOUS
  Administered 2017-05-23: 50 ug via INTRAVENOUS
  Administered 2017-05-23 (×2): 200 ug via INTRAVENOUS

## 2017-05-23 MED ORDER — PROPOFOL 500 MG/50ML IV EMUL
INTRAVENOUS | Status: DC | PRN
  Administered 2017-05-23: 140 ug/kg/min via INTRAVENOUS

## 2017-05-23 MED ORDER — EPHEDRINE SULFATE 50 MG/ML IJ SOLN
INTRAMUSCULAR | Status: DC | PRN
  Administered 2017-05-23 (×2): 10 mg via INTRAVENOUS
  Administered 2017-05-23 (×2): 5 mg via INTRAVENOUS
  Administered 2017-05-23: 10 mg via INTRAVENOUS

## 2017-05-23 MED ORDER — PROPOFOL 500 MG/50ML IV EMUL
INTRAVENOUS | Status: AC
  Filled 2017-05-23: qty 50

## 2017-05-23 MED ORDER — MIDAZOLAM HCL 2 MG/2ML IJ SOLN
INTRAMUSCULAR | Status: AC
  Filled 2017-05-23: qty 2

## 2017-05-23 MED ORDER — MIDAZOLAM HCL 2 MG/2ML IJ SOLN
INTRAMUSCULAR | Status: DC | PRN
  Administered 2017-05-23: 1 mg via INTRAVENOUS

## 2017-05-23 MED ORDER — GLYCOPYRROLATE 0.2 MG/ML IJ SOLN
INTRAMUSCULAR | Status: DC | PRN
  Administered 2017-05-23: 0.2 mg via INTRAVENOUS

## 2017-05-23 MED ORDER — FENTANYL CITRATE (PF) 100 MCG/2ML IJ SOLN
INTRAMUSCULAR | Status: DC | PRN
  Administered 2017-05-23 (×2): 25 ug via INTRAVENOUS

## 2017-05-23 MED ORDER — PROPOFOL 10 MG/ML IV BOLUS
INTRAVENOUS | Status: DC | PRN
  Administered 2017-05-23: 70 mg via INTRAVENOUS
  Administered 2017-05-23: 10 mg via INTRAVENOUS
  Administered 2017-05-23 (×2): 20 mg via INTRAVENOUS

## 2017-05-23 MED ORDER — SODIUM CHLORIDE 0.9 % IV SOLN: INTRAVENOUS | Status: DC

## 2017-05-23 MED ORDER — FENTANYL CITRATE (PF) 100 MCG/2ML IJ SOLN
INTRAMUSCULAR | Status: AC
  Filled 2017-05-23: qty 2

## 2017-05-23 NOTE — Anesthesia Post-op Follow-up Note (Cosign Needed)
Anesthesia QCDR form completed.        

## 2017-05-23 NOTE — Anesthesia Preprocedure Evaluation (Signed)
Anesthesia Evaluation  Patient identified by MRN, date of birth, ID band Patient awake    Reviewed: Allergy & Precautions, NPO status , Patient's Chart, lab work & pertinent test results  Airway Mallampati: II       Dental  (+) Teeth Intact   Pulmonary former smoker,    breath sounds clear to auscultation       Cardiovascular Exercise Tolerance: Good hypertension, Pt. on home beta blockers  Rhythm:Regular     Neuro/Psych Anxiety Depression    GI/Hepatic negative GI ROS, Neg liver ROS,   Endo/Other  negative endocrine ROS  Renal/GU negative Renal ROS     Musculoskeletal negative musculoskeletal ROS (+)   Abdominal Normal abdominal exam  (+)   Peds negative pediatric ROS (+)  Hematology negative hematology ROS (+)   Anesthesia Other Findings   Reproductive/Obstetrics                             Anesthesia Physical Anesthesia Plan  ASA: II  Anesthesia Plan: General   Post-op Pain Management:    Induction: Intravenous  PONV Risk Score and Plan: 0  Airway Management Planned: Natural Airway and Nasal Cannula  Additional Equipment:   Intra-op Plan:   Post-operative Plan:   Informed Consent: I have reviewed the patients History and Physical, chart, labs and discussed the procedure including the risks, benefits and alternatives for the proposed anesthesia with the patient or authorized representative who has indicated his/her understanding and acceptance.     Plan Discussed with: CRNA  Anesthesia Plan Comments:         Anesthesia Quick Evaluation

## 2017-05-23 NOTE — Op Note (Signed)
[facility] Gastroenterology Patient Name: Marvin Jefferson Procedure Date: 05/23/2017 12:42 PM MRN: [id number] Account #: [id number] Date of Birth: [birth year] Admit Type: Outpatient Age: 76 Room: [facility] ENDO ROOM 1 Gender: Male Note Status: Finalized Procedure:            Colonoscopy Indications:          Personal history of colonic polyps Providers:            Lollie Sails, MD Referring MD:         Rusty Aus, MD (Referring MD) Medicines:            Monitored Anesthesia Care Complications:        No immediate complications. Procedure:            Pre-Anesthesia Assessment:                       - ASA Grade Assessment: II - A patient with mild                        systemic disease.                       After obtaining informed consent, the colonoscope was                        passed under direct vision. Throughout the procedure,                        the patient's blood pressure, pulse, and oxygen                        saturations were monitored continuously. The                        Colonoscope was introduced through the anus and                        advanced to the the cecum, identified by appendiceal                        orifice and ileocecal valve. The colonoscopy was                        performed without difficulty. The patient tolerated the                        procedure well. The quality of the bowel preparation                        was fair. 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      A 4 mm polyp was found in the proximal descending colon. The polyp was       semi-pedunculated. The polyp was removed with a cold snare. Resection       and retrieval were complete.      A 2 mm polyp was found in the proximal descending colon. The polyp was       sessile. The polyp was removed with a cold biopsy forceps. Resection and  retrieval were complete.      The  retroflexed view of the distal rectum and anal verge was normal and       showed no anal or rectal abnormalities.      The digital rectal exam was normal. Impression:           - Preparation of the colon was fair.                       - Two 2 to 4 mm, non-bleeding polyps in the proximal                        descending colon, removed with a cold biopsy forceps                        and removed with a cold snare. Resected and retrieved. Recommendation:       - Discharge patient to home.                       - Await pathology results.                       - Telephone GI clinic for pathology results in 1 week. Procedure Code(s):    --- Professional ---                       [phone], Colonoscopy, flexible; with removal of tumor(s),                        polyp(s), or other lesion(s) by snare technique                       [id number], 48, Colonoscopy, flexible; with biopsy, single                        or multiple Diagnosis Code(s):    --- Professional ---                       D12.4, Benign neoplasm of descending colon                       Z86.010, Personal history of colonic polyps CPT copyright 2016 American Medical Association. All rights reserved. The codes documented in this report are preliminary and upon coder review may  be revised to meet current compliance requirements. Lollie Sails, MD 05/23/2017 1:56:12 PM This report has been signed electronically. Number of Addenda: 0 Note Initiated On: 05/23/2017 12:42 PM Scope Withdrawal Time: 0 hours 19 minutes 58 seconds  Total Procedure Duration: 0 hours 34 minutes 36 seconds       [facility]

## 2017-05-23 NOTE — Op Note (Signed)
[facility] Gastroenterology Patient Name: Marvin Jefferson Procedure Date: 05/23/2017 12:43 PM MRN: [id number] Account #: [id number] Date of Birth: 1940/10/24 Admit Type: Outpatient Age: 76 Room: [facility] ENDO ROOM 1 Gender: Male Note Status: Finalized Procedure:            Upper GI endoscopy Indications:          Dysphagia Providers:            Lollie Sails, MD Referring MD:         Rusty Aus, MD (Referring MD) Medicines:            Monitored Anesthesia Care Complications:        No immediate complications. Procedure:            Pre-Anesthesia Assessment:                       - ASA Grade Assessment: II - A patient with mild                        systemic disease.                       After obtaining informed consent, the endoscope was                        passed under direct vision. Throughout the procedure,                        the patient's blood pressure, pulse, and oxygen                        saturations were monitored continuously. The Endoscope                        was introduced through the mouth, and advanced to the                        third part of duodenum. The upper GI endoscopy was                        accomplished without difficulty. The patient tolerated                        the procedure well. Findings:      LA Grade B (one or more mucosal breaks greater than 5 mm, not extending       between the tops of two mucosal folds) esophagitis with no bleeding was       found. There is an area of irregular mucosa associated with a sharp       angulation in the distal esophagus just before entry into the gastric       vault. There is no actual ring noted. The scope enters the stomach       without difficulty. Biopsies were taken with a cold forceps for       histology.      Patchy mild mucosal changes characterized by altered texture were found       in the gastric antrum, on the anterior wall of the gastric antrum and on       the  posterior wall of the gastric antrum. Biopsies were taken with a  cold forceps for histology.      The cardia and gastric fundus were normal on retroflexion.      Patchy mild inflammation characterized by congestion (edema) and       erythema was found in the duodenal bulb.      Patchy mild inflammation characterized by adherent blood, congestion       (edema) and erosions was found in the gastric body and in the gastric       antrum. Impression:           - LA Grade B esophagitis. Biopsied.                       - Texture changed mucosa in the antrum, anterior wall                        of the gastric antrum and posterior wall of the gastric                        antrum. Biopsied.                       - Duodenitis.                       - Erosive gastritis. Recommendation:       - Use Protonix (pantoprazole) 40 mg PO BID for 1 month.                       - Use Protonix (pantoprazole) 40 mg PO daily daily.                       - Repeat upper endoscopy in 7 weeks to evaluate the                        response to therapy. Procedure Code(s):    --- Professional ---                       [phone], Esophagogastroduodenoscopy, flexible, transoral;                        with biopsy, single or multiple Diagnosis Code(s):    --- Professional ---                       K20.9, Esophagitis, unspecified                       K31.89, Other diseases of stomach and duodenum                       K29.80, Duodenitis without bleeding                       K29.60, Other gastritis without bleeding                       R13.10, Dysphagia, unspecified CPT copyright 2016 American Medical Association. All rights reserved. The codes documented in this report are preliminary and upon coder review may  be revised to meet current compliance requirements. Lollie Sails, MD 05/23/2017 1:11:18 PM This report has been signed electronically. Number of Addenda: 0 Note Initiated On: 05/23/2017 12:43 PM  [facility]

## 2017-05-23 NOTE — H&P (Signed)
Outpatient short stay form Pre-procedure 05/23/2017 12:35 PM Marvin Sails MD  Primary Physician: Dr. Emily Filbert  Reason for visit:  EGD and colonoscopy  History of present illness:  Patient is a 76 year old male presenting today as above. Has a personal history of adenomatous colon polyps. He is also been having some difficulty for about a year with some dysphagia. He is not regurgitating foods and is times his dysphagia is even occasionally to liquids. He had a barium swallow on 01/20/2017 that was straining a fairly sharp turn in the extreme distal esophagus prior to entry into the gastric vault. This is where the tablet seemed to hang. There is no overt stenosis noted such as a ring. He tolerated his prep well. He takes no aspirin or blood thinning agents with the exception of 81 mg aspirin.    Current Facility-Administered Medications:  .  0.9 %  sodium chloride infusion, , Intravenous, Continuous, Marvin Sails, MD, Last Rate: 20 mL/hr at 05/23/17 1210, 1,000 mL at 05/23/17 1210 .  0.9 %  sodium chloride infusion, , Intravenous, Continuous, Marvin Sails, MD  Prescriptions Prior to Admission  Medication Sig Dispense Refill Last Dose  . acetaminophen (TYLENOL) 500 MG tablet Take 1,000 mg by mouth every 6 (six) hours as needed for mild pain.    Taking  . ALPRAZolam (XANAX) 0.25 MG tablet Take 0.25 mg by mouth 2 (two) times daily as needed (dizziness).    Taking  . amLODipine (NORVASC) 5 MG tablet Take 5 mg by mouth 2 (two) times daily.    05/23/2017 at 0900  . etodolac (LODINE) 400 MG tablet Take 400 mg by mouth daily.    Past Week  . FLUoxetine (PROZAC) 20 MG tablet Take 20 mg by mouth daily.   05/23/2017 at 0900  . meclizine (ANTIVERT) 25 MG tablet Take 25 mg by mouth 3 (three) times daily as needed for dizziness.   Taking  . sulfamethoxazole-trimethoprim (BACTRIM DS,SEPTRA DS) 800-160 MG tablet Take 1 tablet by mouth 2 (two) times daily. 14 tablet 0 Past Week at Unknown time   . terazosin (HYTRIN) 5 MG capsule Take 5 mg by mouth 2 (two) times daily.    05/23/2017 at 0900  . trimethoprim (TRIMPEX) 100 MG tablet Take 1 tablet (100 mg total) by mouth daily. 90 tablet 1 05/23/2017 at 0900  . amoxicillin-clavulanate (AUGMENTIN) 875-125 MG tablet Take 1 tablet by mouth every 12 (twelve) hours. 14 tablet 0   . ampicillin (PRINCIPEN) 500 MG capsule Take 1 capsule (500 mg total) by mouth 4 (four) times daily. (Patient not taking: Reported on 01/07/2017) 28 capsule 0 Not Taking  . aspirin EC 81 MG tablet Take 81 mg by mouth daily.    Taking  . Cholecalciferol (VITAMIN D3) 2000 units capsule Take 2,000 Units by mouth daily.    Taking  . cyanocobalamin (V-R VITAMIN B-12) 500 MCG tablet Take 500 mcg by mouth 2 (two) times daily.    Taking  . docusate sodium (COLACE) 100 MG capsule Take 100 mg by mouth daily.    Taking  . fluticasone (FLONASE) 50 MCG/ACT nasal spray USE 2 SPRAYS IN EACH NOSTRIL EVERY DAY AS NEEDED FOR ALLERGIES   Not Taking at Unknown time  . galantamine (RAZADYNE) 12 MG tablet Take 12 mg by mouth 2 (two) times daily.    12/06/2016 at 0730  . Glucosamine HCl 1500 MG TABS Take 1 tablet by mouth 2 (two) times daily.   Not Taking  . HYDROcodone-acetaminophen (  NORCO/VICODIN) 5-325 MG tablet Take 1-2 tablets by mouth every 6 (six) hours as needed for moderate pain. (Patient not taking: Reported on 01/07/2017) 10 tablet 0 Not Taking at Unknown time  . hydrocortisone cream 1 % Apply 1 application topically daily as needed for itching.   Taking  . Melatonin 5 MG CAPS Take 1 capsule by mouth at bedtime.   Taking  . methocarbamol (ROBAXIN) 500 MG tablet Take 500 mg by mouth 3 (three) times daily as needed for muscle spasms.   Not Taking  . Multiple Vitamins-Minerals (MULTIVITAMIN WITH MINERALS) tablet Take 1 tablet by mouth daily.   Taking  . neomycin-bacitracin-polymyxin (NEOSPORIN) ointment Apply 1 application topically daily as needed for wound care. apply to eye   Taking  .  Omega-3 Fatty Acids (FISH OIL) 1000 MG CAPS Take 1,000 mg by mouth daily.    Taking  . Probiotic Product (PROBIOTIC ADVANCED PO) Take 1 capsule by mouth daily.   Taking  . sodium chloride (OCEAN) 0.65 % SOLN nasal spray Place 2 sprays into both nostrils 2 (two) times daily as needed for congestion.   Taking  . traMADol (ULTRAM) 50 MG tablet Take 50 mg by mouth every 6 (six) hours as needed for moderate pain.    Taking  . vitamin E 1000 UNIT capsule Take 1,000 Units by mouth daily.    Taking     Allergies  Allergen Reactions  . Clarithromycin Other (See Comments)    Terrible taste and smell     Past Medical History:  Diagnosis Date  . Anxiety   . Chronic back pain   . DDD (degenerative disc disease), cervical   . DDD (degenerative disc disease), lumbar   . Dementia   . Depression   . Hypertension   . Meniere's disease   . Pneumonia 2015  . Umbilical hernia   . UTI (urinary tract infection)     Review of systems:      Physical Exam    Heart and lungs: Regular rate and rhythm without rub or gallop, lungs are bilaterally clear.    HEENT: Normocephalic atraumatic eyes are anicteric    Other:     Pertinant exam for procedure: Soft nontender nondistended bowel sounds positive normoactive.    Planned proceedures: EGD and colonoscopy with indicated procedures. I have discussed the risks benefits and complications of procedures to include not limited to bleeding, infection, perforation and the risk of sedation and the patient wishes to proceed.    Marvin Sails, MD Gastroenterology 05/23/2017  12:35 PM

## 2017-05-23 NOTE — Anesthesia Procedure Notes (Signed)
Date/Time: 05/23/2017 12:45 PM Performed by: Hedda Slade Pre-anesthesia Checklist: Patient identified, Emergency Drugs available, Suction available and Patient being monitored Patient Re-evaluated:Patient Re-evaluated prior to induction Oxygen Delivery Method: Nasal cannula

## 2017-05-23 NOTE — Transfer of Care (Signed)
Immediate Anesthesia Transfer of Care Note  Patient: Marvin Jefferson  Procedure(s) Performed: Procedure(s): COLONOSCOPY WITH PROPOFOL (N/A) ESOPHAGOGASTRODUODENOSCOPY (EGD) WITH PROPOFOL (N/A)  Patient Location: PACU  Anesthesia Type:General  Level of Consciousness: awake, alert  and oriented  Airway & Oxygen Therapy: Patient Spontanous Breathing and Patient connected to nasal cannula oxygen  Post-op Assessment: Report given to RN and Post -op Vital signs reviewed and stable  Post vital signs: Reviewed and stable  Last Vitals:  Vitals:   05/23/17 1355 05/23/17 1356  BP: 98/66 98/66  Pulse:  95  Resp:  20  Temp: (!) 35.6 C (!) 35.6 C    Last Pain:  Vitals:   05/23/17 1356  TempSrc: Tympanic  PainSc:          Complications: No apparent anesthesia complications

## 2017-05-24 ENCOUNTER — Encounter: Payer: Self-pay | Admitting: Gastroenterology

## 2017-05-24 LAB — SURGICAL PATHOLOGY

## 2017-05-24 NOTE — Anesthesia Postprocedure Evaluation (Signed)
Anesthesia Post Note  Patient: Marvin Jefferson  Procedure(s) Performed: Procedure(s) (LRB): COLONOSCOPY WITH PROPOFOL (N/A) ESOPHAGOGASTRODUODENOSCOPY (EGD) WITH PROPOFOL (N/A)  Patient location during evaluation: PACU Anesthesia Type: General Level of consciousness: awake Pain management: pain level controlled Vital Signs Assessment: post-procedure vital signs reviewed and stable Respiratory status: spontaneous breathing Cardiovascular status: stable Anesthetic complications: no     Last Vitals:  Vitals:   05/23/17 1355 05/23/17 1356  BP: 98/66 98/66  Pulse:  95  Resp:  20  Temp: (!) 35.6 C (!) 35.6 C    Last Pain:  Vitals:   05/23/17 1356  TempSrc: Tympanic  PainSc:                  VAN STAVEREN,[name]

## 2017-07-08 ENCOUNTER — Encounter: Payer: Self-pay | Admitting: Urology

## 2017-07-08 ENCOUNTER — Ambulatory Visit (INDEPENDENT_AMBULATORY_CARE_PROVIDER_SITE_OTHER): Payer: Medicare Other | Admitting: Urology

## 2017-07-08 VITALS — BP 106/72 | HR 84 | Ht 67.0 in | Wt 193.0 lb

## 2017-07-08 DIAGNOSIS — N390 Urinary tract infection, site not specified: Secondary | ICD-10-CM

## 2017-07-08 DIAGNOSIS — R339 Retention of urine, unspecified: Secondary | ICD-10-CM

## 2017-07-08 DIAGNOSIS — N401 Enlarged prostate with lower urinary tract symptoms: Secondary | ICD-10-CM

## 2017-07-08 DIAGNOSIS — N138 Other obstructive and reflux uropathy: Secondary | ICD-10-CM | POA: Diagnosis not present

## 2017-07-08 LAB — BLADDER SCAN AMB NON-IMAGING: Scan Result: 152

## 2017-07-08 NOTE — Progress Notes (Signed)
07/08/2017 10:49 AM   Marvin Jefferson 1941-04-10 [id number]  Referring provider: Rusty Aus, MD [facility] [place], [place] [address]  No chief complaint on file.   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  Following surgery, he's had recurrent urinary tract infections and continue to need to clean intermittent catheter. When catheterizing, he noted resistance at the bladder neck. 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  Since last visit, he is done remarkably well. He is voiding spontaneously with good flow. He feels like he is able to empty his bladder for the most part. He has been catheterizing once every evening and brings along with him today. This demonstrates PVRs in the range of 175-200 cc but no significantly higher volumes than this. The catheter was able to be passed easily.  He has recently completed a dose of the suppressive trimethoprim 3 months. During this time period, his urine has remained clear. He has no dysuria, blood, or any other complaints.  Overall, he is very satisfied with his voiding. IPSS as below.  He does note today that he's had aggressive worsening of his dementia which is bothersome to him. He also notes heat intolerance.   He is currently on terazosin which he takes for his blood pressure.      IPSS    Row Name 07/08/17 0900         International Prostate Symptom Score   How often have you had the sensation of not emptying your bladder? Not at All     How often have you had to urinate less than every two hours? Not at All     How often have you found you stopped and started again several times when you urinated? Less than half the time     How often have you found it difficult to postpone urination? Less than half the time     How often  have you had a weak urinary stream? Less than 1 in 5 times     How often have you had to strain to start urination? Less than 1 in 5 times     How many times did you typically get up at night to urinate? 2 Times     Total IPSS Score 8       Quality of Life due to urinary symptoms   If you were to spend the rest of your life with your urinary condition just the way it is now how would you feel about that? Mostly Satisfied        Score:  1-7 Mild 8-19 Moderate 20-35 Severe   PMH: Past Medical History:  Diagnosis Date  . Anxiety   . Chronic back pain   . DDD (degenerative disc disease), cervical   . DDD (degenerative disc disease), lumbar   . Dementia   . Depression   . Hypertension   . Meniere's disease   . Pneumonia 2015  . Umbilical hernia   . UTI (urinary tract infection)     Surgical History: Past Surgical History:  Procedure Laterality Date  . APPENDECTOMY    . COLONOSCOPY WITH PROPOFOL N/A 05/23/2017   Procedure: COLONOSCOPY WITH PROPOFOL;  Surgeon: Lollie Sails, MD;  Location: [facility] ENDOSCOPY;  Service: Endoscopy;  Laterality: N/A;  . ESOPHAGOGASTRODUODENOSCOPY (EGD) WITH PROPOFOL N/A 05/23/2017  Procedure: ESOPHAGOGASTRODUODENOSCOPY (EGD) WITH PROPOFOL;  Surgeon: Lollie Sails, MD;  Location: [facility] ENDOSCOPY;  Service: Endoscopy;  Laterality: N/A;  . GANGLION CYST EXCISION Right    wrist  . HERNIA REPAIR     umbilical hernia  . 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 N/A 08/23/2016   Procedure: 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;  Surgeon: Hollice Espy, MD;  Location: [facility] ORS;  Service: Urology;  Laterality: N/A;  . 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 N/A 12/06/2016   Procedure: 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;  Surgeon: Hollice Espy, MD;  Location: [facility] ORS;  Service: Urology;  Laterality: N/A;    Home Medications:  Allergies as of 07/08/2017      Reactions    Clarithromycin Other (See Comments)   Terrible taste and smell      Medication List       Accurate as of 07/08/17 10:49 AM. Always use your most recent med list.          acetaminophen 500 MG tablet Commonly known as:  TYLENOL Take 1,000 mg by mouth every 6 (six) hours as needed for mild pain.   ALPRAZolam 0.25 MG tablet Commonly known as:  XANAX Take 0.25 mg by mouth 2 (two) times daily as needed (dizziness).   amLODipine 5 MG tablet Commonly known as:  NORVASC Take 5 mg by mouth 2 (two) times daily.   aspirin EC 81 MG tablet Take 81 mg by mouth daily.   docusate sodium 100 MG capsule Commonly known as:  COLACE Take 100 mg by mouth daily.   etodolac 400 MG tablet Commonly known as:  LODINE Take 400 mg by mouth daily.   Fish Oil 1000 MG Caps Take 1,000 mg by mouth daily.   FLUoxetine 20 MG tablet Commonly known as:  PROZAC Take 20 mg by mouth daily.   fluticasone 50 MCG/ACT nasal spray Commonly known as:  FLONASE USE 2 SPRAYS IN EACH NOSTRIL EVERY DAY AS NEEDED FOR ALLERGIES   gabapentin 100 MG capsule Commonly known as:  NEURONTIN Take 1 capsule by mouth 4 (four) times daily.   galantamine 12 MG tablet Commonly known as:  RAZADYNE Take 12 mg by mouth 2 (two) times daily.   hydrocortisone cream 1 % Apply 1 application topically daily as needed for itching.   meclizine 25 MG tablet Commonly known as:  ANTIVERT Take 25 mg by mouth 3 (three) times daily as needed for dizziness.   Melatonin 5 MG Caps Take 1 capsule by mouth at bedtime.   methocarbamol 500 MG tablet Commonly known as:  ROBAXIN Take 500 mg by mouth 3 (three) times daily as needed for muscle spasms.   methylphenidate 5 MG tablet Commonly known as:  RITALIN Take 1 tablet by mouth 2 (two) times daily.   multivitamin with minerals tablet Take 1 tablet by mouth daily.   neomycin-bacitracin-polymyxin ointment Commonly known as:  NEOSPORIN Apply 1 application topically daily as needed  for wound care. apply to eye   PROBIOTIC ADVANCED PO Take 1 capsule by mouth daily.   sodium chloride 0.65 % Soln nasal spray Commonly known as:  OCEAN Place 2 sprays into both nostrils 2 (two) times daily as needed for congestion.   terazosin 5 MG capsule Commonly known as:  HYTRIN Take 5 mg by mouth 2 (two) times daily.   traMADol 50 MG tablet Commonly known as:  ULTRAM Take 50 mg by mouth every 6 (six) hours as needed for moderate  pain.   V-R VITAMIN B-12 500 MCG tablet Generic drug:  cyanocobalamin Take 500 mcg by mouth 2 (two) times daily.   Vitamin D3 2000 units capsule Take 2,000 Units by mouth daily.   vitamin E 1000 UNIT capsule Take 1,000 Units by mouth daily.            Discharge Care Instructions        Start     Ordered   07/08/17 0000  Bladder Scan (Post Void Residual) in office     07/08/17 0937      Allergies:  Allergies  Allergen Reactions  . Clarithromycin Other (See Comments)    Terrible taste and smell    Family History: Family History  Problem Relation Age of Onset  . Prostate cancer Father   . Hypertension Father   . Stroke Father   . Hypertension Mother   . Dementia Mother   . Bladder Cancer Neg Hx   . Kidney cancer Neg Hx     Social History:  reports that he quit smoking about 54 years ago. He has never used smokeless tobacco. He reports that he does not drink alcohol or use drugs.  ROS: UROLOGY Frequent Urination?: No Hard to postpone urination?: No Burning/pain with urination?: No Get up at night to urinate?: Yes Leakage of urine?: No Urine stream starts and stops?: No Trouble starting stream?: No Do you have to strain to urinate?: No Blood in urine?: No Urinary tract infection?: No Sexually transmitted disease?: No Injury to kidneys or bladder?: No Painful intercourse?: No Weak stream?: No Erection problems?: No Penile pain?: No  Gastrointestinal Nausea?: Yes Vomiting?: No Indigestion/heartburn?:  No Diarrhea?: No Constipation?: No  Constitutional Fever: No Night sweats?: No Weight loss?: No Fatigue?: No  Skin Skin rash/lesions?: No Itching?: No  Eyes Blurred vision?: No Double vision?: No  Ears/Nose/Throat Sore throat?: No Sinus problems?: No  Hematologic/Lymphatic Swollen glands?: No Easy bruising?: No  Cardiovascular Leg swelling?: No Chest pain?: No  Respiratory Cough?: No Shortness of breath?: No  Endocrine Excessive thirst?: No  Musculoskeletal Back pain?: Yes Joint pain?: Yes  Neurological Headaches?: No Dizziness?: No  Psychologic Depression?: Yes Anxiety?: No  Physical Exam: BP 106/72 (BP Location: Left Arm, Patient Position: Sitting, Cuff Size: Normal)   Pulse 84   Ht 5\' 7"  (1.702 m)   Wt 193 lb (87.5 kg)   BMI 30.23 kg/m   Constitutional:  Alert and oriented, No acute distress.  HEENT: [place] AT, moist mucus membranes.  Trachea midline, no masses. Cardiovascular: No clubbing, cyanosis, or edema. Respiratory: Normal respiratory effort, no increased work of breathing. GI: Abdomen is soft, nontender, nondistended, no abdominal masses Skin: No rashes, bruises or suspicious lesions. Neurologic: Grossly intact, no focal deficits, moving all 4 extremities. Psychiatric: Normal mood and affect.  Laboratory Data: Lab Results  Component Value Date   WBC 14.4 (H) 12/22/2016   HGB 15.1 12/22/2016   HCT 43.9 12/22/2016   MCV 92.8 12/22/2016   PLT 193 12/22/2016   Cr 0.9 on 02/2017  Urinalysis N/a  Pertinent Imaging: Results for orders placed or performed in visit on 07/08/17  Bladder Scan (Post Void Residual) in office  Result Value Ref Range   Scan Result 152     Assessment & Plan:    1. BPH with obstruction/lower urinary tract symptoms s/p HoLEP with return for excision of retained lobe Voiding now spontanesouly - BLADDER SCAN AMB NON-IMAGING  2. Incomplete bladder emptying PVRs mildly elevated to 100-200 range without  stones, Cr stable Given that he is voiding spontaneously otherwise and has no complaints today, ok to DC self catheter in the evenings based on voiding diary reviewed today (this was discussed as well last visit but ? Forgot) Will continue to follow - self cath as needed  3. Recurrent UTI No recent infection, currently asymptomatic Completed trimethoprim x 3 months recently which has helped Return if develops symptoms   Return in about 6 months (around 01/05/2018) for pvr, ipss, ua.  Hollice Espy, MD  [facility] [address] [place], [place], [place] [address] [phone]

## 2017-10-25 ENCOUNTER — Ambulatory Visit: Payer: Medicare Other | Attending: Neurology

## 2017-10-25 DIAGNOSIS — G4733 Obstructive sleep apnea (adult) (pediatric): Secondary | ICD-10-CM | POA: Diagnosis not present

## 2017-10-25 DIAGNOSIS — R0683 Snoring: Secondary | ICD-10-CM | POA: Diagnosis not present

## 2017-11-08 ENCOUNTER — Ambulatory Visit: Payer: Medicare Other

## 2017-11-15 ENCOUNTER — Ambulatory Visit: Payer: Medicare Other | Attending: Neurology

## 2017-11-15 DIAGNOSIS — G4733 Obstructive sleep apnea (adult) (pediatric): Secondary | ICD-10-CM | POA: Diagnosis present

## 2017-11-18 ENCOUNTER — Ambulatory Visit: Admit: 2017-11-18 | Payer: Medicare Other | Admitting: Gastroenterology

## 2017-11-18 SURGERY — ESOPHAGOGASTRODUODENOSCOPY (EGD) WITH PROPOFOL
Anesthesia: General

## 2018-01-06 ENCOUNTER — Ambulatory Visit: Payer: Medicare Other | Admitting: Urology

## 2018-01-06 ENCOUNTER — Encounter: Payer: Self-pay | Admitting: Urology

## 2018-01-06 VITALS — BP 138/79 | HR 74 | Ht 67.0 in | Wt 203.0 lb

## 2018-01-06 DIAGNOSIS — N390 Urinary tract infection, site not specified: Secondary | ICD-10-CM | POA: Diagnosis not present

## 2018-01-06 DIAGNOSIS — R339 Retention of urine, unspecified: Secondary | ICD-10-CM | POA: Diagnosis not present

## 2018-01-06 DIAGNOSIS — N400 Enlarged prostate without lower urinary tract symptoms: Secondary | ICD-10-CM | POA: Diagnosis not present

## 2018-01-06 LAB — MICROSCOPIC EXAMINATION

## 2018-01-06 LAB — URINALYSIS, COMPLETE
BILIRUBIN UA: NEGATIVE
Glucose, UA: NEGATIVE
Ketones, UA: NEGATIVE
Nitrite, UA: POSITIVE — AB
PH UA: 5.5 (ref 5.0–7.5)
PROTEIN UA: NEGATIVE
RBC UA: NEGATIVE
SPEC GRAV UA: 1.025 (ref 1.005–1.030)
Urobilinogen, Ur: 0.2 mg/dL (ref 0.2–1.0)

## 2018-01-06 LAB — BLADDER SCAN AMB NON-IMAGING

## 2018-01-06 NOTE — Progress Notes (Signed)
01/06/2018 10:06 AM   Marvin Jefferson September 08, 1941 [id number]  Referring provider: Rusty Aus, MD [facility] [place], [place] [address]  Chief Complaint  Patient presents with  . 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    Today, he is extremely pleased with his urinary symptoms.   He brings in graphs with him today demonstrating post void residuals which he checks on approximately weekly basis to ensure that he is not retaining urine.  Postvoid residuals range from anywhere from 100-250 cc with voided volumes always greater than this.  PVR 194 cc, stable from visit 07/08/17.    He continues to take terazosin for blood pressure.  Most recent PSA drawn by PCP on 02/2017 1.42.  His UAs are chronically positive but he remains asymptomatic.  IPSS    Row Name 01/06/18 0900         International Prostate Symptom Score   How often have you had the sensation of not emptying your bladder?  Not at All     How often have you had to urinate less than every two hours?  Not at All     How often have you found you stopped and started again several times when you urinated?  Not at All     How often have you found it difficult to postpone urination?  Not at All     How often have you had a weak urinary stream?  Less than 1 in 5 times     How often have you had to strain to start urination?  Less than 1 in 5 times     How many times did you typically get up at night to urinate?  2 Times     Total IPSS Score  4       Quality of Life due to urinary symptoms   If you were to spend the rest of your life with your urinary condition just the way it is now how would you feel about that?  Delighted         Score:  1-7 Mild 8-19 Moderate 20-35 Severe   PMH: Past Medical History:  Diagnosis Date  . Anxiety   . Chronic back pain   . DDD (degenerative disc disease), cervical   . DDD (degenerative disc disease), lumbar   . Dementia   . Depression   . Hypertension   . Meniere's disease   . Pneumonia 2015  . Umbilical hernia   . UTI (urinary tract infection)     Surgical History: Past Surgical History:  Procedure Laterality Date  . APPENDECTOMY    . COLONOSCOPY WITH PROPOFOL N/A 05/23/2017   Procedure: COLONOSCOPY WITH PROPOFOL;  Surgeon: Lollie Sails, MD;  Location: [facility] ENDOSCOPY;  Service: Endoscopy;  Laterality: N/A;  . ESOPHAGOGASTRODUODENOSCOPY (EGD) WITH PROPOFOL N/A 05/23/2017   Procedure: ESOPHAGOGASTRODUODENOSCOPY (EGD) WITH PROPOFOL;  Surgeon: Lollie Sails, MD;  Location: [facility] ENDOSCOPY;  Service: Endoscopy;  Laterality: N/A;  . GANGLION CYST EXCISION Right    wrist  . HERNIA REPAIR     umbilical hernia  . 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 N/A 08/23/2016   Procedure: 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;  Surgeon:  Hollice Espy, MD;  Location: [facility] ORS;  Service: Urology;  Laterality: N/A;  . 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 N/A 12/06/2016   Procedure: 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;  Surgeon: Hollice Espy, MD;  Location: [facility] ORS;  Service: Urology;  Laterality: N/A;    Home Medications:  Allergies as of 01/06/2018      Reactions   Clarithromycin Other (See Comments)   Terrible taste and smell      Medication List        Accurate as of 01/06/18 10:06 AM. Always use your most recent med list.          acetaminophen 500 MG tablet Commonly known as:  TYLENOL Take 1,000 mg by mouth every 6 (six) hours as needed for mild pain.   ALPRAZolam 0.25 MG tablet Commonly known as:  XANAX Take 0.25 mg by mouth 2 (two) times daily as needed (dizziness).    amLODipine 5 MG tablet Commonly known as:  NORVASC Take 5 mg by mouth 2 (two) times daily.   aspirin EC 81 MG tablet Take 81 mg by mouth daily.   docusate sodium 100 MG capsule Commonly known as:  COLACE Take 100 mg by mouth daily.   etodolac 400 MG tablet Commonly known as:  LODINE Take 400 mg by mouth daily.   Fish Oil 1000 MG Caps Take 1,000 mg by mouth daily.   FLUoxetine 20 MG tablet Commonly known as:  PROZAC Take 20 mg by mouth daily.   fluticasone 50 MCG/ACT nasal spray Commonly known as:  FLONASE USE 2 SPRAYS IN EACH NOSTRIL EVERY DAY AS NEEDED FOR ALLERGIES   gabapentin 100 MG capsule Commonly known as:  NEURONTIN Take 1 capsule by mouth 4 (four) times daily.   galantamine 12 MG tablet Commonly known as:  RAZADYNE Take 12 mg by mouth 2 (two) times daily.   hydrocortisone cream 1 % Apply 1 application topically daily as needed for itching.   meclizine 25 MG tablet Commonly known as:  ANTIVERT Take 25 mg by mouth 3 (three) times daily as needed for dizziness.   Melatonin 5 MG Caps Take 1 capsule by mouth at bedtime.   methocarbamol 500 MG tablet Commonly known as:  ROBAXIN Take 500 mg by mouth 3 (three) times daily as needed for muscle spasms.   methylphenidate 5 MG tablet Commonly known as:  RITALIN Take 1 tablet by mouth 2 (two) times daily.   multivitamin with minerals tablet Take 1 tablet by mouth daily.   neomycin-bacitracin-polymyxin ointment Commonly known as:  NEOSPORIN Apply 1 application topically daily as needed for wound care. apply to eye   PROBIOTIC ADVANCED PO Take 1 capsule by mouth daily.   sodium chloride 0.65 % Soln nasal spray Commonly known as:  OCEAN Place 2 sprays into both nostrils 2 (two) times daily as needed for congestion.   terazosin 5 MG capsule Commonly known as:  HYTRIN Take 5 mg by mouth 2 (two) times daily.   traMADol 50 MG tablet Commonly known as:  ULTRAM Take 50 mg by mouth every 6 (six) hours as  needed for moderate pain.   V-R VITAMIN B-12 500 MCG tablet Generic drug:  cyanocobalamin Take 500 mcg by mouth 2 (two) times daily.   Vitamin D3 2000 units capsule Take 2,000 Units by mouth daily.   vitamin E 1000 UNIT capsule Take 1,000 Units by mouth daily.       Allergies:  Allergies  Allergen Reactions  . Clarithromycin  Other (See Comments)    Terrible taste and smell    Family History: Family History  Problem Relation Age of Onset  . Prostate cancer Father   . Hypertension Father   . Stroke Father   . Hypertension Mother   . Dementia Mother   . Bladder Cancer Neg Hx   . Kidney cancer Neg Hx     Social History:  reports that he quit smoking about 55 years ago. He has never used smokeless tobacco. He reports that he does not drink alcohol or use drugs.  ROS: UROLOGY Frequent Urination?: No Hard to postpone urination?: No Burning/pain with urination?: No Get up at night to urinate?: No Leakage of urine?: No Urine stream starts and stops?: No Trouble starting stream?: No Do you have to strain to urinate?: No Blood in urine?: No Urinary tract infection?: No Sexually transmitted disease?: No Injury to kidneys or bladder?: No Painful intercourse?: No Weak stream?: No Erection problems?: No Penile pain?: No  Gastrointestinal Nausea?: No Vomiting?: No Indigestion/heartburn?: No Diarrhea?: No Constipation?: No  Constitutional Fever: No Night sweats?: No Weight loss?: No Fatigue?: No  Skin Skin rash/lesions?: No Itching?: No  Eyes Blurred vision?: No Double vision?: No  Ears/Nose/Throat Sore throat?: No Sinus problems?: No  Hematologic/Lymphatic Swollen glands?: No Easy bruising?: No  Cardiovascular Leg swelling?: No Chest pain?: No  Respiratory Cough?: No Shortness of breath?: No  Endocrine Excessive thirst?: No  Musculoskeletal Back pain?: No Joint pain?: Yes  Neurological Headaches?: No Dizziness?:  No  Psychologic Depression?: No Anxiety?: No  Physical Exam: BP 138/79   Pulse 74   Ht 5\' 7"  (1.702 m)   Wt 203 lb (92.1 kg)   BMI 31.79 kg/m   Constitutional:  Alert and oriented, No acute distress. HEENT: [place] AT, moist mucus membranes.  Trachea midline, no masses. Cardiovascular: No clubbing, cyanosis, or edema. Respiratory: Normal respiratory effort, no increased work of breathing. Skin: No rashes, bruises or suspicious lesions. Neurologic: Grossly intact, no focal deficits, moving all 4 extremities. Psychiatric: Normal mood and affect.  Laboratory Data: Cr 0.9 on 03/11/18  Urinalysis UA today with 6-10 white blood cells per high-power field, 0-2 red blood cells per high-power field, nitrate positive, many bacteria consistent with colonization.  Pertinent Imaging: Results for orders placed or performed in visit on 01/06/18  Microscopic Examination  Result Value Ref Range   WBC, UA 6-10 (A) 0 - 5 /hpf   RBC, UA 0-2 0 - 2 /hpf   Epithelial Cells (non renal) 0-10 0 - 10 /hpf   Mucus, UA Present (A) Not Estab.   Bacteria, UA Many (A) None seen/Few  Urinalysis, Complete  Result Value Ref Range   Specific Gravity, UA 1.025 1.005 - 1.030   pH, UA 5.5 5.0 - 7.5   Color, UA Yellow Yellow   Appearance Ur Cloudy (A) Clear   Leukocytes, UA 1+ (A) Negative   Protein, UA Negative Negative/Trace   Glucose, UA Negative Negative   Ketones, UA Negative Negative   RBC, UA Negative Negative   Bilirubin, UA Negative Negative   Urobilinogen, Ur 0.2 0.2 - 1.0 mg/dL   Nitrite, UA Positive (A) Negative   Microscopic Examination See below:   BLADDER SCAN AMB NON-IMAGING  Result Value Ref Range   Scan Result 175ml      Assessment & Plan:    1. BPH with obstruction/lower urinary tract symptoms s/p HoLEP with return for excision of retained lobe Excellent results with minimal residual urinary symptoms -  BLADDER SCAN AMB NON-IMAGING  2. Incomplete bladder emptying PVRs mildly  elevated to 100-200 range without stones, Cr stable No indication for clean intermittent cath at this point in time We will follow clinically-he would like to occasionally check post void residuals by self cath which is reasonable especially if he develops any symptoms  3. 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  Hollice Espy, MD  [facility] [address], [place], [place] [address] [phone]

## 2018-01-16 IMAGING — RF DG ESOPHAGUS
9 series · 12 of 12 positions shown · non-contrast
Comparison: None.

CLINICAL DATA: Intermittent episodes of esophageal spasm and
inability to swallow.

EXAM:
ESOPHOGRAM / BARIUM SWALLOW / BARIUM TABLET STUDY
TECHNIQUE: Combined double contrast and single contrast examination performed
using effervescent crystals, thick barium liquid, and thin barium
liquid. The patient was observed with fluoroscopy swallowing a 13 mm
barium sulphate tablet.
FLUOROSCOPY TIME:  Fluoroscopy Time:  1 minutes
Radiation Exposure Index (if provided by the fluoroscopic device):
766 micro Gy per meters squared
Number of Acquired Spot Images: 8 +1 video loop

[Series 1: fluoro_barium 2fps_bw · 0.18mm/px · 1 of 1 slices shown (1 of 9)]
[im 1/1]
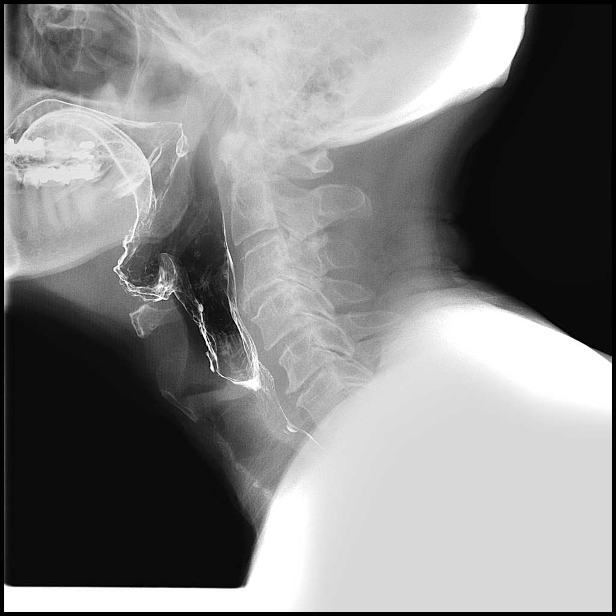

[Series 2: fluoro_barium 2fps_bw · 0.17mm/px · 1 of 1 slices shown (2 of 9)]
[im 1/1]
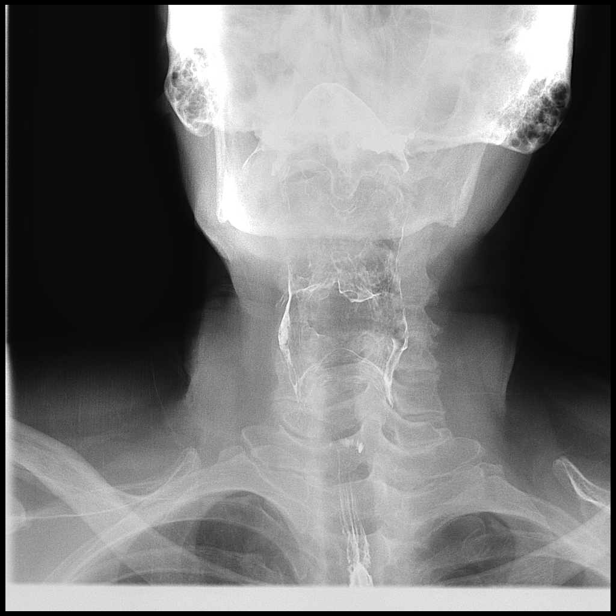

[Series 3: fluoro_barium 2fps_bw · 0.17mm/px · 1 of 1 slices shown (3 of 9)]
[im 1/1]
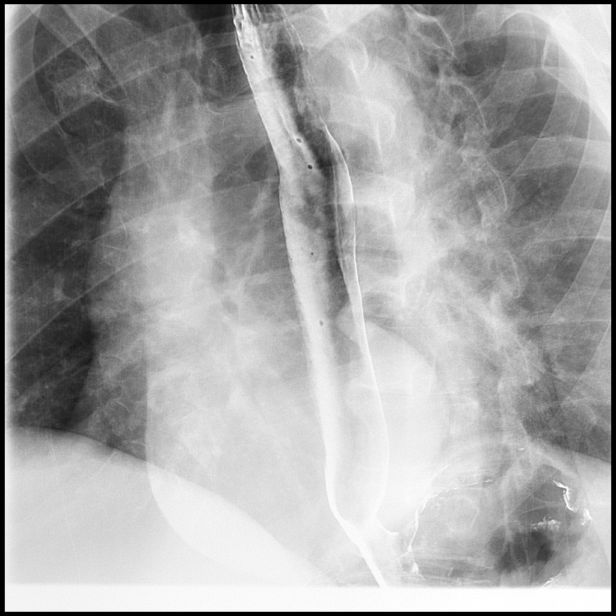

[Series 4: fluoro_barium 2fps_bw · 0.17mm/px · 1 of 1 slices shown (4 of 9)]
[im 1/1]
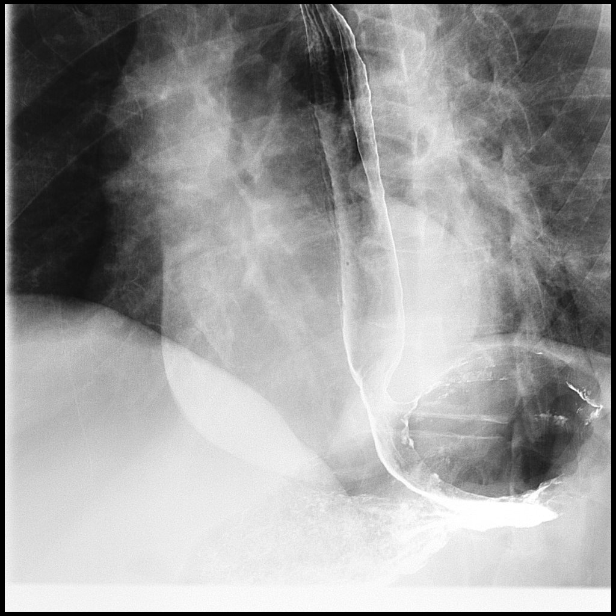

[Series 5: fluoro_barium 2fps_bw · 0.18mm/px · 1 of 1 slices shown (5 of 9)]
[im 1/1]
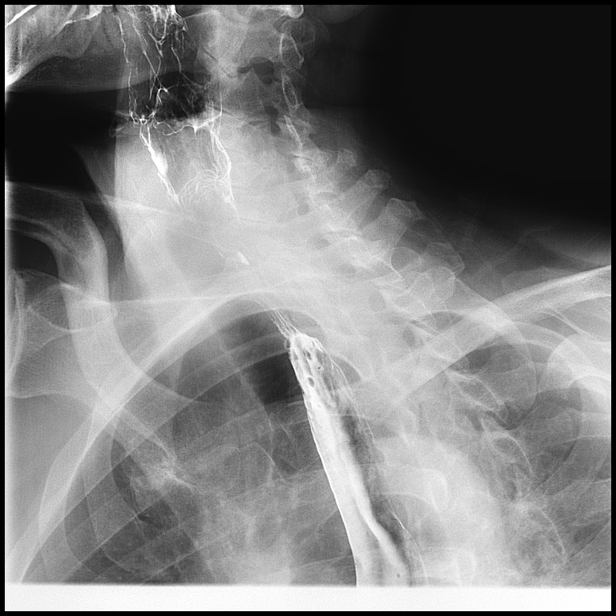

[Series 6: fluoro_barium 2fps_bw · 0.17mm/px · 4 of 18 frames shown (6 of 9)]
[frame 3/18]
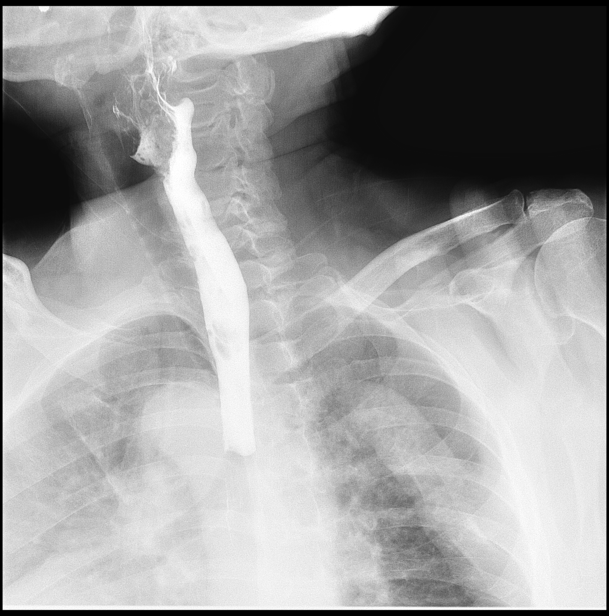
[frame 10/18]
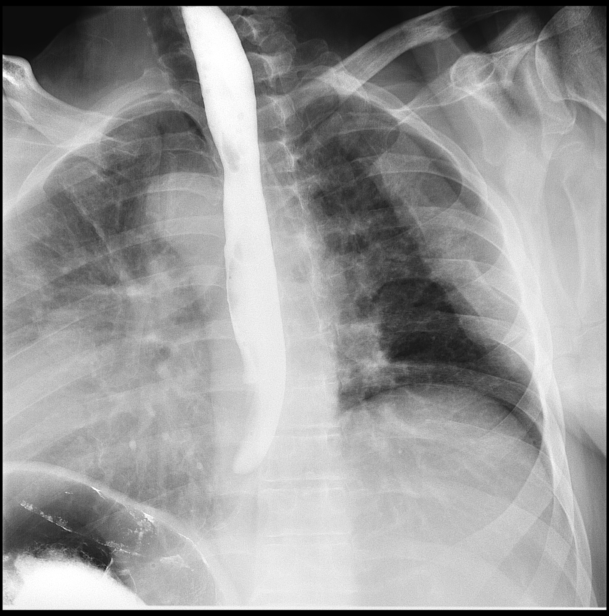
[frame 11/18]
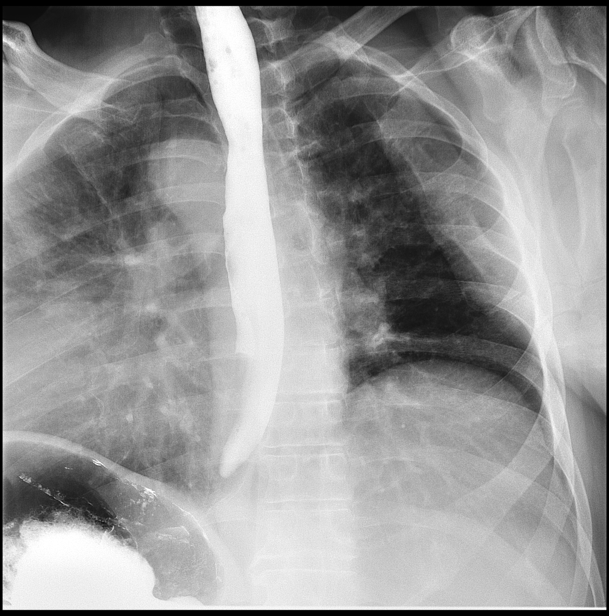
[frame 16/18]
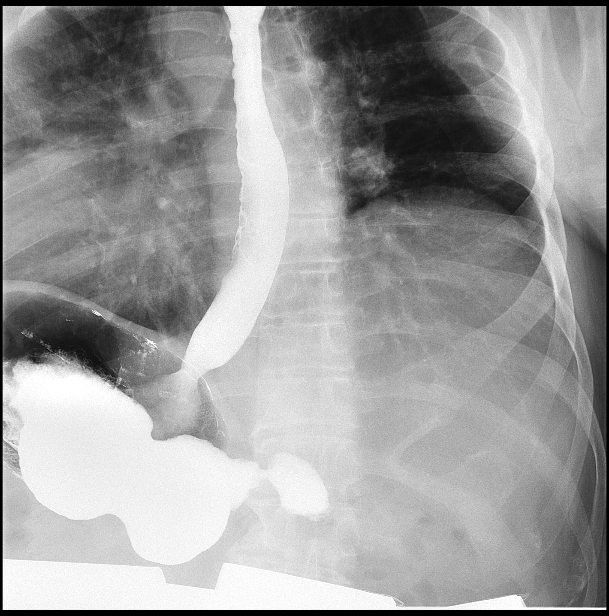

[Series 7: fluoro_barium 2fps_bw · 0.18mm/px · 1 of 1 slices shown (7 of 9)]
[im 1/1]
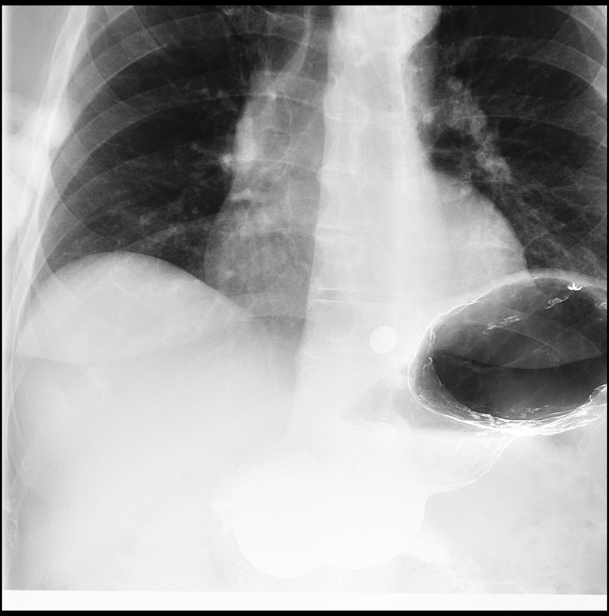

[Series 8: fluoro_barium 2fps_bw · 0.17mm/px · 1 of 1 slices shown (8 of 9)]
[im 1/1]
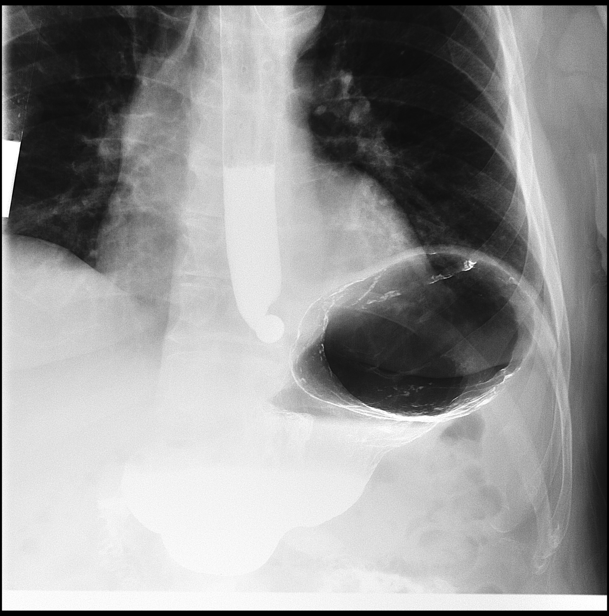

[Series 9: fluoro_barium 2fps_bw · 0.17mm/px · 1 of 1 slices shown (9 of 9)]
[im 1/1]
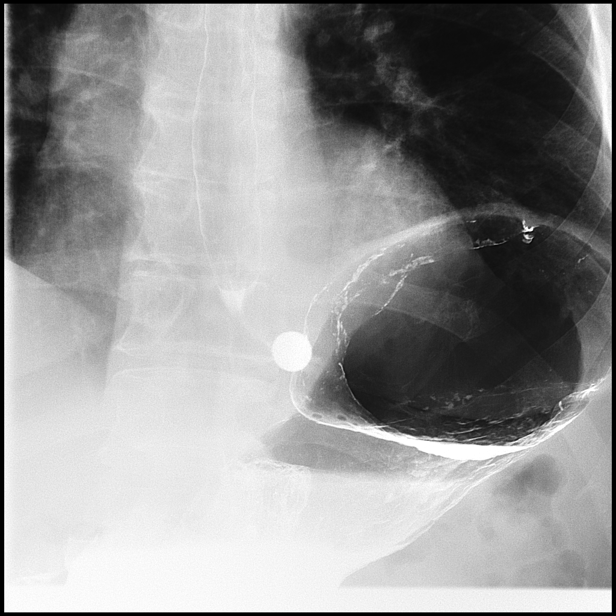

[12 of 12 positions shown; findings below may reference images not displayed]

FINDINGS: The patient ingested the thick and thin barium and gas forming
crystals without difficulty. The cervical esophagus distended well.
There was no laryngeal penetration of the barium. The thoracic
esophagus distended well down to the GE junction. Here there was
mild symmetric short-segment narrowing. The barium tablet hung here
but was ultimately propelled into the stomach with additional sips
of barium and water. There were no significant changes of
presbyesophagus. No ulceration or mass in the esophagus was
observed. No gastroesophageal reflux was observed.
IMPRESSION: Short-segment narrowing of the distal esophagus without visible
mucosal irregularity. Direct visualization is recommended with
consideration for possible esophageal dilation. Elsewhere the
esophagus is normal in appearance and function.

## 2018-03-05 ENCOUNTER — Encounter: Payer: Self-pay | Admitting: Emergency Medicine

## 2018-03-05 ENCOUNTER — Other Ambulatory Visit: Payer: Self-pay

## 2018-03-05 ENCOUNTER — Emergency Department
Admission: EM | Admit: 2018-03-05 | Discharge: 2018-03-05 | Disposition: A | Discharge status: Home / Self Care | Payer: Medicare Other | Attending: Emergency Medicine | Admitting: Emergency Medicine

## 2018-03-05 ENCOUNTER — Emergency Department: Payer: Medicare Other

## 2018-03-05 DIAGNOSIS — R002 Palpitations: Secondary | ICD-10-CM | POA: Diagnosis not present

## 2018-03-05 DIAGNOSIS — Z7982 Long term (current) use of aspirin: Secondary | ICD-10-CM | POA: Diagnosis not present

## 2018-03-05 DIAGNOSIS — I10 Essential (primary) hypertension: Secondary | ICD-10-CM | POA: Insufficient documentation

## 2018-03-05 DIAGNOSIS — Z87891 Personal history of nicotine dependence: Secondary | ICD-10-CM | POA: Insufficient documentation

## 2018-03-05 DIAGNOSIS — G301 Alzheimer's disease with late onset: Secondary | ICD-10-CM | POA: Insufficient documentation

## 2018-03-05 DIAGNOSIS — Z79899 Other long term (current) drug therapy: Secondary | ICD-10-CM | POA: Diagnosis not present

## 2018-03-05 LAB — BASIC METABOLIC PANEL
ANION GAP: 8 (ref 5–15)
BUN: 25 mg/dL — ABNORMAL HIGH (ref 6–20)
CHLORIDE: 106 mmol/L (ref 101–111)
CO2: 25 mmol/L (ref 22–32)
Calcium: 9.1 mg/dL (ref 8.9–10.3)
Creatinine, Ser: 0.88 mg/dL (ref 0.61–1.24)
GFR calc Af Amer: >60 mL/min (ref >60)
GLUCOSE: 91 mg/dL (ref 65–99)
POTASSIUM: 3.5 mmol/L (ref 3.5–5.1)
Sodium: 139 mmol/L (ref 135–145)

## 2018-03-05 LAB — MAGNESIUM: Magnesium: 2.1 mg/dL (ref 1.7–2.4)

## 2018-03-05 LAB — CBC
HEMATOCRIT: 41.4 % (ref 40.0–52.0)
Hemoglobin: 14.3 g/dL (ref 13.0–18.0)
MCH: 32.2 pg (ref 26.0–34.0)
MCHC: 34.6 g/dL (ref 32.0–36.0)
MCV: 93.1 fL (ref 80.0–100.0)
Platelets: 194 10*3/uL (ref 150–440)
RBC: 4.45 MIL/uL (ref 4.40–5.90)
RDW: 13.3 % (ref 11.5–14.5)
WBC: 5.1 10*3/uL (ref 3.8–10.6)

## 2018-03-05 LAB — TROPONIN I: Troponin I: <0.03 ng/mL (ref <0.03)

## 2018-03-05 NOTE — ED Triage Notes (Signed)
Pt to ED via POV stating that he is having chest pain that started this morning when he woke up around 0600. Pt states that the pain is located on the left side of his chest, pt denies radiation of the pain. Pt denies any other symptoms at this time. Pt is in NAD.

## 2018-03-05 NOTE — ED Provider Notes (Signed)
[facility] Emergency Department Provider Note  ___________________________________________   First MD Initiated Contact with Patient 03/05/18 0932     (approximate)  I have reviewed the triage vital signs and the nursing notes.   HISTORY  Chief Complaint Chest Pain   HPI Marvin Jefferson is a 77 y.o. male history of anxiety as well as hypertension and previous palpitations is presenting to the emergency department with palpitations for about 50 minutes this morning.  He says that shortly after waking up at 7 AM he started feeling an irregular heartbeat.  He then confirmed this on his android watch.  He said that his heart was not beating quickly but that there were irregular beats separated by pauses.  He says that the symptoms resolved.  Says that he has had issues like this in the past and has taken magnesium to help.  Denies any chest pain at this time.  Also said that he recently started CPAP overnight for sleep apnea.   Past Medical History:  Diagnosis Date  . Anxiety   . Chronic back pain   . DDD (degenerative disc disease), cervical   . DDD (degenerative disc disease), lumbar   . Dementia   . Depression   . Hypertension   . Meniere's disease   . Pneumonia 2015  . Umbilical hernia   . UTI (urinary tract infection)     Patient Active Problem List   Diagnosis Date Noted  . 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 Benign essential hypertension 09/16/2016  . BPH with obstruction/lower urinary tract symptoms 09/16/2016  . Medicare annual wellness visit, initial 09/16/2016  . Lumbar disc disease 02/28/2014  . Meniere's disease 02/28/2014  . AR (allergic rhinitis) 02/27/2014  . Cervical disc disease 02/27/2014  . HTN (hypertension) 02/27/2014  . Late onset Alzheimer's disease without behavioral disturbance 02/27/2014  . OA (osteoarthritis) 02/27/2014    Past Surgical History:  Procedure Laterality Date  . APPENDECTOMY    . COLONOSCOPY WITH PROPOFOL  N/A 05/23/2017   Procedure: COLONOSCOPY WITH PROPOFOL;  Surgeon: Lollie Sails, MD;  Location: [facility] ENDOSCOPY;  Service: Endoscopy;  Laterality: N/A;  . ESOPHAGOGASTRODUODENOSCOPY (EGD) WITH PROPOFOL N/A 05/23/2017   Procedure: ESOPHAGOGASTRODUODENOSCOPY (EGD) WITH PROPOFOL;  Surgeon: Lollie Sails, MD;  Location: [facility] ENDOSCOPY;  Service: Endoscopy;  Laterality: N/A;  . GANGLION CYST EXCISION Right    wrist  . HERNIA REPAIR     umbilical hernia  . 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 N/A 08/23/2016   Procedure: 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;  Surgeon: Hollice Espy, MD;  Location: [facility] ORS;  Service: Urology;  Laterality: N/A;  . 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 N/A 12/06/2016   Procedure: 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;  Surgeon: Hollice Espy, MD;  Location: [facility] ORS;  Service: Urology;  Laterality: N/A;    Prior to Admission medications   Medication Sig Start Date End Date Taking? Authorizing Provider  acetaminophen (TYLENOL) 500 MG tablet Take 1,000 mg by mouth every 6 (six) hours as needed for mild pain.     [provider]  ALPRAZolam Duanne Moron) 0.25 MG tablet Take 0.25 mg by mouth 2 (two) times daily as needed (dizziness).  04/07/16   [provider]  amLODipine (NORVASC) 5 MG tablet Take 5 mg by mouth 2 (two) times daily.  06/23/16   [provider]  aspirin EC 81 MG tablet Take 81 mg by mouth daily.     [provider]  Cholecalciferol (VITAMIN D3) 2000 units capsule Take 2,000 Units by mouth daily.     [provider]  cyanocobalamin (V-R VITAMIN B-12) 500 MCG tablet Take 500 mcg by mouth 2 (two) times daily.     [provider]  docusate sodium (COLACE) 100 MG capsule Take 100 mg by mouth daily.     [provider]  etodolac (LODINE) 400 MG tablet Take 400 mg by mouth daily.     [provider]  FLUoxetine  (PROZAC) 20 MG tablet Take 20 mg by mouth daily.    [provider]  fluticasone (FLONASE) 50 MCG/ACT nasal spray USE 2 SPRAYS IN EACH NOSTRIL EVERY DAY AS NEEDED FOR ALLERGIES 05/04/16   [provider]  gabapentin (NEURONTIN) 100 MG capsule Take 1 capsule by mouth 4 (four) times daily. 04/13/17 04/13/18  [provider]  galantamine (RAZADYNE) 12 MG tablet Take 12 mg by mouth 2 (two) times daily.  06/11/16 11/26/16  [provider]  hydrocortisone cream 1 % Apply 1 application topically daily as needed for itching.    [provider]  meclizine (ANTIVERT) 25 MG tablet Take 25 mg by mouth 3 (three) times daily as needed for dizziness.    [provider]  Melatonin 5 MG CAPS Take 1 capsule by mouth at bedtime.    [provider]  methocarbamol (ROBAXIN) 500 MG tablet Take 500 mg by mouth 3 (three) times daily as needed for muscle spasms.    [provider]  methylphenidate (RITALIN) 5 MG tablet Take 1 tablet by mouth 2 (two) times daily. 06/10/17 07/10/17  [provider]  Multiple Vitamins-Minerals (MULTIVITAMIN WITH MINERALS) tablet Take 1 tablet by mouth daily.    [provider]  neomycin-bacitracin-polymyxin (NEOSPORIN) ointment Apply 1 application topically daily as needed for wound care. apply to eye    [provider]  Omega-3 Fatty Acids (FISH OIL) 1000 MG CAPS Take 1,000 mg by mouth daily.     [provider]  Probiotic Product (PROBIOTIC ADVANCED PO) Take 1 capsule by mouth daily.    [provider]  sodium chloride (OCEAN) 0.65 % SOLN nasal spray Place 2 sprays into both nostrils 2 (two) times daily as needed for congestion.    [provider]  terazosin (HYTRIN) 5 MG capsule Take 5 mg by mouth 2 (two) times daily.  03/08/16   [provider]  traMADol (ULTRAM) 50 MG tablet Take 50 mg by mouth every 6 (six) hours as needed for moderate pain.  03/08/16   [provider]  vitamin E 1000 UNIT capsule Take 1,000 Units by mouth daily.     [provider]    Allergies Clarithromycin  Family History  Problem Relation Age of Onset  . Prostate cancer Father   . Hypertension Father   . Stroke Father   . Hypertension Mother   . Dementia Mother   . Bladder Cancer Neg Hx   . Kidney cancer Neg Hx     Social History Social History   Tobacco Use  . Smoking status: Former Smoker    Last attempt to quit: 08/02/1962    Years since quitting: 55.6  . Smokeless tobacco: Never Used  Substance Use Topics  . Alcohol use: No  . Drug use: No    Review of Systems  Constitutional: No fever/chills Eyes: No visual changes. ENT: No sore throat. Cardiovascular: As above Respiratory: Denies shortness of breath. Gastrointestinal: No abdominal pain.  No nausea, no vomiting.  No diarrhea.  No constipation. Genitourinary: Negative for dysuria. Musculoskeletal: Negative for back pain. Skin: Negative for rash. Neurological: Negative for headaches, focal weakness or numbness.   ____________________________________________   PHYSICAL EXAM:  VITAL SIGNS: ED Triage Vitals  Enc Vitals Group     BP 03/05/18 0737 (!) 166/78     Pulse Rate 03/05/18 0737 61     Resp 03/05/18 0737 16     Temp 03/05/18 0737 98 F (36.7 C)     Temp Source 03/05/18 0737 Oral     SpO2 03/05/18 0737 97 %     Weight 03/05/18 0735 196 lb (88.9 kg)     Height 03/05/18 0735 5\' 8"  (1.727 m)     Head Circumference --      Peak Flow --      Pain Score 03/05/18 0735 2     Pain Loc --      Pain Edu? --      Excl. in [place]? --     Constitutional: Alert and oriented. Well appearing and in no acute distress. Eyes: Conjunctivae are normal.  Head: Atraumatic. Nose: No congestion/rhinnorhea. Mouth/Throat: Mucous membranes are moist.  Neck: No stridor.   Cardiovascular: Normal rate, regular rhythm. Grossly normal heart sounds.  Respiratory: Normal respiratory effort.  No  retractions. Lungs CTAB. Gastrointestinal: Soft and nontender. No distention.  Musculoskeletal: No lower extremity tenderness nor edema.  No joint effusions. Neurologic:  Normal speech and language. No gross focal neurologic deficits are appreciated. Skin:  Skin is warm, dry and intact. No rash noted. Psychiatric: Mood and affect are normal. Speech and behavior are normal.  ____________________________________________   LABS (all labs ordered are listed, but only abnormal results are displayed)  Labs Reviewed  BASIC METABOLIC PANEL - Abnormal; Notable for the following components:      Result Value   BUN 25 (*)    All other components within normal limits  CBC  TROPONIN I  TROPONIN I  MAGNESIUM   ____________________________________________  EKG  ED ECG REPORT I, Doran Stabler, the attending physician, personally viewed and interpreted this ECG.   Date: 03/05/2018  EKG Time: 0735  Rate: 57  Rhythm: Sinus bradycardia  Axis: Normal  Intervals:none  ST&T Change: No ST segment elevation or depression.  No abnormal T wave inversion.  ____________________________________________  RADIOLOGY  No acute finding on the chest x-ray ____________________________________________   PROCEDURES  Procedure(s) performed:   Procedures  Critical Care performed:   ____________________________________________   INITIAL IMPRESSION / ASSESSMENT AND PLAN / ED COURSE  Pertinent labs & imaging results that were available during my care of the patient were reviewed by me and considered in my medical decision making (see chart for details).  Differential diagnosis includes, but is not limited to, ACS, aortic dissection, pulmonary embolism, cardiac tamponade, pneumothorax, pneumonia, pericarditis, myocarditis, GI-related causes including esophagitis/gastritis, and musculoskeletal chest wall pain.   As part of my medical decision making, I reviewed the following data within the  electronic MEDICAL RECORD NUMBER Notes from outpatient visits.  ----------------------------------------- 11:09 AM on 03/05/2018 -----------------------------------------  Patient with reassuring repeat troponin as well as normal magnesium.  Patient continues to be without further complaint.  Will be discharged home.  I discussed cardiology follow-up with patient for possible further testing including Holter monitoring.  He is understanding of the diagnosis as well as treatment plan and willing to comply. ____________________________________________   FINAL CLINICAL IMPRESSION(S) / ED DIAGNOSES  Palpitations    NEW MEDICATIONS STARTED DURING THIS  VISIT:  New Prescriptions   No medications on file     Note:  This document was prepared using Dragon voice recognition software and may include unintentional dictation errors.    Orbie Pyo, MD 03/05/18 [phone]

## 2018-03-28 IMAGING — US US SCROTUM
1 series · 14 of 25 positions shown · non-contrast
Comparison: None.

CLINICAL DATA: Left testicular swelling x2 days, recurrent urinary
tract infection

EXAM:
ULTRASOUND OF SCROTUM
TECHNIQUE: Complete ultrasound examination of the testicles, epididymis, and
other scrotal structures was performed.

[Series 1: us scrotum · 0.08mm/px · 14 of 69 slices shown]
[im 1/69]
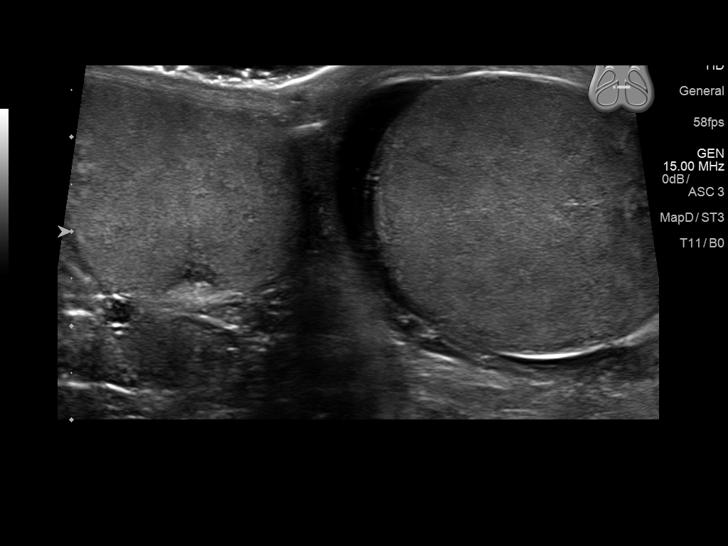
[im 6/69]
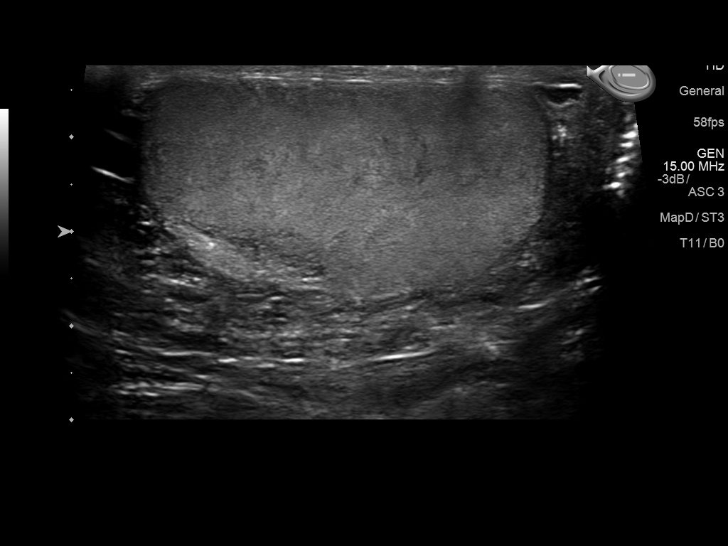
[im 12/69]
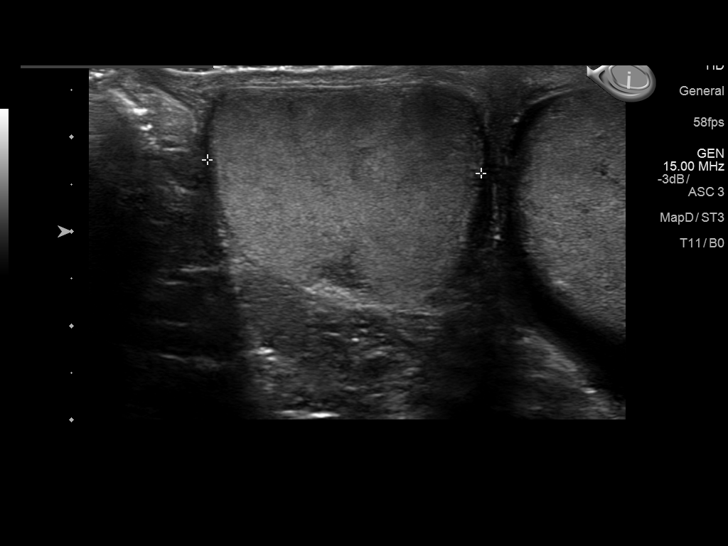
[im 18/69]
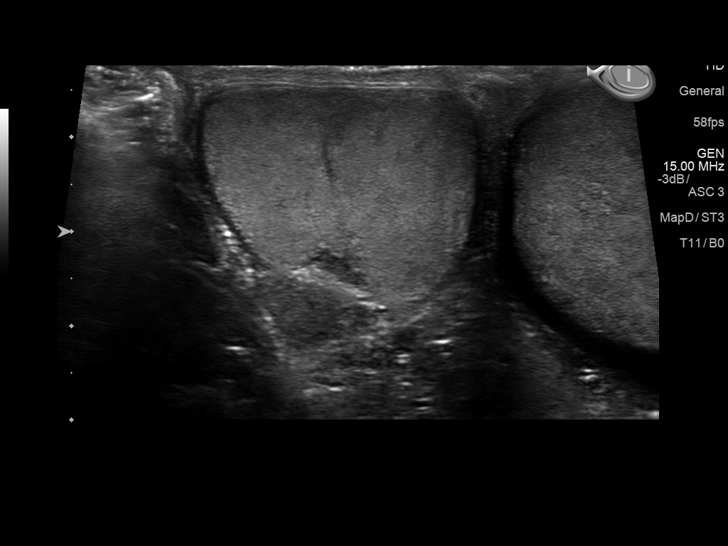
[im 23/69]
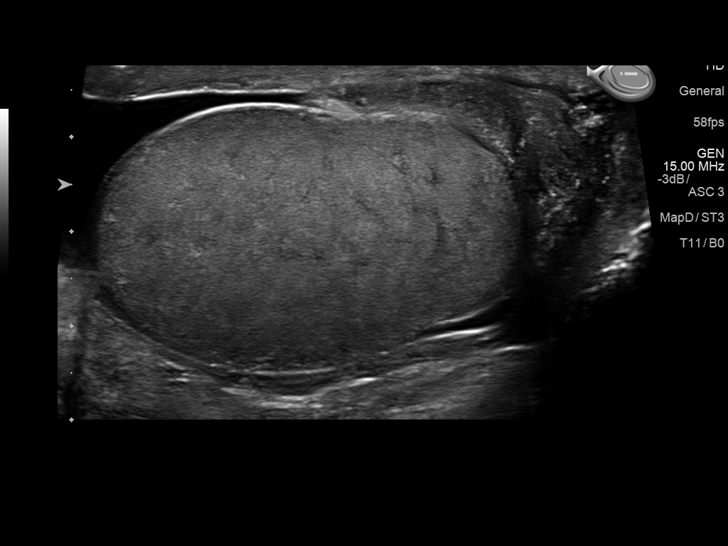
[im 26/69]
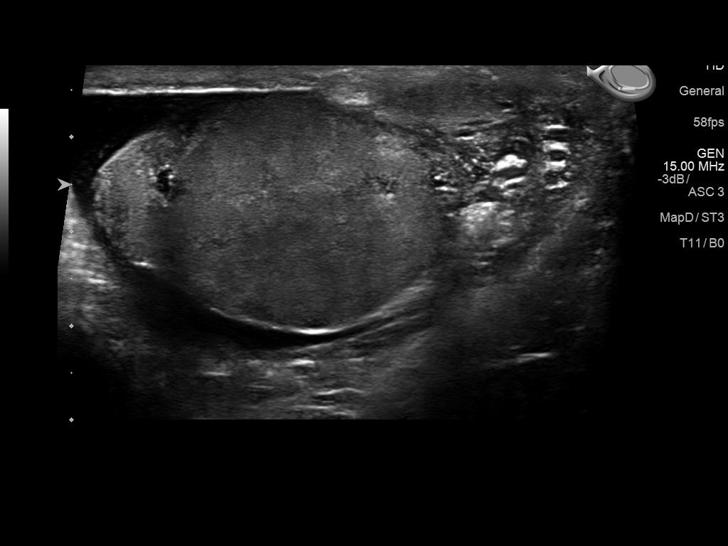
[im 32/69]
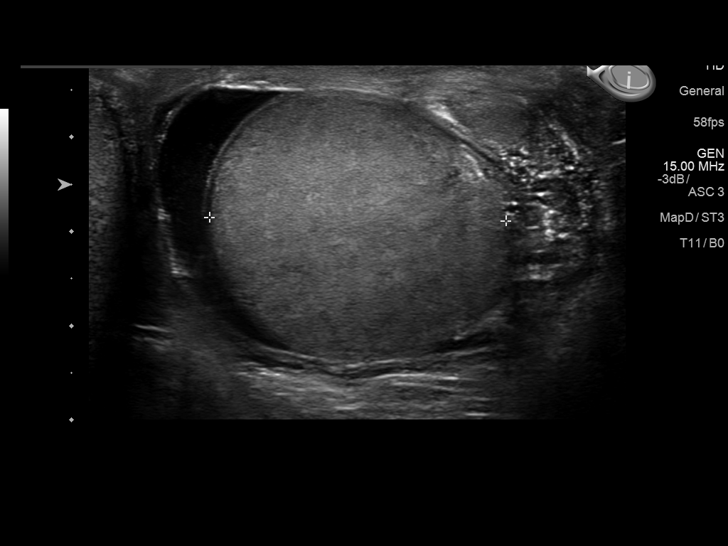
[im 37/69]
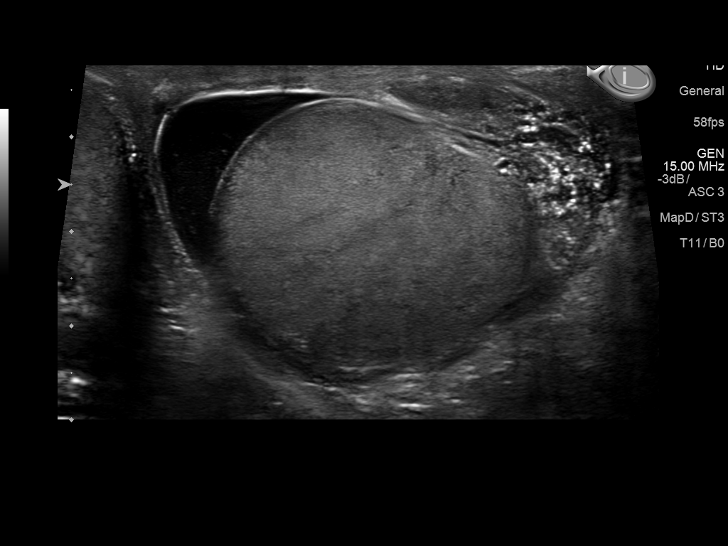
[im 43/69]
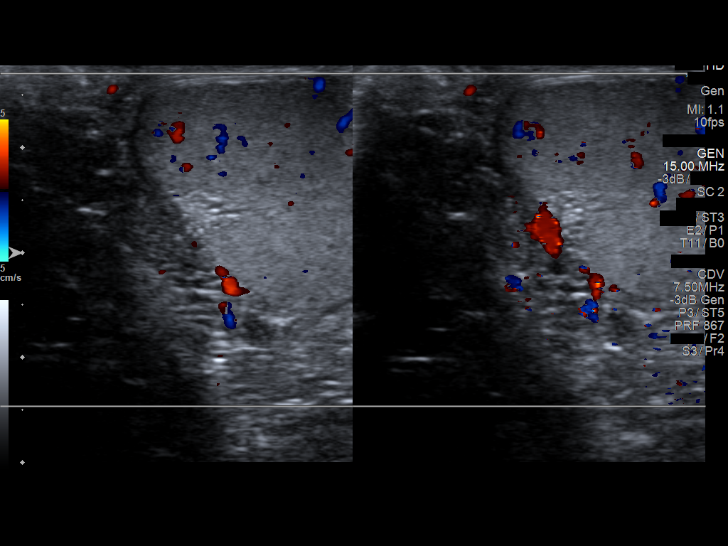
[im 46/69]
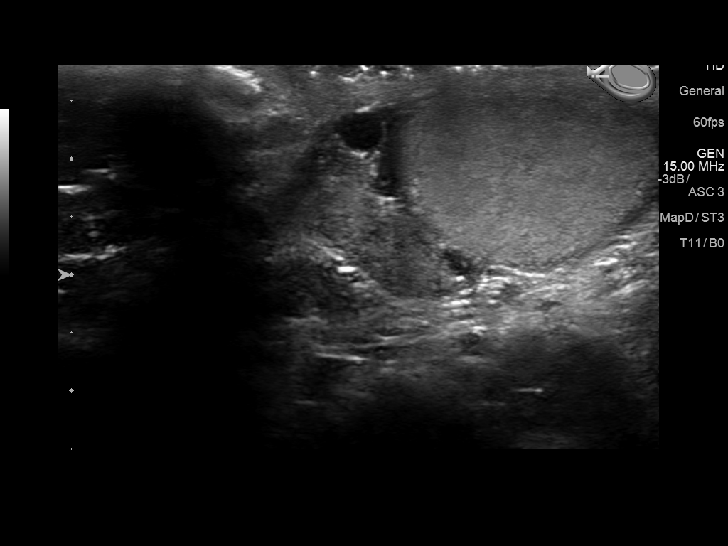
[im 52/69]
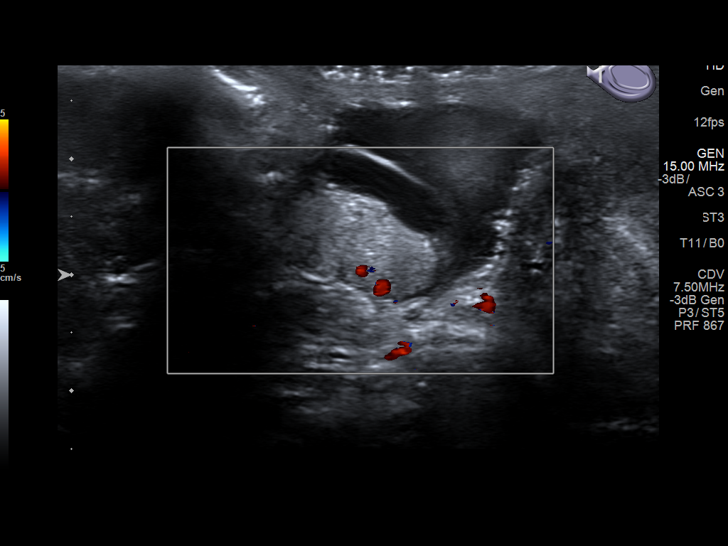
[im 57/69]
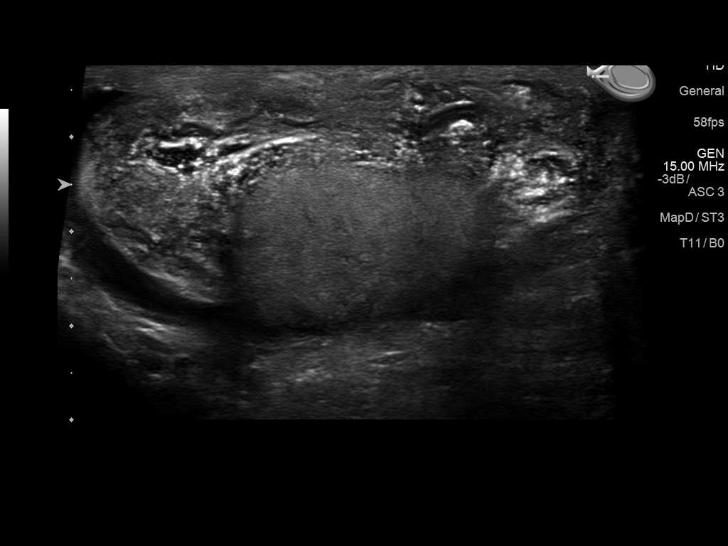
[im 63/69]
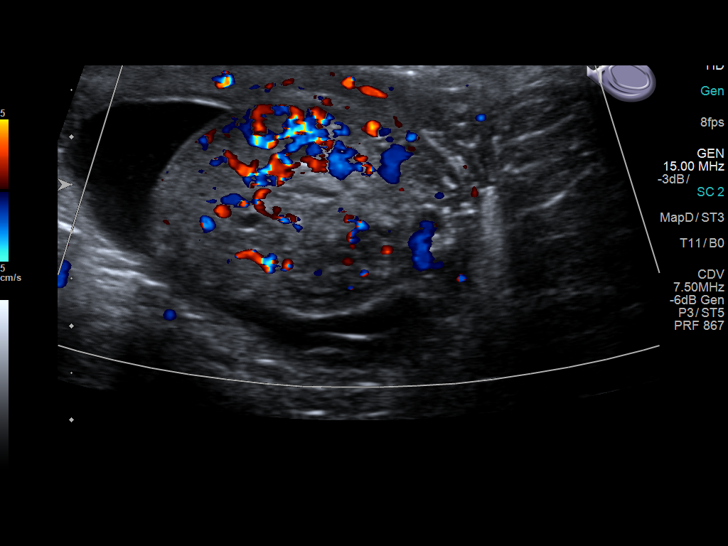
[im 69/69]
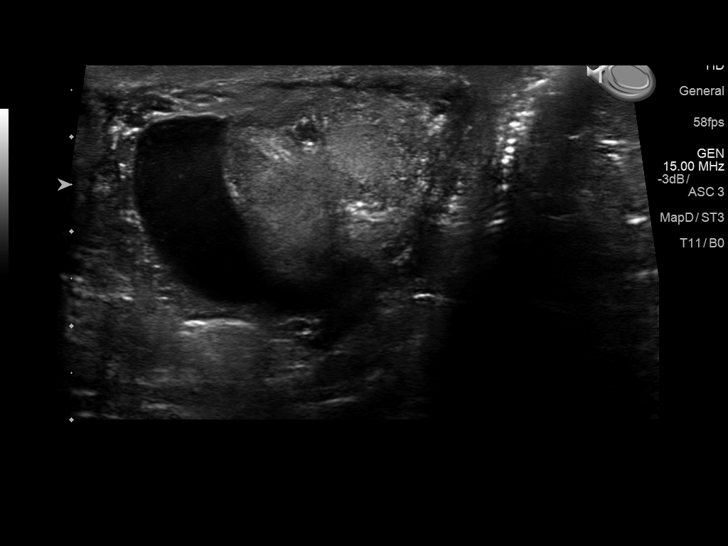

[14 of 25 positions shown; findings below may reference images not displayed]

FINDINGS: Right testicle

Measurements: 4.3 x 2.4 x 2.9 cm. No mass or microlithiasis
visualized.

Left testicle

Measurements: 4.4 x 2.8 x 3.1 cm. There is mild nonspecific
inhomogeneity of the testicular parenchyma without focal mass. Given
slight increase in vascularity of the left testicle relative to the
right, findings may be secondary to an orchitis.

Right epididymis:  1.3 x 0.3 x 0.4 cm cyst of the right epididymis.

Left epididymis: Hyperemic and mildly enlarged and heterogeneous
relative to the right.

Hydrocele:  Small bilateral hydroceles.

Varicocele:  None visualized.
IMPRESSION: 1. Hyperemic and mildly enlarged and heterogeneous appearance of the
left epididymis and testicle consistent with epididymo-orchitis.
2. Small right epididymal cyst measuring 1.3 x 0 3 x 0.4 cm.
3. Small bilateral hydroceles.
4. No testicular torsion.

## 2018-06-27 ENCOUNTER — Telehealth: Payer: Self-pay | Admitting: Urology

## 2018-06-27 ENCOUNTER — Ambulatory Visit (INDEPENDENT_AMBULATORY_CARE_PROVIDER_SITE_OTHER): Payer: Medicare Other

## 2018-06-27 ENCOUNTER — Ambulatory Visit: Payer: Medicare Other | Admitting: Urology

## 2018-06-27 DIAGNOSIS — N390 Urinary tract infection, site not specified: Secondary | ICD-10-CM

## 2018-06-27 DIAGNOSIS — R339 Retention of urine, unspecified: Secondary | ICD-10-CM

## 2018-06-27 DIAGNOSIS — N400 Enlarged prostate without lower urinary tract symptoms: Secondary | ICD-10-CM

## 2018-06-27 NOTE — Telephone Encounter (Signed)
Pt called office this morning stating he was having pelvic pain like he was having before he had his HOLEP, pt was asking for an appt today, I saw an availability on [facility] schedule and thought it would be okay to schedule him there since all other providers were booked/full. I apologize and am sorry. Amy stated she had to call pt to reschedule. Please advise. Thank you.

## 2018-06-27 NOTE — Telephone Encounter (Signed)
Patient reports pelvic pain like he had prior to [facility] & over the last few weeks has been tired with back pain. Denies frequency or urgency but feels he is not emptying completely. He has not self-cathed recently. He went to the chiropractor this morning who suggested he make an appointment with his urologist. Patient was advised to self cath to see if he is in retention. Please advise.

## 2018-06-27 NOTE — Telephone Encounter (Signed)
Patient was unable to pass catheter into the bladder. Advised patient to come to office for UA/ UCx and a PVR today. Patient voices understanding.

## 2018-06-27 NOTE — Progress Notes (Signed)
Simple Catheter Placement  Due to urinary retention patient is present today for a foley cath placement. PVR noted 600cc of urine present.  Patient was cleaned and prepped in a sterile fashion with betadine and lidocaine jelly 2% was instilled into the urethra.  A 18 FR coude catheter was inserted, urine return was noted  670ml, urine was yellow in color.  The balloon was filled with 10cc of sterile water.  A leg bag was attached for drainage. Patient was also given a night bag to take home and was given instruction on how to change from one bag to another.  Patient was given instruction on proper catheter care.  Patient tolerated well, no complications were noted   Preformed by: Cristie Hem, CMA  Additional notes/ Follow up: 1 week for V&T w/ [facility], a sample ws collected for analysis and culture.

## 2018-06-27 NOTE — Telephone Encounter (Signed)
Please bring him in for UA/ UCx and a PVR on nurse schedule this afternoon.    Hollice Espy, MD

## 2018-06-28 LAB — URINALYSIS, COMPLETE
BILIRUBIN UA: NEGATIVE
Glucose, UA: NEGATIVE
Ketones, UA: NEGATIVE
Nitrite, UA: NEGATIVE
Protein, UA: NEGATIVE
RBC, UA: NEGATIVE
Urobilinogen, Ur: 0.2 mg/dL (ref 0.2–1.0)
pH, UA: 6 (ref 5.0–7.5)

## 2018-06-28 LAB — MICROSCOPIC EXAMINATION
EPITHELIAL CELLS (NON RENAL): NONE SEEN /HPF (ref 0–10)
RBC, UA: NONE SEEN /hpf (ref 0–2)

## 2018-07-02 LAB — CULTURE, URINE COMPREHENSIVE

## 2018-07-03 ENCOUNTER — Emergency Department
Admission: EM | Admit: 2018-07-03 | Discharge: 2018-07-03 | Disposition: A | Discharge status: Home / Self Care | Payer: Medicare Other | Attending: Emergency Medicine | Admitting: Emergency Medicine

## 2018-07-03 ENCOUNTER — Other Ambulatory Visit: Payer: Self-pay

## 2018-07-03 ENCOUNTER — Telehealth: Payer: Self-pay

## 2018-07-03 ENCOUNTER — Encounter: Payer: Self-pay | Admitting: Emergency Medicine

## 2018-07-03 ENCOUNTER — Emergency Department: Payer: Medicare Other

## 2018-07-03 DIAGNOSIS — R109 Unspecified abdominal pain: Secondary | ICD-10-CM | POA: Diagnosis present

## 2018-07-03 DIAGNOSIS — F17210 Nicotine dependence, cigarettes, uncomplicated: Secondary | ICD-10-CM | POA: Diagnosis not present

## 2018-07-03 DIAGNOSIS — I7121 Aneurysm of the ascending aorta, without rupture: Secondary | ICD-10-CM

## 2018-07-03 DIAGNOSIS — Z7982 Long term (current) use of aspirin: Secondary | ICD-10-CM | POA: Insufficient documentation

## 2018-07-03 DIAGNOSIS — I10 Essential (primary) hypertension: Secondary | ICD-10-CM | POA: Diagnosis not present

## 2018-07-03 DIAGNOSIS — Z79899 Other long term (current) drug therapy: Secondary | ICD-10-CM | POA: Insufficient documentation

## 2018-07-03 DIAGNOSIS — F0280 Dementia in other diseases classified elsewhere without behavioral disturbance: Secondary | ICD-10-CM | POA: Diagnosis not present

## 2018-07-03 DIAGNOSIS — G301 Alzheimer's disease with late onset: Secondary | ICD-10-CM | POA: Insufficient documentation

## 2018-07-03 DIAGNOSIS — I712 Thoracic aortic aneurysm, without rupture: Secondary | ICD-10-CM | POA: Diagnosis not present

## 2018-07-03 DIAGNOSIS — K5900 Constipation, unspecified: Secondary | ICD-10-CM

## 2018-07-03 DIAGNOSIS — N390 Urinary tract infection, site not specified: Secondary | ICD-10-CM | POA: Diagnosis not present

## 2018-07-03 LAB — URINALYSIS, COMPLETE (UACMP) WITH MICROSCOPIC
BILIRUBIN URINE: NEGATIVE
Glucose, UA: NEGATIVE mg/dL
Hgb urine dipstick: NEGATIVE
Ketones, ur: NEGATIVE mg/dL
NITRITE: POSITIVE — AB
PH: 7 (ref 5.0–8.0)
Protein, ur: NEGATIVE mg/dL
SPECIFIC GRAVITY, URINE: 1.006 (ref 1.005–1.030)

## 2018-07-03 LAB — COMPREHENSIVE METABOLIC PANEL
ALK PHOS: 99 U/L (ref 38–126)
ALT: 22 U/L (ref 0–44)
AST: 32 U/L (ref 15–41)
Albumin: 4.8 g/dL (ref 3.5–5.0)
Anion gap: 5 (ref 5–15)
BUN: 12 mg/dL (ref 8–23)
CHLORIDE: 107 mmol/L (ref 98–111)
CO2: 28 mmol/L (ref 22–32)
CREATININE: 0.85 mg/dL (ref 0.61–1.24)
Calcium: 9 mg/dL (ref 8.9–10.3)
GFR calc Af Amer: >60 mL/min (ref >60)
GFR calc non Af Amer: >60 mL/min (ref >60)
Glucose, Bld: 93 mg/dL (ref 70–99)
Potassium: 3.3 mmol/L — ABNORMAL LOW (ref 3.5–5.1)
Sodium: 140 mmol/L (ref 135–145)
Total Bilirubin: 1.1 mg/dL (ref 0.3–1.2)
Total Protein: 7.3 g/dL (ref 6.5–8.1)

## 2018-07-03 LAB — CBC WITH DIFFERENTIAL/PLATELET
Basophils Absolute: 0.1 10*3/uL (ref 0–0.1)
Basophils Relative: 1 %
EOS PCT: 5 %
Eosinophils Absolute: 0.3 10*3/uL (ref 0–0.7)
HEMATOCRIT: 41 % (ref 40.0–52.0)
Hemoglobin: 14.5 g/dL (ref 13.0–18.0)
LYMPHS ABS: 1.7 10*3/uL (ref 1.0–3.6)
LYMPHS PCT: 31 %
MCH: 32.5 pg (ref 26.0–34.0)
MCHC: 35.4 g/dL (ref 32.0–36.0)
MCV: 91.9 fL (ref 80.0–100.0)
MONO ABS: 0.6 10*3/uL (ref 0.2–1.0)
Monocytes Relative: 12 %
Neutro Abs: 2.7 10*3/uL (ref 1.4–6.5)
Neutrophils Relative %: 51 %
PLATELETS: 176 10*3/uL (ref 150–440)
RBC: 4.46 MIL/uL (ref 4.40–5.90)
RDW: 13.3 % (ref 11.5–14.5)
WBC: 5.4 10*3/uL (ref 3.8–10.6)

## 2018-07-03 LAB — LACTIC ACID, PLASMA: Lactic Acid, Venous: 1.4 mmol/L (ref 0.5–1.9)

## 2018-07-03 MED ORDER — KETOROLAC TROMETHAMINE 30 MG/ML IJ SOLN
15.0000 mg | Freq: Once | INTRAMUSCULAR | Status: AC
  Administered 2018-07-03: 15 mg via INTRAVENOUS
  Filled 2018-07-03: qty 1

## 2018-07-03 MED ORDER — SODIUM CHLORIDE 0.9 % IV SOLN
2.0000 g | Freq: Once | INTRAVENOUS | Status: AC
  Administered 2018-07-03: 2 g via INTRAVENOUS
  Filled 2018-07-03: qty 20

## 2018-07-03 MED ORDER — CEPHALEXIN 500 MG PO CAPS: 500.0000 mg | ORAL_CAPSULE | Freq: Three times a day (TID) | ORAL | 0 refills | Status: DC

## 2018-07-03 MED ORDER — OXYCODONE-ACETAMINOPHEN 5-325 MG PO TABS
1.0000 | ORAL_TABLET | Freq: Once | ORAL | Status: AC
  Administered 2018-07-03: 1 via ORAL
  Filled 2018-07-03: qty 1

## 2018-07-03 MED ORDER — SODIUM CHLORIDE 0.9 % IV BOLUS
1000.0000 mL | Freq: Once | INTRAVENOUS | Status: AC
Start: 2018-07-03 — End: 2018-07-03
  Administered 2018-07-03: 1000 mL via INTRAVENOUS

## 2018-07-03 MED ORDER — LACTULOSE 10 GM/15ML PO SOLN: 20.0000 g | Freq: Every day | ORAL | 0 refills | Status: DC | PRN

## 2018-07-03 NOTE — ED Notes (Signed)
Patient changed from urine leg bag to standard urine drainage bag

## 2018-07-03 NOTE — ED Notes (Signed)
ED Provider at bedside. 

## 2018-07-03 NOTE — Telephone Encounter (Signed)
-----   Message from Hollice Espy, MD sent at 07/02/2018  4:31 PM EDT ----- UCx positive, perhaps this contributed ot recurrent retention.  Lets treat with Bactrim DS bid x 7 days.    Hollice Espy, MD

## 2018-07-03 NOTE — Discharge Instructions (Addendum)
1.  Take antibiotic as prescribed (Keflex 500 mg 3 times daily x7 days). 2.  You may take lactulose as needed for bowel movements. 3.  Blood and urine cultures are pending.  You will be notified of any positive results requiring a change in your antibiotic. 4.  Return to the ER for worsening symptoms, persistent vomiting, fever, difficulty breathing or other concerns.

## 2018-07-03 NOTE — Telephone Encounter (Signed)
Pt states he went to ED last night, he was in a lot of pain, they started him on Keflex yesterday.

## 2018-07-03 NOTE — ED Provider Notes (Signed)
[facility] Emergency Department Provider Note   ____________________________________________   First MD Initiated Contact with Patient 07/03/18 [phone]     (approximate)  I have reviewed the triage vital signs and the nursing notes.   HISTORY  Chief Complaint Back Pain    HPI Marvin Jefferson is a 77 y.o. male who presents to the ED from home with a chief complaint of left flank pain.  Patient has been having ongoing thoracic back pain for the past 3 to 4 weeks.  Had been seeing a chiropractor without relief of symptoms.  Saw his urologist 10 days ago and found to have 600 cc PVR.  Foley catheter was placed and he is scheduled for recheck in 2 days.  Is currently not taking an antibiotic.  Denies fever, chills, chest pain, shortness of breath, abdominal pain, nausea or vomiting.  Denies recent travel or trauma.   Past Medical History:  Diagnosis Date  . Anxiety   . Chronic back pain   . DDD (degenerative disc disease), cervical   . DDD (degenerative disc disease), lumbar   . Dementia   . Depression   . Hypertension   . Meniere's disease   . Pneumonia 2015  . Umbilical hernia   . UTI (urinary tract infection)     Patient Active Problem List   Diagnosis Date Noted  . 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 Benign essential hypertension 09/16/2016  . BPH with obstruction/lower urinary tract symptoms 09/16/2016  . Medicare annual wellness visit, initial 09/16/2016  . Lumbar disc disease 02/28/2014  . Meniere's disease 02/28/2014  . AR (allergic rhinitis) 02/27/2014  . Cervical disc disease 02/27/2014  . HTN (hypertension) 02/27/2014  . Late onset Alzheimer's disease without behavioral disturbance 02/27/2014  . OA (osteoarthritis) 02/27/2014    Past Surgical History:  Procedure Laterality Date  . APPENDECTOMY    . COLONOSCOPY WITH PROPOFOL N/A 05/23/2017   Procedure: COLONOSCOPY WITH PROPOFOL;  Surgeon: Lollie Sails, MD;  Location: [facility] ENDOSCOPY;   Service: Endoscopy;  Laterality: N/A;  . ESOPHAGOGASTRODUODENOSCOPY (EGD) WITH PROPOFOL N/A 05/23/2017   Procedure: ESOPHAGOGASTRODUODENOSCOPY (EGD) WITH PROPOFOL;  Surgeon: Lollie Sails, MD;  Location: [facility] ENDOSCOPY;  Service: Endoscopy;  Laterality: N/A;  . GANGLION CYST EXCISION Right    wrist  . HERNIA REPAIR     umbilical hernia  . 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 N/A 08/23/2016   Procedure: 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;  Surgeon: Hollice Espy, MD;  Location: [facility] ORS;  Service: Urology;  Laterality: N/A;  . 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 N/A 12/06/2016   Procedure: 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;  Surgeon: Hollice Espy, MD;  Location: [facility] ORS;  Service: Urology;  Laterality: N/A;    Prior to Admission medications   Medication Sig Start Date End Date Taking? Authorizing Provider  acetaminophen (TYLENOL) 500 MG tablet Take 1,000 mg by mouth every 6 (six) hours as needed for mild pain.     [provider]  ALPRAZolam Duanne Moron) 0.25 MG tablet Take 0.25 mg by mouth 2 (two) times daily as needed (dizziness).  04/07/16   [provider]  amLODipine (NORVASC) 5 MG tablet Take 5 mg by mouth 2 (two) times daily.  06/23/16   [provider]  aspirin EC 81 MG tablet Take 81 mg by mouth daily.     [provider]  Cholecalciferol (VITAMIN D3) 2000 units capsule Take 2,000 Units by mouth daily.  [provider]  cyanocobalamin (V-R VITAMIN B-12) 500 MCG tablet Take 500 mcg by mouth 2 (two) times daily.     [provider]  docusate sodium (COLACE) 100 MG capsule Take 100 mg by mouth daily.     [provider]  etodolac (LODINE) 400 MG tablet Take 400 mg by mouth daily.     [provider]  FLUoxetine (PROZAC) 20 MG tablet Take 20 mg by mouth daily.    [provider]  fluticasone (FLONASE) 50 MCG/ACT nasal  spray USE 2 SPRAYS IN EACH NOSTRIL EVERY DAY AS NEEDED FOR ALLERGIES 05/04/16   [provider]  gabapentin (NEURONTIN) 100 MG capsule Take 1 capsule by mouth 4 (four) times daily. 04/13/17 04/13/18  [provider]  galantamine (RAZADYNE) 12 MG tablet Take 12 mg by mouth 2 (two) times daily.  06/11/16 11/26/16  [provider]  hydrocortisone cream 1 % Apply 1 application topically daily as needed for itching.    [provider]  meclizine (ANTIVERT) 25 MG tablet Take 25 mg by mouth 3 (three) times daily as needed for dizziness.    [provider]  Melatonin 5 MG CAPS Take 1 capsule by mouth at bedtime.    [provider]  methocarbamol (ROBAXIN) 500 MG tablet Take 500 mg by mouth 3 (three) times daily as needed for muscle spasms.    [provider]  methylphenidate (RITALIN) 5 MG tablet Take 1 tablet by mouth 2 (two) times daily. 06/10/17 07/10/17  [provider]  Multiple Vitamins-Minerals (MULTIVITAMIN WITH MINERALS) tablet Take 1 tablet by mouth daily.    [provider]  neomycin-bacitracin-polymyxin (NEOSPORIN) ointment Apply 1 application topically daily as needed for wound care. apply to eye    [provider]  Omega-3 Fatty Acids (FISH OIL) 1000 MG CAPS Take 1,000 mg by mouth daily.     [provider]  Probiotic Product (PROBIOTIC ADVANCED PO) Take 1 capsule by mouth daily.    [provider]  sodium chloride (OCEAN) 0.65 % SOLN nasal spray Place 2 sprays into both nostrils 2 (two) times daily as needed for congestion.    [provider]  terazosin (HYTRIN) 5 MG capsule Take 5 mg by mouth 2 (two) times daily.  03/08/16   [provider]  traMADol (ULTRAM) 50 MG tablet Take 50 mg by mouth every 6 (six) hours as needed for moderate pain.  03/08/16   [provider]  vitamin E 1000 UNIT capsule Take 1,000 Units by mouth daily.     [provider]     Allergies Clarithromycin  Family History  Problem Relation Age of Onset  . Prostate cancer Father   . Hypertension Father   . Stroke Father   . Hypertension Mother   . Dementia Mother   . Bladder Cancer Neg Hx   . Kidney cancer Neg Hx     Social History Social History   Tobacco Use  . Smoking status: Former Smoker    Last attempt to quit: 08/02/1962    Years since quitting: 55.9  . Smokeless tobacco: Never Used  Substance Use Topics  . Alcohol use: No  . Drug use: No    Review of Systems  Constitutional: No fever/chills Eyes: No visual changes. ENT: No sore throat. Cardiovascular: Denies chest pain. Respiratory: Denies shortness of breath. Gastrointestinal: Positive for left flank pain.  No abdominal pain.  No nausea, no vomiting.  No diarrhea.  No constipation. Genitourinary: Positive for  Foley catheter. Musculoskeletal: Negative for back pain. Skin: Negative for rash. Neurological: Negative for headaches, focal weakness or numbness.   ____________________________________________   PHYSICAL EXAM:  VITAL SIGNS: ED Triage Vitals  Enc Vitals Group     BP 07/03/18 0345 (!) 153/82     Pulse Rate 07/03/18 0345 75     Resp 07/03/18 0345 16     Temp 07/03/18 0345 (!) 97.5 F (36.4 C)     Temp Source 07/03/18 0345 Oral     SpO2 07/03/18 0345 96 %     Weight 07/03/18 0346 200 lb (90.7 kg)     Height 07/03/18 0346 5' 7.5" (1.715 m)     Head Circumference --      Peak Flow --      Pain Score 07/03/18 0346 8     Pain Loc --      Pain Edu? --      Excl. in [place]? --     Constitutional: Alert and oriented. Well appearing and in mild acute distress. Eyes: Conjunctivae are normal. PERRL. EOMI. Head: Atraumatic. Nose: No congestion/rhinnorhea. Mouth/Throat: Mucous membranes are moist.  Oropharynx non-erythematous. Neck: No stridor.   Cardiovascular: Normal rate, regular rhythm. Grossly normal heart sounds.  Good peripheral circulation. Respiratory: Normal  respiratory effort.  No retractions. Lungs CTAB. Gastrointestinal: Soft and nontender to light or deep palpation. No distention. No abdominal bruits.  Mild left CVA tenderness. Genitourinary: Indwelling Foley catheter in place. Musculoskeletal: No lower extremity tenderness nor edema.  No joint effusions. Neurologic:  Normal speech and language. No gross focal neurologic deficits are appreciated. No gait instability. Skin:  Skin is warm, dry and intact. No rash noted.  No petechiae. Psychiatric: Mood and affect are normal. Speech and behavior are normal.  ____________________________________________   LABS (all labs ordered are listed, but only abnormal results are displayed)  Labs Reviewed  URINALYSIS, COMPLETE (UACMP) WITH MICROSCOPIC - Abnormal; Notable for the following components:      Result Value   Color, Urine YELLOW (*)    APPearance HAZY (*)    Nitrite POSITIVE (*)    Leukocytes, UA SMALL (*)    Bacteria, UA RARE (*)    All other components within normal limits  COMPREHENSIVE METABOLIC PANEL - Abnormal; Notable for the following components:   Potassium 3.3 (*)    All other components within normal limits  URINE CULTURE  CULTURE, BLOOD (ROUTINE X 2)  CULTURE, BLOOD (ROUTINE X 2)  LACTIC ACID, PLASMA  CBC WITH DIFFERENTIAL/PLATELET  LACTIC ACID, PLASMA   ____________________________________________  EKG  None ____________________________________________  RADIOLOGY  ED MD interpretation: Cystitis, constipation, aortic aneurysm  Official radiology report(s): Ct Renal Stone Study  Result Date: 07/03/2018 CLINICAL DATA:  LEFT mid back pain for a month. Indwelling catheter placed by urologist 6 days ago. Worsening pain tonight. History of hernia repair, appendectomy, urinary tract infection. EXAM: CT ABDOMEN AND PELVIS WITHOUT CONTRAST TECHNIQUE: Multidetector CT imaging of the abdomen and pelvis was performed following the standard protocol without IV contrast.  COMPARISON:  CT abdomen and pelvis Mar 09, 2016 FINDINGS: LOWER CHEST: RIGHT lower lobe dependent atelectasis. The visualized heart size is normal. 4.4 cm ascending aorta. Hard mild pericardial thickening. Mild coronary artery calcification. HEPATOBILIARY: Subcentimeter probable cyst in the liver. Normal gallbladder. PANCREAS: Nonacute.  Punctate calcification. SPLEEN: Normal. ADRENALS/URINARY TRACT: Kidneys are orthotopic, mildly atrophic LEFT kidney. Mild RIGHT pelviectasis. No nephrolithiasis, hydronephrosis; limited assessment for renal masses by nonenhanced CT. Urinary bladder decompressed by Foley catheter with  disproportion of bladder wall thickening and intravesicular gas. 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 Unremarkable RIGHT adrenal gland. STOMACH/BOWEL: The stomach, small and large bowel are normal in course and caliber without inflammatory changes, sensitivity decreased by lack of enteric contrast. Moderate amount of retained large bowel stool. VASCULAR/LYMPHATIC: Aortoiliac vessels are normal in course and caliber. Mild calcific atherosclerosis. No lymphadenopathy by CT size criteria. REPRODUCTIVE: Status post prostatectomy. OTHER: No intraperitoneal free fluid or free air. Small fat containing umbilical hernia. MUSCULOSKELETAL: Non-acute. Mild scoliosis. 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 IMPRESSION: 1. Bladder wall thickening compatible with cystitis, decompressed by Foley catheter. Mild bilateral pelviectasis without frank hydronephrosis. No nephrolithiasis. 2. Moderate amount of retained large bowel stool without bowel obstruction. 3. **An incidental finding of potential clinical significance has been found. 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 Recommend annual imaging followup by CTA or MRA. This recommendation follows 2010 ACCF/AHA/AATS/ACR/ASA/SCA/SCAI/SIR/STS/SVM Guidelines for the Diagnosis and Management of Patients  with Thoracic Aortic Disease. Circulation. 2010; 121: W109-N235** Aortic Atherosclerosis (ICD10-I70.0). Electronically Signed   By: Elon Alas M.D.   On: 07/03/2018 04:55    ____________________________________________   PROCEDURES  Procedure(s) performed: None  Procedures  Critical Care performed: No  ____________________________________________   INITIAL IMPRESSION / ASSESSMENT AND PLAN / ED COURSE  As part of my medical decision making, I reviewed the following data within the electronic MEDICAL RECORD NUMBER History obtained from family, Nursing notes reviewed and incorporated, Labs reviewed, Old chart reviewed and Notes from prior ED visits   77 year old male who presents with left flank pain in the setting of recent Foley catheter placement for acute urinary retention. Differential diagnosis includes, but is not limited to, acute appendicitis, renal colic, testicular torsion, urinary tract infection/pyelonephritis, prostatitis,  epididymitis, diverticulitis, small bowel obstruction or ileus, colitis, abdominal aortic aneurysm, gastroenteritis, hernia, etc.  I personally reviewed patient's urine culture from urine collected 9/10 which demonstrates Klebsiella which is sensitive to ceftriaxone.  Will initiate IV fluid resuscitation, 15 mg IV Toradol for pain paired with oral Percocet.  Administer 2 g IV Rocephin for UTI.  Will reassess.  Clinical Course as of Jul 03 617  Mon Jul 03, 2018  5732 Updated patient and family member of CT results.  Will discharge home on lactulose and Keflex.  Did discuss with patient with aortic aneurysm which will require follow-up with his PCP.  Strict return precautions given.  Patient and family member verbalize understanding and agree with plan of care.   [JS]    Clinical Course User Index [JS] Paulette Blanch, MD     ____________________________________________   FINAL CLINICAL IMPRESSION(S) / ED DIAGNOSES  Final diagnoses:  Lower  urinary tract infectious disease  Constipation, unspecified constipation type  Aortic aneurysm without rupture, unspecified portion of aorta [facility])     ED Discharge Orders    None       Note:  This document was prepared using Dragon voice recognition software and may include unintentional dictation errors.    Paulette Blanch, MD 07/03/18 [phone]

## 2018-07-03 NOTE — ED Notes (Signed)
Patient c/o left flank pain and chills. Patient has indwelling urinary catheter. Patient reports positive urine culture at PCP.

## 2018-07-03 NOTE — ED Notes (Signed)
Patient transported to CT 

## 2018-07-03 NOTE — ED Notes (Signed)
Reviewed discharge instructions, follow-up care, and prescriptions with patient. Patient verbalized understanding of all information reviewed. Patient stable, with no distress noted at this time.    Patient's urine drainage bag changed to a leg bag.

## 2018-07-03 NOTE — ED Triage Notes (Signed)
Pt presents with left mid back pain that started 3-4 weeks ago, intermittent; pt originally thought pain was muscular and had been seeing a chiropractor; pain did not resolve so pt was seen by his urologist this past Tuesday-an indwelling catheter was placed-618ml drained from bladder; pt says he was able to look on his [facility] chart Sunday for his UA results and saw he has a urinary tract infection; pt says pain is 10 times worse tonight; urine output has increased; urine in bag noted to be clear at this time;

## 2018-07-04 ENCOUNTER — Ambulatory Visit: Payer: Medicare Other

## 2018-07-04 DIAGNOSIS — R339 Retention of urine, unspecified: Secondary | ICD-10-CM

## 2018-07-06 LAB — URINE CULTURE

## 2018-07-08 LAB — CULTURE, BLOOD (ROUTINE X 2)
CULTURE: NO GROWTH
CULTURE: NO GROWTH

## 2018-07-11 ENCOUNTER — Ambulatory Visit: Payer: Medicare Other | Admitting: Urology

## 2018-07-11 ENCOUNTER — Encounter: Payer: Self-pay | Admitting: Urology

## 2018-07-11 VITALS — BP 111/67 | HR 67 | Ht 68.0 in | Wt 200.0 lb

## 2018-07-11 DIAGNOSIS — N401 Enlarged prostate with lower urinary tract symptoms: Secondary | ICD-10-CM | POA: Diagnosis not present

## 2018-07-11 DIAGNOSIS — N3000 Acute cystitis without hematuria: Secondary | ICD-10-CM

## 2018-07-11 DIAGNOSIS — N138 Other obstructive and reflux uropathy: Secondary | ICD-10-CM

## 2018-07-11 DIAGNOSIS — R339 Retention of urine, unspecified: Secondary | ICD-10-CM | POA: Diagnosis not present

## 2018-07-11 NOTE — Progress Notes (Signed)
07/11/2018 12:50 PM   Marvin Jefferson [birth year] [id number]  Referring provider: Rusty Aus, MD [facility] [place], [place] [id number]  Chief Complaint  Patient presents with  . 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  He was ultimately diagnosed with a Klebsiella urinary tract infection and placed on Keflex.    He did have some associated thoracic pain at the time of ER visit, was felt to be possibly related to pyelonephritis.  After 3 days of antibiotic, his symptoms have completely resolved and is feeling back to baseline.  He reports that several months prior to presenting in urinary retention, he was having pelvic pain, low back pain amongst others.  He was seeing a chiropractor as he thought that was likely multiple musculoskeletal.  Now that his retention is resolved and his infection has been treated, his symptoms have also completely resolved.  In retrospect, he thinks that he is had low-grade infection/retention for a few months.  Return to our office last week for a voiding trial.  After the catheter was removed, he was able to begin self-catheterization again.  He comes in today with a voiding diary.  He is self cath daily since that time.  He voided volumes ranging from 100 cc to 425 cc.  Postvoid residuals ranged from 0 cc to 225 cc but consistently voided greater than 50% of his bladder volume.     PMH: Past Medical History:  Diagnosis Date  . Anxiety   . Chronic back pain   . DDD (degenerative disc disease), cervical   . DDD (degenerative disc disease), lumbar   . Dementia   . Depression   . Hypertension   . Meniere's disease   . Pneumonia 2015  . Umbilical hernia   . UTI (urinary tract infection)     Surgical History: Past Surgical History:  Procedure Laterality Date  .  APPENDECTOMY    . COLONOSCOPY WITH PROPOFOL N/A 05/23/2017   Procedure: COLONOSCOPY WITH PROPOFOL;  Surgeon: Lollie Sails, MD;  Location: [facility] ENDOSCOPY;  Service: Endoscopy;  Laterality: N/A;  . ESOPHAGOGASTRODUODENOSCOPY (EGD) WITH PROPOFOL N/A 05/23/2017   Procedure: ESOPHAGOGASTRODUODENOSCOPY (EGD) WITH PROPOFOL;  Surgeon: Lollie Sails, MD;  Location: [facility] ENDOSCOPY;  Service: Endoscopy;  Laterality: N/A;  . GANGLION CYST EXCISION Right    wrist  . HERNIA REPAIR     umbilical hernia  . 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 N/A 08/23/2016   Procedure: 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;  Surgeon: Hollice Espy, MD;  Location: [facility] ORS;  Service: Urology;  Laterality: N/A;  . 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 N/A 12/06/2016   Procedure: 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;  Surgeon: Hollice Espy, MD;  Location: [facility] ORS;  Service: Urology;  Laterality: N/A;    Home Medications:  Allergies as of 07/11/2018      Reactions   Clarithromycin Other (See Comments)   Terrible taste and smell      Medication List        Accurate as of 07/11/18 12:50 PM. Always use your most recent med list.          acetaminophen 500 MG tablet Commonly known as:  TYLENOL Take 1,000 mg by mouth every 6 (six) hours as needed for mild pain.   ALPRAZolam 0.25 MG tablet Commonly  known as:  XANAX Take 0.25 mg by mouth 2 (two) times daily as needed (dizziness).   amLODipine 5 MG tablet Commonly known as:  NORVASC Take 5 mg by mouth 2 (two) times daily.   aspirin EC 81 MG tablet Take 81 mg by mouth daily.   cephALEXin 500 MG capsule Commonly known as:  KEFLEX Take 1 capsule (500 mg total) by mouth 3 (three) times daily.   docusate sodium 100 MG capsule Commonly known as:  COLACE Take 100 mg by mouth daily.   etodolac 400 MG tablet Commonly known as:  LODINE Take 400 mg by mouth daily.   Fish Oil  1000 MG Caps Take 1,000 mg by mouth daily.   FLUoxetine 20 MG tablet Commonly known as:  PROZAC Take 20 mg by mouth daily.   fluticasone 50 MCG/ACT nasal spray Commonly known as:  FLONASE USE 2 SPRAYS IN EACH NOSTRIL EVERY DAY AS NEEDED FOR ALLERGIES   gabapentin 100 MG capsule Commonly known as:  NEURONTIN Take 1 capsule by mouth 4 (four) times daily.   galantamine 12 MG tablet Commonly known as:  RAZADYNE Take 12 mg by mouth 2 (two) times daily.   hydrocortisone cream 1 % Apply 1 application topically daily as needed for itching.   lactulose 10 GM/15ML solution Commonly known as:  CHRONULAC Take 30 mLs (20 g total) by mouth daily as needed for mild constipation.   meclizine 25 MG tablet Commonly known as:  ANTIVERT Take 25 mg by mouth 3 (three) times daily as needed for dizziness.   Melatonin 5 MG Caps Take 1 capsule by mouth at bedtime.   methocarbamol 500 MG tablet Commonly known as:  ROBAXIN Take 500 mg by mouth 3 (three) times daily as needed for muscle spasms.   methylphenidate 5 MG tablet Commonly known as:  RITALIN Take 1 tablet by mouth 2 (two) times daily.   multivitamin with minerals tablet Take 1 tablet by mouth daily.   neomycin-bacitracin-polymyxin ointment Commonly known as:  NEOSPORIN Apply 1 application topically daily as needed for wound care. apply to eye   PROBIOTIC ADVANCED PO Take 1 capsule by mouth daily.   sodium chloride 0.65 % Soln nasal spray Commonly known as:  OCEAN Place 2 sprays into both nostrils 2 (two) times daily as needed for congestion.   terazosin 5 MG capsule Commonly known as:  HYTRIN Take 5 mg by mouth 2 (two) times daily.   traMADol 50 MG tablet Commonly known as:  ULTRAM Take 50 mg by mouth every 6 (six) hours as needed for moderate pain.   V-R VITAMIN B-12 500 MCG tablet Generic drug:  vitamin B-12 Take 500 mcg by mouth 2 (two) times daily.   Vitamin D3 2000 units capsule Take 2,000 Units by mouth  daily.   vitamin E 1000 UNIT capsule Take 1,000 Units by mouth daily.       Allergies:  Allergies  Allergen Reactions  . Clarithromycin Other (See Comments)    Terrible taste and smell    Family History: Family History  Problem Relation Age of Onset  . Prostate cancer Father   . Hypertension Father   . Stroke Father   . Hypertension Mother   . Dementia Mother   . Bladder Cancer Neg Hx   . Kidney cancer Neg Hx     Social History:  reports that he quit smoking about 55 years ago. He has never used smokeless tobacco. He reports that he does not drink alcohol or use  drugs.  ROS: UROLOGY Frequent Urination?: No Hard to postpone urination?: No Burning/pain with urination?: No Get up at night to urinate?: Yes Leakage of urine?: No Urine stream starts and stops?: No Trouble starting stream?: No Do you have to strain to urinate?: No Blood in urine?: No Urinary tract infection?: No Sexually transmitted disease?: No Injury to kidneys or bladder?: No Painful intercourse?: No Weak stream?: No Erection problems?: No Penile pain?: No  Gastrointestinal Nausea?: No Vomiting?: No Indigestion/heartburn?: No Diarrhea?: No Constipation?: Yes  Constitutional Fever: No Night sweats?: Yes Weight loss?: No Fatigue?: No  Skin Skin rash/lesions?: No Itching?: No  Eyes Blurred vision?: No Double vision?: No  Ears/Nose/Throat Sore throat?: No Sinus problems?: No  Hematologic/Lymphatic Swollen glands?: No Easy bruising?: No  Cardiovascular Leg swelling?: No Chest pain?: Yes  Respiratory Cough?: No Shortness of breath?: No  Endocrine Excessive thirst?: No  Musculoskeletal Back pain?: Yes Joint pain?: Yes  Neurological Headaches?: No Dizziness?: Yes  Psychologic Depression?: Yes Anxiety?: No  Physical Exam: BP 111/67   Pulse 67   Ht 5\' 8"  (1.727 m)   Wt 200 lb (90.7 kg)   BMI 30.41 kg/m   Constitutional:  Alert and oriented, No acute  distress. HEENT: [place] AT, moist mucus membranes.  Trachea midline, no masses. Cardiovascular: No clubbing, cyanosis, or edema. Respiratory: Normal respiratory effort, no increased work of breathing. Skin: No rashes, bruises or suspicious lesions. Neurologic: Grossly intact, no focal deficits, moving all 4 extremities. Psychiatric: Normal mood and affect.  Laboratory Data: Lab Results  Component Value Date   WBC 5.4 07/03/2018   HGB 14.5 07/03/2018   HCT 41.0 07/03/2018   MCV 91.9 07/03/2018   PLT 176 07/03/2018    Lab Results  Component Value Date   CREATININE 0.85 07/03/2018    Urinalysis N/a  Pertinent Imaging: Results for orders placed during the hospital encounter of 07/03/18  CT Renal Stone Study   Narrative CLINICAL DATA:  LEFT mid back pain for a month. Indwelling catheter placed by urologist 6 days ago. Worsening pain tonight. History of hernia repair, appendectomy, urinary tract infection.  EXAM: CT ABDOMEN AND PELVIS WITHOUT CONTRAST  TECHNIQUE: Multidetector CT imaging of the abdomen and pelvis was performed following the standard protocol without IV contrast.  COMPARISON:  CT abdomen and pelvis Mar 09, 2016  FINDINGS: LOWER CHEST: RIGHT lower lobe dependent atelectasis. The visualized heart size is normal. 4.4 cm ascending aorta. Hard mild pericardial thickening. Mild coronary artery calcification.  HEPATOBILIARY: Subcentimeter probable cyst in the liver. Normal gallbladder.  PANCREAS: Nonacute.  Punctate calcification.  SPLEEN: Normal.  ADRENALS/URINARY TRACT: Kidneys are orthotopic, mildly atrophic LEFT kidney. Mild RIGHT pelviectasis. No nephrolithiasis, hydronephrosis; limited assessment for renal masses by nonenhanced CT. Urinary bladder decompressed by Foley catheter with disproportion of bladder wall thickening and intravesicular gas. 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 Unremarkable RIGHT adrenal  gland.  STOMACH/BOWEL: The stomach, small and large bowel are normal in course and caliber without inflammatory changes, sensitivity decreased by lack of enteric contrast. Moderate amount of retained large bowel stool.  VASCULAR/LYMPHATIC: Aortoiliac vessels are normal in course and caliber. Mild calcific atherosclerosis. No lymphadenopathy by CT size criteria.  REPRODUCTIVE: Status post prostatectomy.  OTHER: No intraperitoneal free fluid or free air. Small fat containing umbilical hernia.  MUSCULOSKELETAL: Non-acute. Mild scoliosis. 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  IMPRESSION: 1. Bladder wall thickening compatible with cystitis, decompressed by Foley catheter. Mild bilateral pelviectasis without frank  hydronephrosis. No nephrolithiasis. 2. Moderate amount of retained large bowel stool without bowel obstruction. 3. **An incidental finding of potential clinical significance has been found. 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 Recommend annual imaging followup by CTA or MRA. This recommendation follows 2010 ACCF/AHA/AATS/ACR/ASA/SCA/SCAI/SIR/STS/SVM Guidelines for the Diagnosis and Management of Patients with Thoracic Aortic Disease. Circulation. 2010; 121: [id number]**  Aortic Atherosclerosis (ICD10-I70.0).   Electronically Signed   By: Elon Alas M.D.   On: 07/03/2018 04:55     Assessment & Plan:    1. Benign prostatic hyperplasia with urinary obstruction Continue Hytrin Urinary symptoms now stabilized, follow-up in March as scheduled  2. Urinary retention Resolved, cathing for a relatively low volume residuals Cath as needed if he feels like he is not emptying his bladder Likely exacerbated by UTI, now adequately treated  3. Acute cystitis without hematuria Adequately treated Klebsiella infection Currently asymptomatic  F/u in March as scheduled  Hollice Espy, MD  [place] [address] [place], [place] [address] [phone]

## 2018-07-11 NOTE — Progress Notes (Signed)
Catheter Removal  Patient is present today for a catheter removal.  15ml of water was drained from the balloon. A 16FR foley cath was removed from the bladder no complications were noted . Patient tolerated well.  Preformed by: Cristie Hem, CMA  Follow up/ Additional notes: Pt was given catheters for self catheterizing. He states he has ben catheterizing himself already and did not need instructing. I gave pt a brief overview of process just to make sure.

## 2018-07-19 ENCOUNTER — Ambulatory Visit (INDEPENDENT_AMBULATORY_CARE_PROVIDER_SITE_OTHER): Payer: Medicare Other | Admitting: Family Medicine

## 2018-07-19 ENCOUNTER — Encounter: Payer: Self-pay | Admitting: Family Medicine

## 2018-07-19 VITALS — BP 134/82 | HR 76 | Ht 68.0 in | Wt 198.8 lb

## 2018-07-19 DIAGNOSIS — N390 Urinary tract infection, site not specified: Secondary | ICD-10-CM | POA: Diagnosis not present

## 2018-07-19 DIAGNOSIS — B379 Candidiasis, unspecified: Secondary | ICD-10-CM

## 2018-07-19 LAB — URINALYSIS, COMPLETE
Bilirubin, UA: NEGATIVE
Glucose, UA: NEGATIVE
Ketones, UA: NEGATIVE
LEUKOCYTES UA: NEGATIVE
Nitrite, UA: NEGATIVE
Protein, UA: NEGATIVE
RBC, UA: NEGATIVE
Specific Gravity, UA: 1.015 (ref 1.005–1.030)
Urobilinogen, Ur: 0.2 mg/dL (ref 0.2–1.0)
pH, UA: 7 (ref 5.0–7.5)

## 2018-07-19 LAB — MICROSCOPIC EXAMINATION
Bacteria, UA: NONE SEEN
EPITHELIAL CELLS (NON RENAL): NONE SEEN /HPF (ref 0–10)
WBC, UA: NONE SEEN /hpf (ref 0–5)

## 2018-07-19 MED ORDER — NYSTATIN 100000 UNIT/GM EX CREA: 1.0000 "application " | TOPICAL_CREAM | Freq: Two times a day (BID) | CUTANEOUS | 0 refills | Status: DC

## 2018-07-19 MED ORDER — NYSTATIN-TRIAMCINOLONE 100000-0.1 UNIT/GM-% EX CREA: TOPICAL_CREAM | Freq: Two times a day (BID) | CUTANEOUS | Status: DC

## 2018-07-19 NOTE — Progress Notes (Signed)
Patient presents today with flank pain, lower abdominal pain. He was on Keflex 2 weeks ago for UTI per ED. He is npt allergic to ABX and has not had any Urological surgeries in the last month. He complain of his foreskin being swollen and red because of a yeast infection from Keflex. A urine was collected for UA, UCX.

## 2018-07-22 LAB — CULTURE, URINE COMPREHENSIVE

## 2019-01-09 ENCOUNTER — Ambulatory Visit: Payer: Medicare Other | Admitting: Urology

## 2019-02-12 ENCOUNTER — Other Ambulatory Visit: Payer: Self-pay

## 2019-02-12 ENCOUNTER — Telehealth (INDEPENDENT_AMBULATORY_CARE_PROVIDER_SITE_OTHER): Payer: Medicare Other | Admitting: Urology

## 2019-02-12 DIAGNOSIS — R339 Retention of urine, unspecified: Secondary | ICD-10-CM

## 2019-02-12 DIAGNOSIS — N138 Other obstructive and reflux uropathy: Secondary | ICD-10-CM | POA: Diagnosis not present

## 2019-02-12 DIAGNOSIS — N401 Enlarged prostate with lower urinary tract symptoms: Secondary | ICD-10-CM | POA: Diagnosis not present

## 2019-02-12 NOTE — Progress Notes (Signed)
Virtual Visit via Video Note  I connected with Marvin Jefferson on 02/12/19 at 10:00 AM EDT by a video enabled telemedicine application and verified that I am speaking with the correct person using two identifiers.   I discussed the limitations of evaluation and management by telemedicine and the availability of in person appointments. The patient expressed understanding and agreed to proceed.  History of Present Illness: 78 year old male with a history of massive BPH status post holep 08/2016, history of incomplete bladder emptying he returns today via virtual visit for 53-month follow-up.  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  He did have an issue 6 months ago with Klebsiella urinary tract infection.  He was treated and all of his symptoms resolved.  Following surgery, he was able to empty with increasing ability, PVRs ranging from 100-250 cc.  He was previously checking post void residuals weekly which were full and he subsequently was stopped doing this.  Today, he has no urinary complaints.  He no longer gets up at night to void.  No recent infections.  No recent pelvic pain.  Excellent flow.  He has no daytime symptoms.  He is on terazosin prescribed for blood pressure issues rather than for voiding.  Most recent labs 02/2018 by primary care physician, normal creatinine, PSA approximately 1.    Observations/Objective Pleasant, sitting in recliner  Assessment and Plan:  1. BPH with obstruction/lower urinary tract symptoms Continues to do well, asymptomatic following holep No further UTIs Adequate emptying I advised him to check a PVR once this week to ensure that he in fact continues to empty well which she will do, let us know if it is greater than 100 to 200 cc At this point time, is been stable for quite some time, I have advised follow-up as needed if he  develops any worsening urinary symptoms, recurrent infection, or any other concerns Labs to be checked next month by PCP, no further indication for PSA testing given his age and PSA stability  2. Incomplete bladder emptying As above   Follow Up Instructions: prn   I discussed the assessment and treatment plan with the patient. The patient was provided an opportunity to ask questions and all were answered. The patient agreed with the plan and demonstrated an understanding of the instructions.   The patient was advised to call back or seek an in-person evaluation if the symptoms worsen or if the condition fails to improve as anticipated.  I provided 14 minutes of non-face-to-face time during this encounter.   Hollice Espy, MD

## 2019-02-14 ENCOUNTER — Telehealth: Payer: Medicare Other | Admitting: Urology

## 2019-04-19 ENCOUNTER — Inpatient Hospital Stay
Admit: 2019-04-19 | Discharge: 2019-04-19 | Disposition: A | Discharge status: Home / Self Care | Payer: Medicare Other | Attending: Cardiology | Admitting: Cardiology

## 2019-04-19 ENCOUNTER — Encounter
Admission: EM | Disposition: A | Discharge status: Home / Self Care | Payer: Self-pay | Source: Home / Self Care | Attending: Internal Medicine

## 2019-04-19 ENCOUNTER — Other Ambulatory Visit: Payer: Self-pay

## 2019-04-19 ENCOUNTER — Inpatient Hospital Stay
Admission: EM | Admit: 2019-04-19 | Discharge: 2019-04-20 | DRG: 247 | Disposition: A | Discharge status: Home / Self Care | Payer: Medicare Other | Attending: Internal Medicine | Admitting: Internal Medicine

## 2019-04-19 DIAGNOSIS — M5136 Other intervertebral disc degeneration, lumbar region: Secondary | ICD-10-CM | POA: Diagnosis present

## 2019-04-19 DIAGNOSIS — F418 Other specified anxiety disorders: Secondary | ICD-10-CM | POA: Diagnosis present

## 2019-04-19 DIAGNOSIS — G629 Polyneuropathy, unspecified: Secondary | ICD-10-CM | POA: Diagnosis present

## 2019-04-19 DIAGNOSIS — F329 Major depressive disorder, single episode, unspecified: Secondary | ICD-10-CM | POA: Diagnosis present

## 2019-04-19 DIAGNOSIS — I2510 Atherosclerotic heart disease of native coronary artery without angina pectoris: Secondary | ICD-10-CM | POA: Diagnosis present

## 2019-04-19 DIAGNOSIS — Z79891 Long term (current) use of opiate analgesic: Secondary | ICD-10-CM

## 2019-04-19 DIAGNOSIS — Z8042 Family history of malignant neoplasm of prostate: Secondary | ICD-10-CM | POA: Diagnosis not present

## 2019-04-19 DIAGNOSIS — Z8249 Family history of ischemic heart disease and other diseases of the circulatory system: Secondary | ICD-10-CM

## 2019-04-19 DIAGNOSIS — M5030 Other cervical disc degeneration, unspecified cervical region: Secondary | ICD-10-CM | POA: Diagnosis present

## 2019-04-19 DIAGNOSIS — Z20828 Contact with and (suspected) exposure to other viral communicable diseases: Secondary | ICD-10-CM | POA: Diagnosis present

## 2019-04-19 DIAGNOSIS — R079 Chest pain, unspecified: Secondary | ICD-10-CM | POA: Diagnosis present

## 2019-04-19 DIAGNOSIS — G8929 Other chronic pain: Secondary | ICD-10-CM | POA: Diagnosis present

## 2019-04-19 DIAGNOSIS — I959 Hypotension, unspecified: Secondary | ICD-10-CM | POA: Diagnosis present

## 2019-04-19 DIAGNOSIS — Z881 Allergy status to other antibiotic agents status: Secondary | ICD-10-CM

## 2019-04-19 DIAGNOSIS — E876 Hypokalemia: Secondary | ICD-10-CM | POA: Diagnosis present

## 2019-04-19 DIAGNOSIS — I2119 ST elevation (STEMI) myocardial infarction involving other coronary artery of inferior wall: Principal | ICD-10-CM | POA: Diagnosis present

## 2019-04-19 DIAGNOSIS — F419 Anxiety disorder, unspecified: Secondary | ICD-10-CM | POA: Diagnosis present

## 2019-04-19 DIAGNOSIS — I10 Essential (primary) hypertension: Secondary | ICD-10-CM | POA: Diagnosis present

## 2019-04-19 DIAGNOSIS — F0390 Unspecified dementia without behavioral disturbance: Secondary | ICD-10-CM | POA: Diagnosis present

## 2019-04-19 DIAGNOSIS — Z7951 Long term (current) use of inhaled steroids: Secondary | ICD-10-CM

## 2019-04-19 DIAGNOSIS — Z87891 Personal history of nicotine dependence: Secondary | ICD-10-CM

## 2019-04-19 DIAGNOSIS — Z7982 Long term (current) use of aspirin: Secondary | ICD-10-CM | POA: Diagnosis not present

## 2019-04-19 DIAGNOSIS — Z79899 Other long term (current) drug therapy: Secondary | ICD-10-CM | POA: Diagnosis not present

## 2019-04-19 DIAGNOSIS — I213 ST elevation (STEMI) myocardial infarction of unspecified site: Secondary | ICD-10-CM

## 2019-04-19 DIAGNOSIS — Z823 Family history of stroke: Secondary | ICD-10-CM | POA: Diagnosis not present

## 2019-04-19 DIAGNOSIS — R0781 Pleurodynia: Secondary | ICD-10-CM

## 2019-04-19 HISTORY — PX: LEFT HEART CATH AND CORONARY ANGIOGRAPHY: CATH118249

## 2019-04-19 HISTORY — PX: CORONARY/GRAFT ACUTE MI REVASCULARIZATION: CATH118305

## 2019-04-19 LAB — LIPID PANEL
Cholesterol: 162 mg/dL (ref 0–200)
HDL: 44 mg/dL
LDL Cholesterol: 104 mg/dL — ABNORMAL HIGH (ref 0–99)
Total CHOL/HDL Ratio: 3.7 ratio
Triglycerides: 70 mg/dL
VLDL: 14 mg/dL (ref 0–40)

## 2019-04-19 LAB — COMPREHENSIVE METABOLIC PANEL WITH GFR
ALT: 19 U/L (ref 0–44)
AST: 28 U/L (ref 15–41)
Albumin: 4.1 g/dL (ref 3.5–5.0)
Alkaline Phosphatase: 91 U/L (ref 38–126)
Anion gap: 8 (ref 5–15)
BUN: 22 mg/dL (ref 8–23)
CO2: 24 mmol/L (ref 22–32)
Calcium: 8.8 mg/dL — ABNORMAL LOW (ref 8.9–10.3)
Chloride: 107 mmol/L (ref 98–111)
Creatinine, Ser: 1.01 mg/dL (ref 0.61–1.24)
GFR calc Af Amer: >60 mL/min
GFR calc non Af Amer: >60 mL/min
Glucose, Bld: 155 mg/dL — ABNORMAL HIGH (ref 70–99)
Potassium: 3 mmol/L — ABNORMAL LOW (ref 3.5–5.1)
Sodium: 139 mmol/L (ref 135–145)
Total Bilirubin: 1 mg/dL (ref 0.3–1.2)
Total Protein: 7 g/dL (ref 6.5–8.1)

## 2019-04-19 LAB — CBC
HCT: 39.8 % (ref 39.0–52.0)
Hemoglobin: 13.6 g/dL (ref 13.0–17.0)
MCH: 31.5 pg (ref 26.0–34.0)
MCHC: 34.2 g/dL (ref 30.0–36.0)
MCV: 92.1 fL (ref 80.0–100.0)
Platelets: 155 10*3/uL (ref 150–400)
RBC: 4.32 MIL/uL (ref 4.22–5.81)
RDW: 12.1 % (ref 11.5–15.5)
WBC: 11.3 10*3/uL — ABNORMAL HIGH (ref 4.0–10.5)
nRBC: 0 % (ref 0.0–0.2)

## 2019-04-19 LAB — TROPONIN I (HIGH SENSITIVITY)
Troponin I (High Sensitivity): 29 ng/L — ABNORMAL HIGH
Troponin I (High Sensitivity): 4346 ng/L
Troponin I (High Sensitivity): 17236 ng/L (ref <18)
Troponin I (High Sensitivity): >27000 ng/L (ref <18)

## 2019-04-19 LAB — CBC WITH DIFFERENTIAL/PLATELET
Abs Immature Granulocytes: 0.02 10*3/uL (ref 0.00–0.07)
Basophils Absolute: 0.1 10*3/uL (ref 0.0–0.1)
Basophils Relative: 1 %
Eosinophils Absolute: 0.2 10*3/uL (ref 0.0–0.5)
Eosinophils Relative: 3 %
HCT: 42.5 % (ref 39.0–52.0)
Hemoglobin: 14.8 g/dL (ref 13.0–17.0)
Immature Granulocytes: 0 %
Lymphocytes Relative: 34 %
Lymphs Abs: 2 10*3/uL (ref 0.7–4.0)
MCH: 31.6 pg (ref 26.0–34.0)
MCHC: 34.8 g/dL (ref 30.0–36.0)
MCV: 90.6 fL (ref 80.0–100.0)
Monocytes Absolute: 0.6 10*3/uL (ref 0.1–1.0)
Monocytes Relative: 9 %
Neutro Abs: 3.1 10*3/uL (ref 1.7–7.7)
Neutrophils Relative %: 53 %
Platelets: 172 10*3/uL (ref 150–400)
RBC: 4.69 MIL/uL (ref 4.22–5.81)
RDW: 12.1 % (ref 11.5–15.5)
WBC: 5.9 10*3/uL (ref 4.0–10.5)
nRBC: 0 % (ref 0.0–0.2)

## 2019-04-19 LAB — BASIC METABOLIC PANEL WITH GFR
Anion gap: 8 (ref 5–15)
BUN: 22 mg/dL (ref 8–23)
CO2: 23 mmol/L (ref 22–32)
Calcium: 8.1 mg/dL — ABNORMAL LOW (ref 8.9–10.3)
Chloride: 107 mmol/L (ref 98–111)
Creatinine, Ser: 1.04 mg/dL (ref 0.61–1.24)
GFR calc Af Amer: >60 mL/min
GFR calc non Af Amer: >60 mL/min
Glucose, Bld: 122 mg/dL — ABNORMAL HIGH (ref 70–99)
Potassium: 3.5 mmol/L (ref 3.5–5.1)
Sodium: 138 mmol/L (ref 135–145)

## 2019-04-19 LAB — POCT ACTIVATED CLOTTING TIME
Activated Clotting Time: 235 seconds
Activated Clotting Time: 235 seconds
Activated Clotting Time: 241 seconds
Activated Clotting Time: 268 seconds

## 2019-04-19 LAB — ECHOCARDIOGRAM COMPLETE
Height: 67 in
Weight: 3308.66 oz

## 2019-04-19 LAB — MAGNESIUM: Magnesium: 2.4 mg/dL (ref 1.7–2.4)

## 2019-04-19 LAB — PROTIME-INR
INR: 0.9 (ref 0.8–1.2)
Prothrombin Time: 12.4 seconds (ref 11.4–15.2)

## 2019-04-19 LAB — APTT: aPTT: 29 s (ref 24–36)

## 2019-04-19 LAB — SARS CORONAVIRUS 2 BY RT PCR (HOSPITAL ORDER, PERFORMED IN ~~LOC~~ HOSPITAL LAB): SARS Coronavirus 2: NEGATIVE

## 2019-04-19 LAB — MRSA PCR SCREENING: MRSA by PCR: NEGATIVE

## 2019-04-19 LAB — GLUCOSE, CAPILLARY: Glucose-Capillary: 126 mg/dL — ABNORMAL HIGH (ref 70–99)

## 2019-04-19 SURGERY — CORONARY/GRAFT ACUTE MI REVASCULARIZATION
Anesthesia: Moderate Sedation

## 2019-04-19 MED ORDER — CLOPIDOGREL BISULFATE 75 MG PO TABS
75.0000 mg | ORAL_TABLET | Freq: Every day | ORAL | Status: DC
  Administered 2019-04-19: 18:00:00 75 mg via ORAL
  Filled 2019-04-19: qty 1

## 2019-04-19 MED ORDER — METHOCARBAMOL 500 MG PO TABS
500.0000 mg | ORAL_TABLET | Freq: Three times a day (TID) | ORAL | Status: DC | PRN
  Filled 2019-04-19: qty 1

## 2019-04-19 MED ORDER — SODIUM CHLORIDE 0.9 % IV SOLN
INTRAVENOUS | Status: DC
  Administered 2019-04-19: 03:00:00 20 mL via INTRAVENOUS

## 2019-04-19 MED ORDER — HEPARIN SODIUM (PORCINE) 5000 UNIT/ML IJ SOLN: 60.0000 [IU]/kg | Freq: Once | INTRAMUSCULAR | Status: DC

## 2019-04-19 MED ORDER — ALPRAZOLAM 0.25 MG PO TABS: 0.2500 mg | ORAL_TABLET | Freq: Two times a day (BID) | ORAL | Status: DC | PRN

## 2019-04-19 MED ORDER — SODIUM CHLORIDE 0.9% FLUSH
3.0000 mL | Freq: Two times a day (BID) | INTRAVENOUS | Status: DC
  Administered 2019-04-19 (×2): 3 mL via INTRAVENOUS

## 2019-04-19 MED ORDER — HEPARIN BOLUS VIA INFUSION
4000.0000 [IU] | Freq: Once | INTRAVENOUS | Status: DC
  Filled 2019-04-19: qty 4000

## 2019-04-19 MED ORDER — SODIUM CHLORIDE 0.9 % IV SOLN: INTRAVENOUS | Status: DC | PRN

## 2019-04-19 MED ORDER — POTASSIUM CHLORIDE 20 MEQ PO PACK
40.0000 meq | PACK | Freq: Once | ORAL | Status: AC
  Administered 2019-04-19: 10:00:00 40 meq via ORAL
  Filled 2019-04-19: qty 2

## 2019-04-19 MED ORDER — VERAPAMIL HCL 2.5 MG/ML IV SOLN
INTRAVENOUS | Status: DC | PRN
  Administered 2019-04-19: 2.5 mg via INTRA_ARTERIAL

## 2019-04-19 MED ORDER — ONDANSETRON HCL 4 MG/2ML IJ SOLN: 4.0000 mg | Freq: Four times a day (QID) | INTRAMUSCULAR | Status: DC | PRN

## 2019-04-19 MED ORDER — SODIUM CHLORIDE 0.9 % IV SOLN: 250.0000 mL | INTRAVENOUS | Status: DC | PRN

## 2019-04-19 MED ORDER — GALANTAMINE HYDROBROMIDE 4 MG PO TABS
12.0000 mg | ORAL_TABLET | Freq: Two times a day (BID) | ORAL | Status: DC
  Administered 2019-04-19 – 2019-04-20 (×3): 12 mg via ORAL
  Filled 2019-04-19 (×4): qty 3

## 2019-04-19 MED ORDER — CHLORHEXIDINE GLUCONATE CLOTH 2 % EX PADS: 6.0000 | MEDICATED_PAD | Freq: Every day | CUTANEOUS | Status: DC

## 2019-04-19 MED ORDER — MECLIZINE HCL 25 MG PO TABS
25.0000 mg | ORAL_TABLET | Freq: Three times a day (TID) | ORAL | Status: DC | PRN
  Filled 2019-04-19: qty 1

## 2019-04-19 MED ORDER — CYANOCOBALAMIN 500 MCG PO TABS
500.0000 ug | ORAL_TABLET | Freq: Two times a day (BID) | ORAL | Status: DC
  Administered 2019-04-19 (×2): 500 ug via ORAL
  Filled 2019-04-19 (×4): qty 1

## 2019-04-19 MED ORDER — LABETALOL HCL 5 MG/ML IV SOLN: 10.0000 mg | INTRAVENOUS | Status: AC | PRN

## 2019-04-19 MED ORDER — MELATONIN 5 MG PO TABS
1.0000 | ORAL_TABLET | Freq: Every day | ORAL | Status: DC
  Administered 2019-04-19: 5 mg via ORAL
  Filled 2019-04-19 (×2): qty 1

## 2019-04-19 MED ORDER — VERAPAMIL HCL 2.5 MG/ML IV SOLN
INTRAVENOUS | Status: AC
  Filled 2019-04-19: qty 2

## 2019-04-19 MED ORDER — NOREPINEPHRINE BITARTRATE 1 MG/ML IV SOLN
INTRAVENOUS | Status: AC
  Filled 2019-04-19: qty 4

## 2019-04-19 MED ORDER — FENTANYL CITRATE (PF) 100 MCG/2ML IJ SOLN
INTRAMUSCULAR | Status: AC
  Filled 2019-04-19: qty 2

## 2019-04-19 MED ORDER — GABAPENTIN 100 MG PO CAPS
100.0000 mg | ORAL_CAPSULE | Freq: Four times a day (QID) | ORAL | Status: DC
  Administered 2019-04-19 – 2019-04-20 (×5): 100 mg via ORAL
  Filled 2019-04-19 (×5): qty 1

## 2019-04-19 MED ORDER — SODIUM CHLORIDE 0.9 % IV BOLUS
INTRAVENOUS | Status: AC | PRN
  Administered 2019-04-19 (×2): 500 mL via INTRAVENOUS

## 2019-04-19 MED ORDER — ACETAMINOPHEN 325 MG PO TABS: 650.0000 mg | ORAL_TABLET | ORAL | Status: DC | PRN

## 2019-04-19 MED ORDER — ADULT MULTIVITAMIN W/MINERALS CH
1.0000 | ORAL_TABLET | Freq: Every day | ORAL | Status: DC
  Administered 2019-04-19: 1 via ORAL
  Filled 2019-04-19 (×2): qty 1

## 2019-04-19 MED ORDER — HEPARIN SODIUM (PORCINE) 1000 UNIT/ML IJ SOLN
INTRAMUSCULAR | Status: DC | PRN
  Administered 2019-04-19: 8000 [IU] via INTRAVENOUS
  Administered 2019-04-19: 3000 [IU] via INTRAVENOUS
  Administered 2019-04-19: 4000 [IU] via INTRAVENOUS

## 2019-04-19 MED ORDER — CLOPIDOGREL BISULFATE 75 MG PO TABS
ORAL_TABLET | ORAL | Status: DC | PRN
  Administered 2019-04-19: 300 mg via ORAL

## 2019-04-19 MED ORDER — VITAMIN D 25 MCG (1000 UNIT) PO TABS
2000.0000 [IU] | ORAL_TABLET | Freq: Every day | ORAL | Status: DC
  Administered 2019-04-19: 10:00:00 2000 [IU] via ORAL
  Filled 2019-04-19 (×2): qty 2

## 2019-04-19 MED ORDER — ENOXAPARIN SODIUM 40 MG/0.4ML ~~LOC~~ SOLN
40.0000 mg | SUBCUTANEOUS | Status: DC
  Administered 2019-04-19: 22:00:00 40 mg via SUBCUTANEOUS
  Filled 2019-04-19: qty 0.4

## 2019-04-19 MED ORDER — CLOPIDOGREL BISULFATE 75 MG PO TABS
ORAL_TABLET | ORAL | Status: AC
  Filled 2019-04-19: qty 1

## 2019-04-19 MED ORDER — SODIUM CHLORIDE 0.9 % WEIGHT BASED INFUSION: 1.0000 mL/kg/h | INTRAVENOUS | Status: AC

## 2019-04-19 MED ORDER — CLOPIDOGREL BISULFATE 75 MG PO TABS
ORAL_TABLET | ORAL | Status: AC
  Filled 2019-04-19: qty 3

## 2019-04-19 MED ORDER — METOPROLOL TARTRATE 25 MG PO TABS
12.5000 mg | ORAL_TABLET | Freq: Two times a day (BID) | ORAL | Status: DC
  Administered 2019-04-19 – 2019-04-20 (×3): 12.5 mg via ORAL
  Filled 2019-04-19 (×4): qty 1

## 2019-04-19 MED ORDER — LACTULOSE 10 GM/15ML PO SOLN: 20.0000 g | Freq: Every day | ORAL | Status: DC | PRN

## 2019-04-19 MED ORDER — PERFLUTREN LIPID MICROSPHERE
1.0000 mL | INTRAVENOUS | Status: AC | PRN
  Administered 2019-04-19: 3 mL via INTRAVENOUS
  Filled 2019-04-19: qty 10

## 2019-04-19 MED ORDER — DOCUSATE SODIUM 100 MG PO CAPS
100.0000 mg | ORAL_CAPSULE | Freq: Every day | ORAL | Status: DC
  Administered 2019-04-19: 10:00:00 100 mg via ORAL
  Filled 2019-04-19 (×2): qty 1

## 2019-04-19 MED ORDER — HEPARIN SODIUM (PORCINE) 1000 UNIT/ML IJ SOLN
INTRAMUSCULAR | Status: AC
  Filled 2019-04-19: qty 1

## 2019-04-19 MED ORDER — IOHEXOL 300 MG/ML  SOLN
INTRAMUSCULAR | Status: DC | PRN
  Administered 2019-04-19: 230 mL via INTRA_ARTERIAL

## 2019-04-19 MED ORDER — LISINOPRIL 5 MG PO TABS
2.5000 mg | ORAL_TABLET | Freq: Every day | ORAL | Status: DC
  Administered 2019-04-19 – 2019-04-20 (×2): 2.5 mg via ORAL
  Filled 2019-04-19 (×2): qty 1

## 2019-04-19 MED ORDER — SODIUM CHLORIDE 0.9% FLUSH: 3.0000 mL | INTRAVENOUS | Status: DC | PRN

## 2019-04-19 MED ORDER — MIDAZOLAM HCL 2 MG/2ML IJ SOLN
INTRAMUSCULAR | Status: AC
  Filled 2019-04-19: qty 2

## 2019-04-19 MED ORDER — FLUTICASONE PROPIONATE 50 MCG/ACT NA SUSP
2.0000 | Freq: Every day | NASAL | Status: DC
  Administered 2019-04-19: 2 via NASAL
  Filled 2019-04-19: qty 16

## 2019-04-19 MED ORDER — HEPARIN (PORCINE) IN NACL 1000-0.9 UT/500ML-% IV SOLN
INTRAVENOUS | Status: DC | PRN
  Administered 2019-04-19: 500 mL

## 2019-04-19 MED ORDER — HYDRALAZINE HCL 20 MG/ML IJ SOLN: 10.0000 mg | INTRAMUSCULAR | Status: AC | PRN

## 2019-04-19 MED ORDER — FLUOXETINE HCL 20 MG PO CAPS
20.0000 mg | ORAL_CAPSULE | Freq: Every day | ORAL | Status: DC
  Administered 2019-04-19 – 2019-04-20 (×2): 20 mg via ORAL
  Filled 2019-04-19 (×2): qty 1

## 2019-04-19 MED ORDER — HEPARIN (PORCINE) IN NACL 1000-0.9 UT/500ML-% IV SOLN
INTRAVENOUS | Status: AC
  Filled 2019-04-19: qty 1000

## 2019-04-19 MED ORDER — ATORVASTATIN CALCIUM 20 MG PO TABS
80.0000 mg | ORAL_TABLET | Freq: Every day | ORAL | Status: DC
  Administered 2019-04-19: 18:00:00 80 mg via ORAL
  Filled 2019-04-19: qty 4

## 2019-04-19 MED ORDER — ASPIRIN 81 MG PO CHEW
81.0000 mg | CHEWABLE_TABLET | Freq: Every day | ORAL | Status: DC
  Administered 2019-04-19 – 2019-04-20 (×2): 81 mg via ORAL
  Filled 2019-04-19 (×2): qty 1

## 2019-04-19 MED ORDER — RISAQUAD PO CAPS
1.0000 | ORAL_CAPSULE | Freq: Every day | ORAL | Status: DC
  Administered 2019-04-19 – 2019-04-20 (×2): 1 via ORAL
  Filled 2019-04-19 (×2): qty 1

## 2019-04-19 MED ORDER — SALINE SPRAY 0.65 % NA SOLN
2.0000 | Freq: Two times a day (BID) | NASAL | Status: DC | PRN
  Filled 2019-04-19: qty 44

## 2019-04-19 SURGICAL SUPPLY — 15 items
BALLN TREK RX 2.5X15 (BALLOONS) ×3
BALLOON TREK RX 2.5X15 (BALLOONS) IMPLANT
CATH INFINITI 5 FR JL3.5 (CATHETERS) ×2 IMPLANT
CATH INFINITI 5FR ANG PIGTAIL (CATHETERS) ×2 IMPLANT
CATH VISTA GUIDE 6FR JR4 SH (CATHETERS) ×2 IMPLANT
DEVICE INFLAT 30 PLUS (MISCELLANEOUS) ×2 IMPLANT
DEVICE RAD COMP TR BAND LRG (VASCULAR PRODUCTS) ×2 IMPLANT
GLIDESHEATH SLEND SS 6F .021 (SHEATH) ×2 IMPLANT
KIT MANI 3VAL PERCEP (MISCELLANEOUS) ×3 IMPLANT
NDL PERC 18GX7CM (NEEDLE) IMPLANT
NEEDLE PERC 18GX7CM (NEEDLE) ×3 IMPLANT
PACK CARDIAC CATH (CUSTOM PROCEDURE TRAY) ×3 IMPLANT
STENT RESOLUTE ONYX 2.75X15 (Permanent Stent) ×2 IMPLANT
WIRE ASAHI PROWATER 180CM (WIRE) ×2 IMPLANT
WIRE ROSEN-J .035X260CM (WIRE) ×2 IMPLANT

## 2019-04-19 NOTE — ED Provider Notes (Signed)
[facility] Emergency Department Provider Note  ____________________________________________   First MD Initiated Contact with Patient 04/19/19 0301     (approximate)  I have reviewed the triage vital signs and the nursing notes.   HISTORY  Chief Complaint Code STEMI  Level 5 caveat:  history/ROS limited by acute/critical illness  HPI Marvin Jefferson is a 78 y.o. male with no prior cardiac history who presents by EMS for acute onset and severe left-sided chest pain radiating up into the left side of his jaw.  The onset occurred about 2 hours ago and woke him from sleep.  He has been sweating and the pain was initially severe.  When EMS arrived they provided a full dose aspirin and 2 nitroglycerin.  This improved the pain to about a 4.  He appears critically ill but is awake and alert upon arrival to the ED.  He denies any recent fever/chills, denies shortness of breath, and he denies any contact with patients known to have COVID-19.  EMS called a code STEMI in the field and Dr. Saralyn Pilar is reportedly aware of the patient's arrival.         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   Medicare annual wellness visit, initial 09/16/2016   Lumbar disc disease 02/28/2014   Meniere's disease 02/28/2014   AR (allergic rhinitis) 02/27/2014   Cervical disc disease 02/27/2014   HTN (hypertension) 02/27/2014   Late onset  Alzheimer's disease without behavioral disturbance ([place]) 02/27/2014   OA (osteoarthritis) 02/27/2014    Past Surgical History:  Procedure Laterality Date   APPENDECTOMY     COLONOSCOPY WITH PROPOFOL N/A 05/23/2017   Procedure: COLONOSCOPY WITH PROPOFOL;  Surgeon: Lollie Sails, MD;  Location: [facility] ENDOSCOPY;  Service: Endoscopy;  Laterality: N/A;   ESOPHAGOGASTRODUODENOSCOPY (EGD) WITH PROPOFOL N/A 05/23/2017   Procedure: ESOPHAGOGASTRODUODENOSCOPY (EGD) WITH PROPOFOL;  Surgeon: Lollie Sails, MD;  Location: [facility] ENDOSCOPY;  Service: Endoscopy;  Laterality: N/A;   GANGLION CYST EXCISION Right    wrist   HERNIA REPAIR     umbilical hernia   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 N/A 08/23/2016   Procedure: 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;  Surgeon: Hollice Espy, MD;  Location: [facility] ORS;  Service: Urology;  Laterality: N/A;   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 N/A 12/06/2016   Procedure: 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;  Surgeon: Hollice Espy, MD;  Location: [facility] ORS;  Service: Urology;  Laterality: N/A;    Prior to Admission medications   Medication Sig Start Date End Date Taking? Authorizing Provider  acetaminophen (TYLENOL) 500 MG tablet Take 1,000 mg by mouth every 6 (six) hours as needed for mild pain.     [provider]  ALPRAZolam Duanne Moron) 0.25 MG tablet Take 0.25 mg by mouth 2 (two) times daily as needed (dizziness).  04/07/16   [provider]  amLODipine (NORVASC) 5 MG tablet Take 5 mg by mouth 2 (two) times daily.  06/23/16   [provider]  aspirin EC 81 MG tablet Take 81 mg by mouth daily.     [provider]  cephALEXin (KEFLEX) 500 MG capsule Take 1 capsule (500 mg total) by mouth 3 (three) times daily. 07/03/18   Paulette Blanch, MD  Cholecalciferol (VITAMIN D3) 2000 units capsule Take 2,000 Units by mouth daily.     [provider]  cyanocobalamin (V-R VITAMIN B-12) 500 MCG tablet Take 500 mcg by mouth 2 (two) times daily.     [provider]  docusate sodium (COLACE) 100 MG capsule Take 100 mg by mouth daily.     [provider]  etodolac (LODINE) 400 MG tablet Take 400 mg by mouth daily.     [provider]  FLUoxetine (PROZAC) 20 MG tablet Take 20 mg by mouth daily.    [provider]  fluticasone (FLONASE) 50 MCG/ACT nasal spray USE 2 SPRAYS IN EACH NOSTRIL EVERY DAY AS NEEDED FOR ALLERGIES 05/04/16   [provider]  gabapentin (NEURONTIN) 100 MG capsule Take 1 capsule by mouth 4 (four) times daily. 04/13/17 04/13/18  [provider]  galantamine (RAZADYNE) 12 MG tablet Take 12 mg by mouth 2 (two) times daily.  06/11/16 11/26/16  [provider]  hydrocortisone cream 1 % Apply 1 application topically daily as needed for itching.    [provider]  lactulose (CHRONULAC) 10 GM/15ML solution Take 30 mLs (20 g total) by mouth daily as needed for mild constipation. 07/03/18   Paulette Blanch, MD  meclizine (ANTIVERT) 25 MG tablet Take 25 mg by mouth 3 (three) times daily as needed for dizziness.    [provider]  Melatonin 5 MG CAPS Take 1 capsule by mouth at bedtime.    [provider]  methocarbamol (ROBAXIN) 500 MG tablet Take 500 mg by mouth 3 (three) times daily as needed for muscle spasms.    [provider]  methylphenidate (RITALIN) 5 MG tablet Take 1 tablet by mouth 2 (two) times daily. 06/10/17 07/10/17  [provider]  Multiple Vitamins-Minerals (MULTIVITAMIN WITH MINERALS) tablet Take 1 tablet by mouth daily.    [provider]  neomycin-bacitracin-polymyxin (NEOSPORIN) ointment Apply 1 application topically daily as needed for wound care. apply to eye    [provider]  nystatin cream (MYCOSTATIN) Apply 1 application topically 2 (two) times daily. 07/19/18   Hollice Espy, MD    Omega-3 Fatty Acids (FISH OIL) 1000 MG CAPS Take 1,000 mg by mouth daily.     [provider]  Probiotic Product (PROBIOTIC ADVANCED PO) Take 1 capsule by mouth daily.    [provider]  sodium chloride (OCEAN) 0.65 % SOLN nasal spray Place 2 sprays into both nostrils 2 (two) times daily as needed for congestion.    [provider]  terazosin (HYTRIN) 5 MG capsule Take 5 mg by mouth 2 (two) times daily.  03/08/16   [provider]  traMADol (ULTRAM) 50 MG tablet Take 50 mg by mouth every 6 (six) hours as needed for moderate pain.  03/08/16   [provider]  vitamin E 1000 UNIT capsule Take 1,000 Units by mouth daily.     [provider]    Allergies Clarithromycin  Family History  Problem Relation Age of Onset   Prostate cancer Father    Hypertension Father  Stroke Father    Hypertension Mother    Dementia Mother    Bladder Cancer Neg Hx    Kidney cancer Neg Hx     Social History Social History   Tobacco Use   Smoking status: Former Smoker    Quit date: 08/02/1962    Years since quitting: 56.7   Smokeless tobacco: Never Used  Substance Use Topics   Alcohol use: No   Drug use: No    Review of Systems Level 5 caveat:  history/ROS limited by acute/critical illness.  Acute onset chest pain as described above, no infectious or respiratory symptoms. ____________________________________________   PHYSICAL EXAM:  VITAL SIGNS: ED Triage Vitals  Enc Vitals Group     BP 04/19/19 0255 (!) 88/63     Pulse Rate 04/19/19 0255 67     Resp 04/19/19 0255 17     Temp 04/19/19 0255 97.9 F (36.6 C)     Temp Source 04/19/19 0255 Oral     SpO2 04/19/19 0256 93 %     Weight 04/19/19 0256 92.2 kg (203 lb 4.2 oz)     Height 04/19/19 0256 1.622 m (5' 3.86")     Head Circumference --      Peak Flow --      Pain Score 04/19/19 0256 4     Pain Loc --      Pain Edu? --      Excl. in [place]? --     Constitutional: Alert  and oriented but appears critically ill consistent with STEMI. Eyes: Conjunctivae are normal.  Head: Atraumatic. Nose: No congestion/rhinnorhea. Mouth/Throat: Mucous membranes are moist. Neck: No stridor.  No meningeal signs.   Cardiovascular: Borderline bradycardia, regular rhythm. Poor peripheral circulation. Grossly normal heart sounds. Respiratory: Normal respiratory effort.  No retractions. No audible wheezing. Gastrointestinal: Soft and nontender. No distention.  Musculoskeletal: No lower extremity tenderness nor edema. No gross deformities of extremities. Neurologic:  Normal speech and language. No gross focal neurologic deficits are appreciated.  Skin:  pallor, warm, mildly diaphoretic. No rash noted.   ____________________________________________   LABS (all labs ordered are listed, but only abnormal results are displayed)  Labs Reviewed  COMPREHENSIVE METABOLIC PANEL - Abnormal; Notable for the following components:      Result Value   Potassium 3.0 (*)    Glucose, Bld 155 (*)    Calcium 8.8 (*)    All other components within normal limits  TROPONIN I (HIGH SENSITIVITY) - Abnormal; Notable for the following components:   Troponin I (High Sensitivity) 29 (*)    All other components within normal limits  LIPID PANEL - Abnormal; Notable for the following components:   LDL Cholesterol 104 (*)    All other components within normal limits  SARS CORONAVIRUS 2 (HOSPITAL ORDER, [facility])  CBC WITH DIFFERENTIAL/PLATELET  PROTIME-INR  APTT  TROPONIN I (HIGH SENSITIVITY)   ____________________________________________  EKG  ED ECG REPORT I, Hinda Kehr, the attending physician, personally viewed and interpreted this ECG.   Date: 04/19/2019  EKG Time: 2:55 AM  Rate: 62  Rhythm: Sinus rhythm with first-degree AV block.  Axis: Normal axis  Intervals: PR interval is 262 ms  ST&T Change: Acute STEMI, likely inferior or inferioposterior.  Severe  ST elevation in 2, 3, and aVF with reciprocal changes.  ____________________________________________  RADIOLOGY   ED MD interpretation: No imaging obtained  Official radiology report(s): No results found.  ____________________________________________   PROCEDURES   Procedure(s) performed (including Critical  Care):  Marland KitchenCritical Care Performed by: Hinda Kehr, MD Authorized by: Hinda Kehr, MD   Critical care provider statement:    Critical care time (minutes):  30   Critical care time was exclusive of:  Separately billable procedures and treating other patients   Critical care was necessary to treat or prevent imminent or life-threatening deterioration of the following conditions:  Cardiac failure (STEMI)   Critical care was time spent personally by me on the following activities:  Development of treatment plan with patient or surrogate, discussions with consultants, evaluation of patient's response to treatment, examination of patient, obtaining history from patient or surrogate, ordering and performing treatments and interventions, ordering and review of laboratory studies, ordering and review of radiographic studies, pulse oximetry, re-evaluation of patient's condition and review of old charts     ____________________________________________   [place] / MDM / [place] / ED COURSE  As part of my medical decision making, I reviewed the following data within the electronic MEDICAL RECORD NUMBER History obtained from family, Nursing notes reviewed and incorporated, Labs reviewed , EKG interpreted , Old EKG reviewed, Old chart reviewed, Discussed with cardiologist (Dr. Saralyn Pilar), Discussed with admitting physician (Dr. Sidney Ace), A consult was requested and obtained from this/these consultant(s) Cardiology and Notes from prior ED visits        Differential diagnosis includes, but is not limited to, STEMI, aortic dissection, PE.  The patient having an obvious  STEMI based on both the prehospital and the initial EKG performed in the ED.  STEMI order set utilized.  The cardiologist was notified prehospital and I had him paged again and sent him the prehospital EKG as soon as I had it in my possession.  Dr. Saralyn Pilar arrived at approximately 3:00 AM and evaluated the patient in person, consented the patient, asked [place] to hold the heparin for now, and took the patient to the Cath Lab.  I notified the hospitalist service and communicated with Dr. Sidney Ace who acknowledged the admission.  Labs are pending at this time and the patient has had a directly to the Cath Lab.  COVID-19 swab was obtained prior to going to the Cath Lab but he has had no infectious or respiratory symptoms.  Patient borderline hypotensive with fluids running.  Given the concern for possible inferior STEMI no additional nitroglycerin was provided after he got to the ED and he reported his pain at about a 4.  Clinical Course as of Apr 19 407  Thu Apr 19, 2019  0256 Definite STEMI, had secretary page Dr. Saralyn Pilar    [CF]  0320 Normal CBC, other labs are still in process.   [CF]    Clinical Course User Index [CF] Hinda Kehr, MD     ____________________________________________  FINAL CLINICAL IMPRESSION(S) / ED DIAGNOSES  Final diagnoses:  ST elevation myocardial infarction (STEMI), unspecified artery ([place])     MEDICATIONS GIVEN DURING THIS VISIT:  Medications  0.9 %  sodium chloride infusion (20 mLs Intravenous New Bag/Given 04/19/19 0302)  Heparin (Porcine) in NaCl 1000-0.9 UT/500ML-% SOLN (500 mLs  Given 04/19/19 0318)  0.9 %  sodium chloride infusion ( Intravenous Canceled Entry 04/19/19 0339)  verapamil (ISOPTIN) injection (2.5 mg Intra-arterial Given 04/19/19 0321)  heparin injection (4,000 Units Intravenous Given 04/19/19 0352)  clopidogrel (PLAVIX) tablet (300 mg Oral Given 04/19/19 0323)  sodium chloride 0.9 % bolus (500 mLs Intravenous New Bag/Given 04/19/19 0330)  atorvastatin  (LIPITOR) tablet 80 mg (has no administration in time range)  iohexol (OMNIPAQUE)  300 MG/ML solution (230 mLs Intra-arterial Given 04/19/19 0406)  verapamil (ISOPTIN) 2.5 MG/ML injection (has no administration in time range)  midazolam (VERSED) 2 MG/2ML injection (has no administration in time range)  fentaNYL (SUBLIMAZE) 100 MCG/2ML injection (has no administration in time range)  heparin 1000 UNIT/ML injection (has no administration in time range)  Heparin (Porcine) in NaCl 1000-0.9 UT/500ML-% SOLN (has no administration in time range)  clopidogrel (PLAVIX) 75 MG tablet (has no administration in time range)  clopidogrel (PLAVIX) 75 MG tablet (has no administration in time range)  norepinephrine (LEVOPHED) 1 MG/ML injection (has no administration in time range)  heparin 1000 UNIT/ML injection (has no administration in time range)     ED Discharge Orders         Ordered    AMB Referral to Cardiac Rehabilitation - Phase II     04/19/19 0405          *Please note:  TIMMEY LAMBA was evaluated in Emergency Department on 04/19/2019 for the symptoms described in the history of present illness. He was evaluated in the context of the global COVID-19 pandemic, which necessitated consideration that the patient might be at risk for infection with the SARS-CoV-2 virus that causes COVID-19. Institutional protocols and algorithms that pertain to the evaluation of patients at risk for COVID-19 are in a state of rapid change based on information released by regulatory bodies including the CDC and federal and state organizations. These policies and algorithms were followed during the patient's care in the ED.  Some ED evaluations and interventions may be delayed as a result of limited staffing during the pandemic.*  Note:  This document was prepared using Dragon voice recognition software and may include unintentional dictation errors.   Hinda Kehr, MD 04/19/19 [phone]

## 2019-04-19 NOTE — Care Plan (Signed)
Mr. Marvin Jefferson is a 78 year old male with a past medical history notable for hypertension, dementia, depression, anxiety, degenerative disc disease, chronic back pain who presented to [facility] ED on 04/19/2019 with complaints of chest pain.  He described the chest pain as left-sided and 8 out of 10, with radiation to the left jaw with associated diaphoresis.  His EKG revealed ST elevation in the inferior leads.  His initial high-sensitivity troponin is 29, and his SARS-CoV-2 PCR is negative.  All other lab work is unremarkable. He was taken emergently for cardiac catheterization by Dr. Saralyn Pilar.  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 His estimated EF is 50%.  He returns to ICU post procedure.  He is currently pain-free and hemodynamically stable.  Cardiology is admitting and primary service.  PCCM is available as needed for any critical care needs.    Darel Hong, AGACNP-BC [facility] Pager: [phone] Cell: [phone]

## 2019-04-19 NOTE — Progress Notes (Signed)
[facility] Progress Note Patient Name: Marvin Jefferson DOB: 03-Jun-1941 MRN: [id number]   Date of Service  04/19/2019  HPI/Events of Note  Pt was admitted with ACS and is s/p PCI with placement of a drug eluting stent. He is hemodynamically stable post-cath. Cariology is primary with PCCM consulting  eICU Interventions  New patient evaluation completed.        Kerry Kass Tadao Emig 04/19/2019, 5:24 AM

## 2019-04-19 NOTE — H&P (Signed)
[facility] [place] NAME: Marvin Jefferson    MR#:  [id number]  DATE OF BIRTH:  01/29/1941  DATE OF ADMISSION:  04/19/2019  PRIMARY CARE PHYSICIAN: Rusty Aus, MD   REQUESTING/REFERRING PHYSICIAN: Hinda Kehr, MD CHIEF COMPLAINT:   Chief Complaint  Patient presents with  . Code STEMI    HISTORY OF PRESENT ILLNESS:  Rashawd Jefferson  is a 78 y.o. Caucasian male with a known history of hypertension, depression, dementia, Mnire's disease and DJD with chronic back pain, who presented to the emergency room with acute onset of midsternal chest pain felt as cramping extending across his chest, graded 8/10 in severity with radiation to his left jaw with associated diaphoresis, dyspnea and palpitations.  He denied any nausea or vomiting.  No cough or wheezing or hemoptysis.  No recent leg pain or edema or recent travels or surgeries.  No fever or chills.  No recent sick exposures.  Upon presentation to the emergency room, his vital signs were within normal with a blood pressure 103/64, pulse of 77 respiratory 1217 temperature 97.6 with a pulse oximetry of 98% on room air.  His blood pressure later on is gone down to 87/63.  His EKG revealed ST segment elevation in the inferior leads as well as lateral leads with reciprocal T wave reversion in anteroseptal leads.  Labs were remarkable for hypokalemia of 3 that was replaced, blood glucose of 155 and COVID-19 disc and back negative.  High sensitive troponin came back elevated at 29.  The patient was taken to the Cath Lab.  He underwent cardiac catheterization revealing 100% distal RCA stenosis and underwent successful PCI with drug-eluting stent.  His EF was estimated at 50%.  He was pain-free during my interview.  He is admitted to the ICU for further management. PAST MEDICAL HISTORY:   Past Medical History:  Diagnosis Date  . Anxiety   . Chronic back pain   . DDD (degenerative disc disease),  cervical   . DDD (degenerative disc disease), lumbar   . Dementia ([place])   . Depression   . Hypertension   . Meniere's disease   . Pneumonia 2015  . Umbilical hernia   . UTI (urinary tract infection)     PAST SURGICAL HISTORY:   Past Surgical History:  Procedure Laterality Date  . APPENDECTOMY    . COLONOSCOPY WITH PROPOFOL N/A 05/23/2017   Procedure: COLONOSCOPY WITH PROPOFOL;  Surgeon: Lollie Sails, MD;  Location[facility] ENDOSCOPY;  Service: Endoscopy;  Laterality: N/A;  . ESOPHAGOGASTRODUODENOSCOPY (EGD) WITH PROPOFOL N/A 05/23/2017   Procedure: ESOPHAGOGASTRODUODENOSCOPY (EGD) WITH PROPOFOL;  Surgeon: Lollie Sails, MD;  Location: [facility] ENDOSCOPY;  Service: Endoscopy;  Laterality: N/A;  . GANGLION CYST EXCISION Right    wrist  . HERNIA REPAIR     umbilical hernia  . 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 N/A 08/23/2016   Procedure: 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;  Surgeon: Hollice Espy, MD;  Location: [facility] ORS;  Service: Urology;  Laterality: N/A;  . 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 N/A 12/06/2016   Procedure: 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;  Surgeon: Hollice Espy, MD;  Location: [facility] ORS;  Service: Urology;  Laterality: N/A;    SOCIAL HISTORY:   Social History   Tobacco Use  . Smoking status: Former Smoker    Quit date: 08/02/1962    Years since quitting: 56.7  . Smokeless tobacco: Never Used  Substance  Use Topics  . Alcohol use: No    FAMILY HISTORY:   Family History  Problem Relation Age of Onset  . Prostate cancer Father   . Hypertension Father   . Stroke Father   . Hypertension Mother   . Dementia Mother   . Bladder Cancer Neg Hx   . Kidney cancer Neg Hx     DRUG ALLERGIES:   Allergies  Allergen Reactions  . Clarithromycin Other (See Comments)    Terrible taste and smell    REVIEW OF SYSTEMS:   ROS As per history of present illness. All  pertinent systems were reviewed above. Constitutional,  HEENT, cardiovascular, respiratory, GI, GU, musculoskeletal, neuro, psychiatric, endocrine,  integumentary and hematologic systems were reviewed and are otherwise  negative/unremarkable except for positive findings mentioned above in the HPI.   MEDICATIONS AT HOME:   Prior to Admission medications   Medication Sig Start Date End Date Taking? Authorizing Provider  acetaminophen (TYLENOL) 500 MG tablet Take 1,000 mg by mouth every 6 (six) hours as needed for mild pain.     [provider]  ALPRAZolam Duanne Moron) 0.25 MG tablet Take 0.25 mg by mouth 2 (two) times daily as needed (dizziness).  04/07/16   [provider]  amLODipine (NORVASC) 5 MG tablet Take 5 mg by mouth 2 (two) times daily.  06/23/16   [provider]  aspirin EC 81 MG tablet Take 81 mg by mouth daily.     [provider]  cephALEXin (KEFLEX) 500 MG capsule Take 1 capsule (500 mg total) by mouth 3 (three) times daily. 07/03/18   Paulette Blanch, MD  Cholecalciferol (VITAMIN D3) 2000 units capsule Take 2,000 Units by mouth daily.     [provider]  cyanocobalamin (V-R VITAMIN B-12) 500 MCG tablet Take 500 mcg by mouth 2 (two) times daily.     [provider]  docusate sodium (COLACE) 100 MG capsule Take 100 mg by mouth daily.     [provider]  etodolac (LODINE) 400 MG tablet Take 400 mg by mouth daily.     [provider]  FLUoxetine (PROZAC) 20 MG tablet Take 20 mg by mouth daily.    [provider]  fluticasone (FLONASE) 50 MCG/ACT nasal spray USE 2 SPRAYS IN EACH NOSTRIL EVERY DAY AS NEEDED FOR ALLERGIES 05/04/16   [provider]  gabapentin (NEURONTIN) 100 MG capsule Take 1 capsule by mouth 4 (four) times daily. 04/13/17 04/13/18  [provider]  galantamine (RAZADYNE) 12 MG tablet Take 12 mg by mouth 2 (two) times daily.  06/11/16 11/26/16  [provider]  hydrocortisone  cream 1 % Apply 1 application topically daily as needed for itching.    [provider]  lactulose (CHRONULAC) 10 GM/15ML solution Take 30 mLs (20 g total) by mouth daily as needed for mild constipation. 07/03/18   Paulette Blanch, MD  meclizine (ANTIVERT) 25 MG tablet Take 25 mg by mouth 3 (three) times daily as needed for dizziness.    [provider]  Melatonin 5 MG CAPS Take 1 capsule by mouth at bedtime.    [provider]  methocarbamol (ROBAXIN) 500 MG tablet Take 500 mg by mouth 3 (three) times daily as needed for muscle spasms.    [provider]  methylphenidate (RITALIN) 5 MG tablet Take 1 tablet by mouth 2 (two) times daily. 06/10/17 07/10/17  [provider]  Multiple Vitamins-Minerals (MULTIVITAMIN WITH MINERALS) tablet Take 1 tablet by mouth daily.  [provider]  neomycin-bacitracin-polymyxin (NEOSPORIN) ointment Apply 1 application topically daily as needed for wound care. apply to eye    [provider]  nystatin cream (MYCOSTATIN) Apply 1 application topically 2 (two) times daily. 07/19/18   Hollice Espy, MD  Omega-3 Fatty Acids (FISH OIL) 1000 MG CAPS Take 1,000 mg by mouth daily.     [provider]  Probiotic Product (PROBIOTIC ADVANCED PO) Take 1 capsule by mouth daily.    [provider]  sodium chloride (OCEAN) 0.65 % SOLN nasal spray Place 2 sprays into both nostrils 2 (two) times daily as needed for congestion.    [provider]  terazosin (HYTRIN) 5 MG capsule Take 5 mg by mouth 2 (two) times daily.  03/08/16   [provider]  traMADol (ULTRAM) 50 MG tablet Take 50 mg by mouth every 6 (six) hours as needed for moderate pain.  03/08/16   [provider]  vitamin E 1000 UNIT capsule Take 1,000 Units by mouth daily.     [provider]      VITAL SIGNS:  Blood pressure 102/68, pulse 60, temperature 97.6 F (36.4 C), temperature source Oral, resp. rate 13,  height 5\' 7"  (1.702 m), weight 93.8 kg, SpO2 98 %.  PHYSICAL EXAMINATION:  Physical Exam  GENERAL:  78 y.o.-year-old patient Caucasian male lying in the bed with no acute distress.  EYES: Pupils equal, round, reactive to light and accommodation. No scleral icterus. Extraocular muscles intact.  HEENT: Head atraumatic, normocephalic. Oropharynx and nasopharynx clear.  NECK:  Supple, no jugular venous distention. No thyroid enlargement, no tenderness.  LUNGS: Normal breath sounds bilaterally, no wheezing, rales,rhonchi or crepitation. No use of accessory muscles of respiration.  CARDIOVASCULAR: Regular rate and rhythm, S1, S2 normal. No murmurs, rubs, or gallops.  ABDOMEN: Soft, nondistended, nontender. Bowel sounds present. No organomegaly or mass.  EXTREMITIES: No pedal edema, cyanosis, or clubbing.  NEUROLOGIC: Cranial nerves II through XII are intact. Muscle strength 5/5 in all extremities. Sensation intact. Gait not checked.  PSYCHIATRIC: The patient is alert and oriented x 3.  Normal affect and good eye contact. SKIN: No obvious rash, lesion, or ulcer.   LABORATORY PANEL:   CBC Recent Labs  Lab 04/19/19 0513  WBC 11.3*  HGB 13.6  HCT 39.8  PLT 155   ------------------------------------------------------------------------------------------------------------------  Chemistries  Recent Labs  Lab 04/19/19 0259 04/19/19 0513  NA 139 138  K 3.0* 3.5  CL 107 107  CO2 24 23  GLUCOSE 155* 122*  BUN 22 22  CREATININE 1.01 1.04  CALCIUM 8.8* 8.1*  AST 28  --   ALT 19  --   ALKPHOS 91  --   BILITOT 1.0  --    ------------------------------------------------------------------------------------------------------------------  Cardiac Enzymes No results for input(s): TROPONINI in the last 168 hours. ------------------------------------------------------------------------------------------------------------------  RADIOLOGY:  No results found.    IMPRESSION AND PLAN:    1.  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  The patient is placed on IV heparin as well as p.o. aspirin and Plavix, statin therapy.  Dr. Saralyn Pilar is following.  Beta-blocker therapy is being held off given his hypotension.  2.  Hypotension.  I ordered a bolus of 250 mL IV normal saline we will hold off his antihypertensives.  3.  Hypokalemia.  Potassium was replaced and will be further optimized.  Will check magnesium level.  4. Depression with anxiety.  We will continue his Prozac and PRN Xanax.  5.  Peripheral neuropathy.  Neurontin will be resumed.  6.  Dementia.  His galantamine will be continued.  7.  DVT prophylaxis.  The patient is on IV heparin.   All the records are reviewed and case discussed with ED provider. The plan of care was discussed in details with the patient (and family). I answered all questions. The patient agreed to proceed with the above mentioned plan. Further management will depend upon hospital course.   CODE STATUS: Full code  TOTAL TIME TAKING CARE OF THIS PATIENT: 45 minutes.    Christel Mormon M.D on 04/19/2019 at 7:07 AM  Pager - [phone]  After 6pm go to [email or web address] - Proofreader  [facility]  Office  [phone]  CC: Primary care physician; Rusty Aus, MD   Note: This dictation was prepared with Dragon dictation along with smaller phrase technology. Any transcriptional errors that result from this process are unintentional.

## 2019-04-19 NOTE — Progress Notes (Signed)
CRITICAL VALUE ALERT  Critical Value:  Troponin 17,236  Date & Time Notied:  3299 04/19/19  Provider Notified: MD Paraschos

## 2019-04-19 NOTE — ED Triage Notes (Signed)
Pt to the er for chest pain rated 8/10 initially. Pt received 4 asa 81mg  and 2 nitro. Pt is diaphoretic and still having midsternal chest pain tha tis 4/10. Hx of alzheimers but new onset.

## 2019-04-19 NOTE — ED Notes (Signed)
Heparin held per dr Josefa Half.

## 2019-04-19 NOTE — Consult Note (Signed)
[facility] Cardiology  CARDIOLOGY CONSULT NOTE  Patient ID: Marvin Jefferson MRN: [id number] DOB/AGE: Apr 04, 1941 78 y.o.  Admit date: 04/19/2019 Referring Physician Karma Greaser Primary Physician [place] Primary Cardiologist  Reason for Consultation inferior ST elevation myocardial infarction  HPI: 78 year old gentleman referred for inferior ST elevation myocardial infarction.  The patient was in his usual state of health until this evening when he developed 8 out of 10 chest pain.  Patient presented to [facility] emergency room where ECG revealed ST elevations in the inferior leads consistent with acute inferior ST elevation myocardial infarction.  Review of systems complete and found to be negative unless listed above     Past Medical History:  Diagnosis Date  . Anxiety   . Chronic back pain   . DDD (degenerative disc disease), cervical   . DDD (degenerative disc disease), lumbar   . Dementia ([place])   . Depression   . Hypertension   . Meniere's disease   . Pneumonia 2015  . Umbilical hernia   . UTI (urinary tract infection)     Past Surgical History:  Procedure Laterality Date  . APPENDECTOMY    . COLONOSCOPY WITH PROPOFOL N/A 05/23/2017   Procedure: COLONOSCOPY WITH PROPOFOL;  Surgeon: Lollie Sails, MD;  Location: [facility] ENDOSCOPY;  Service: Endoscopy;  Laterality: N/A;  . ESOPHAGOGASTRODUODENOSCOPY (EGD) WITH PROPOFOL N/A 05/23/2017   Procedure: ESOPHAGOGASTRODUODENOSCOPY (EGD) WITH PROPOFOL;  Surgeon: Lollie Sails, MD;  Location: [facility] ENDOSCOPY;  Service: Endoscopy;  Laterality: N/A;  . GANGLION CYST EXCISION Right    wrist  . HERNIA REPAIR     umbilical hernia  . 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 N/A 08/23/2016   Procedure: 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;  Surgeon: Hollice Espy, MD;  Location: [facility] ORS;  Service: Urology;  Laterality: N/A;  . 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 N/A 12/06/2016   Procedure:  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;  Surgeon: Hollice Espy, MD;  Location: [facility] ORS;  Service: Urology;  Laterality: N/A;    Medications Prior to Admission  Medication Sig Dispense Refill Last Dose  . acetaminophen (TYLENOL) 500 MG tablet Take 1,000 mg by mouth every 6 (six) hours as needed for mild pain.      Marland Kitchen ALPRAZolam (XANAX) 0.25 MG tablet Take 0.25 mg by mouth 2 (two) times daily as needed (dizziness).      Marland Kitchen amLODipine (NORVASC) 5 MG tablet Take 5 mg by mouth 2 (two) times daily.      Marland Kitchen aspirin EC 81 MG tablet Take 81 mg by mouth daily.      . cephALEXin (KEFLEX) 500 MG capsule Take 1 capsule (500 mg total) by mouth 3 (three) times daily. 21 capsule 0   . Cholecalciferol (VITAMIN D3) 2000 units capsule Take 2,000 Units by mouth daily.      . cyanocobalamin (V-R VITAMIN B-12) 500 MCG tablet Take 500 mcg by mouth 2 (two) times daily.      Marland Kitchen docusate sodium (COLACE) 100 MG capsule Take 100 mg by mouth daily.      Marland Kitchen etodolac (LODINE) 400 MG tablet Take 400 mg by mouth daily.      Marland Kitchen FLUoxetine (PROZAC) 20 MG tablet Take 20 mg by mouth daily.     . fluticasone (FLONASE) 50 MCG/ACT nasal spray USE 2 SPRAYS IN EACH NOSTRIL EVERY DAY AS NEEDED FOR ALLERGIES     . gabapentin (NEURONTIN) 100 MG capsule Take 1 capsule by mouth 4 (four) times daily.     Marland Kitchen  galantamine (RAZADYNE) 12 MG tablet Take 12 mg by mouth 2 (two) times daily.      . hydrocortisone cream 1 % Apply 1 application topically daily as needed for itching.     . lactulose (CHRONULAC) 10 GM/15ML solution Take 30 mLs (20 g total) by mouth daily as needed for mild constipation. 120 mL 0   . meclizine (ANTIVERT) 25 MG tablet Take 25 mg by mouth 3 (three) times daily as needed for dizziness.     . Melatonin 5 MG CAPS Take 1 capsule by mouth at bedtime.     . methocarbamol (ROBAXIN) 500 MG tablet Take 500 mg by mouth 3 (three) times daily as needed for muscle spasms.     . methylphenidate (RITALIN) 5 MG tablet Take 1  tablet by mouth 2 (two) times daily.     . Multiple Vitamins-Minerals (MULTIVITAMIN WITH MINERALS) tablet Take 1 tablet by mouth daily.     Marland Kitchen neomycin-bacitracin-polymyxin (NEOSPORIN) ointment Apply 1 application topically daily as needed for wound care. apply to eye     . nystatin cream (MYCOSTATIN) Apply 1 application topically 2 (two) times daily. 30 g 0   . Omega-3 Fatty Acids (FISH OIL) 1000 MG CAPS Take 1,000 mg by mouth daily.      . Probiotic Product (PROBIOTIC ADVANCED PO) Take 1 capsule by mouth daily.     . sodium chloride (OCEAN) 0.65 % SOLN nasal spray Place 2 sprays into both nostrils 2 (two) times daily as needed for congestion.     Marland Kitchen terazosin (HYTRIN) 5 MG capsule Take 5 mg by mouth 2 (two) times daily.      . traMADol (ULTRAM) 50 MG tablet Take 50 mg by mouth every 6 (six) hours as needed for moderate pain.      . vitamin E 1000 UNIT capsule Take 1,000 Units by mouth daily.       Social History   Socioeconomic History  . Marital status: Married    Spouse name: Not on file  . Number of children: Not on file  . Years of education: Not on file  . Highest education level: Not on file  Occupational History  . Not on file  Social Needs  . Financial resource strain: Not on file  . Food insecurity    Worry: Not on file    Inability: Not on file  . Transportation needs    Medical: Not on file    Non-medical: Not on file  Tobacco Use  . Smoking status: Former Smoker    Quit date: 08/02/1962    Years since quitting: 56.7  . Smokeless tobacco: Never Used  Substance and Sexual Activity  . Alcohol use: No  . Drug use: No  . Sexual activity: Not on file  Lifestyle  . Physical activity    Days per week: Not on file    Minutes per session: Not on file  . Stress: Not on file  Relationships  . Social Herbalist on phone: Not on file    Gets together: Not on file    Attends religious service: Not on file    Active member of club or organization: Not on file     Attends meetings of clubs or organizations: Not on file    Relationship status: Not on file  . Intimate partner violence    Fear of current or ex partner: Not on file    Emotionally abused: Not on file    Physically abused: Not  on file    Forced sexual activity: Not on file  Other Topics Concern  . Not on file  Social History Narrative  . Not on file    Family History  Problem Relation Age of Onset  . Prostate cancer Father   . Hypertension Father   . Stroke Father   . Hypertension Mother   . Dementia Mother   . Bladder Cancer Neg Hx   . Kidney cancer Neg Hx       Review of systems complete and found to be negative unless listed above      PHYSICAL EXAM  General: Well developed, well nourished, in no acute distress HEENT:  Normocephalic and atramatic Neck:  No JVD.  Lungs: Clear bilaterally to auscultation and percussion. Heart: HRRR . Normal S1 and S2 without gallops or murmurs.  Abdomen: Bowel sounds are positive, abdomen soft and non-tender  Msk:  Back normal, normal gait. Normal strength and tone for age. Extremities: No clubbing, cyanosis or edema.   Neuro: Alert and oriented X 3. Psych:  Good affect, responds appropriately  Labs:   Lab Results  Component Value Date   WBC 5.9 04/19/2019   HGB 14.8 04/19/2019   HCT 42.5 04/19/2019   MCV 90.6 04/19/2019   PLT 172 04/19/2019    Recent Labs  Lab 04/19/19 0259  NA 139  K 3.0*  CL 107  CO2 24  BUN 22  CREATININE 1.01  CALCIUM 8.8*  PROT 7.0  BILITOT 1.0  ALKPHOS 91  ALT 19  AST 28  GLUCOSE 155*   Lab Results  Component Value Date   TROPONINI <0.03 03/05/2018    Lab Results  Component Value Date   CHOL 162 04/19/2019   Lab Results  Component Value Date   HDL 44 04/19/2019   Lab Results  Component Value Date   LDLCALC 104 (H) 04/19/2019   Lab Results  Component Value Date   TRIG 70 04/19/2019   Lab Results  Component Value Date   CHOLHDL 3.7 04/19/2019   No results found for:  LDLDIRECT    Radiology: No results found.  EKG: Sinus rhythm with ST elevation in leads II, III and aVF  ASSESSMENT AND PLAN:    1.  Acute inferior ST elevation myocardial infarction  Recommendations  1.  Urgent cardiac catheterization and probable primary PCI  Signed: Isaias Cowman MD,PhD, [facility] 04/19/2019, 4:22 AM

## 2019-04-19 NOTE — ED Notes (Signed)
To Cath Lab.

## 2019-04-19 NOTE — Progress Notes (Signed)
*  PRELIMINARY RESULTS* Echocardiogram 2D Echocardiogram has been performed.  Marvin Jefferson [name] 04/19/2019, 9:35 AM

## 2019-04-19 NOTE — Progress Notes (Signed)
[place] [facility] [place] NAME: Hilding Quintanar    MR#:  [id number]  DATE OF BIRTH:  [birth year]  SUBJECTIVE:  CHIEF COMPLAINT:   Chief Complaint  Patient presents with  . Code STEMI   No new complaint this morning.  Denies any chest pain.  Patient admitted with STEMI and had cardiac catheterization with stent placement last night.  REVIEW OF SYSTEMS:  Review of Systems  Constitutional: Negative for chills and fever.  HENT: Negative for hearing loss and tinnitus.   Eyes: Negative for blurred vision and double vision.  Respiratory: Negative for cough and shortness of breath.   Cardiovascular: Negative for palpitations.       Chest pain resolved already  Gastrointestinal: Negative for heartburn and nausea.  Genitourinary: Negative for dysuria and urgency.  Musculoskeletal: Negative for myalgias and neck pain.  Skin: Negative for itching and rash.  Neurological: Negative for dizziness and headaches.  Psychiatric/Behavioral: Negative for depression and hallucinations.    DRUG ALLERGIES:   Allergies  Allergen Reactions  . Clarithromycin Other (See Comments)    Terrible taste and smell   VITALS:  Blood pressure 97/70, pulse (!) 58, temperature 97.9 F (36.6 C), temperature source Oral, resp. rate 14, height 5\' 7"  (1.702 m), weight 93.8 kg, SpO2 97 %. PHYSICAL EXAMINATION:  Physical Exam  GENERAL:  78 y.o.-year-old patient Caucasian male lying in the bed with no acute distress.  EYES: Pupils equal, round, reactive to light and accommodation. No scleral icterus. Extraocular muscles intact.  HEENT: Head atraumatic, normocephalic. Oropharynx and nasopharynx clear.  NECK:  Supple, no jugular venous distention. No thyroid enlargement, no tenderness.  LUNGS: Normal breath sounds bilaterally, no wheezing, rales,rhonchi or crepitation. No use of accessory muscles of respiration.  CARDIOVASCULAR: Regular rate and rhythm, S1, S2 normal. No  murmurs, rubs, or gallops.  ABDOMEN: Soft, nondistended, nontender. Bowel sounds present. No organomegaly or mass.  EXTREMITIES: No pedal edema, cyanosis, or clubbing.  NEUROLOGIC: Cranial nerves II through XII are intact. Muscle strength 5/5 in all extremities. Sensation intact. Gait not checked.  PSYCHIATRIC: The patient is alert and oriented x 3.  Normal affect and good eye contact. SKIN: No obvious rash, lesion, or ulcer.   LABORATORY PANEL:  Male CBC Recent Labs  Lab 04/19/19 0513  WBC 11.3*  HGB 13.6  HCT 39.8  PLT 155   ------------------------------------------------------------------------------------------------------------------ Chemistries  Recent Labs  Lab 04/19/19 0259 04/19/19 0513  NA 139 138  K 3.0* 3.5  CL 107 107  CO2 24 23  GLUCOSE 155* 122*  BUN 22 22  CREATININE 1.01 1.04  CALCIUM 8.8* 8.1*  MG  --  2.4  AST 28  --   ALT 19  --   ALKPHOS 91  --   BILITOT 1.0  --    RADIOLOGY:  No results found. ASSESSMENT AND PLAN:   1.  STEMI  Patient status post cardiac cath with distal RCA 100% occlusion status post PCI and stent.  Continue p.o. aspirin and Plavix, statin therapy.  Dr. Saralyn Pilar is following.  Heparin already discontinued.  Patient on low-dose of beta-blocker and ACE inhibitor. 2D echocardiogram done with ejection fraction of 60 to 65%.  2.  Hypotension. Blood pressure improved with IV fluids.  Patient on very low dose of ACE inhibitor and beta-blocker.  Monitor  3.  Hypokalemia.   Replaced   4. Depression with anxiety.  We will continue his Prozac and PRN Xanax.  5.  Peripheral neuropathy.  Neurontin will be resumed.  6.  Dementia.  His galantamine will be continued.  DVT prophylaxis.  Recommend subacute  Lovenox if okay with cardiologist and critical care physician   All the records are reviewed and case discussed with Care Management/Social Worker. Management plans discussed with the patient, family and they are in  agreement.  CODE STATUS: Full Code  TOTAL TIME TAKING CARE OF THIS PATIENT: 37 minutes.   More than 50% of the time was spent in counseling/coordination of care: YES  POSSIBLE D/C IN 2 DAYS, DEPENDING ON CLINICAL CONDITION.   Keneshia Tena M.D on 04/19/2019 at 2:02 PM  Between 7am to 6pm - Pager - [phone]  After 6pm go to [email or web address] - Proofreader  [facility]  Office  [phone]  CC: Primary care physician; Rusty Aus, MD  Note: This dictation was prepared with Dragon dictation along with smaller phrase technology. Any transcriptional errors that result from this process are unintentional.

## 2019-04-19 NOTE — Progress Notes (Signed)
Patient having increased runs of V-Tach, with a 14 run of V-Tach and wide QRS rhythm. MD Paraschos notified.

## 2019-04-20 ENCOUNTER — Inpatient Hospital Stay: Payer: Medicare Other

## 2019-04-20 LAB — BASIC METABOLIC PANEL
Anion gap: 4 — ABNORMAL LOW (ref 5–15)
BUN: 19 mg/dL (ref 8–23)
CO2: 24 mmol/L (ref 22–32)
Calcium: 8.5 mg/dL — ABNORMAL LOW (ref 8.9–10.3)
Chloride: 111 mmol/L (ref 98–111)
Creatinine, Ser: 0.99 mg/dL (ref 0.61–1.24)
GFR calc Af Amer: >60 mL/min (ref >60)
GFR calc non Af Amer: >60 mL/min (ref >60)
Glucose, Bld: 93 mg/dL (ref 70–99)
Potassium: 3.8 mmol/L (ref 3.5–5.1)
Sodium: 139 mmol/L (ref 135–145)

## 2019-04-20 LAB — CBC
HCT: 38.3 % — ABNORMAL LOW (ref 39.0–52.0)
Hemoglobin: 13.2 g/dL (ref 13.0–17.0)
MCH: 32 pg (ref 26.0–34.0)
MCHC: 34.5 g/dL (ref 30.0–36.0)
MCV: 92.7 fL (ref 80.0–100.0)
Platelets: 155 10*3/uL (ref 150–400)
RBC: 4.13 MIL/uL — ABNORMAL LOW (ref 4.22–5.81)
RDW: 12.6 % (ref 11.5–15.5)
WBC: 6.9 10*3/uL (ref 4.0–10.5)
nRBC: 0 % (ref 0.0–0.2)

## 2019-04-20 LAB — PROCALCITONIN: Procalcitonin: <0.1 ng/mL

## 2019-04-20 MED ORDER — ASPIRIN 81 MG PO CHEW: 81.0000 mg | CHEWABLE_TABLET | Freq: Every day | ORAL | 0 refills | Status: AC

## 2019-04-20 MED ORDER — LISINOPRIL 2.5 MG PO TABS: 2.5000 mg | ORAL_TABLET | Freq: Every day | ORAL | 0 refills | Status: DC

## 2019-04-20 MED ORDER — MELATONIN 5 MG PO TABS: 1.0000 | ORAL_TABLET | Freq: Every day | ORAL | 0 refills | Status: DC

## 2019-04-20 MED ORDER — METOPROLOL TARTRATE 25 MG PO TABS: 12.5000 mg | ORAL_TABLET | Freq: Two times a day (BID) | ORAL | 0 refills | Status: DC

## 2019-04-20 MED ORDER — ADULT MULTIVITAMIN W/MINERALS CH: 1.0000 | ORAL_TABLET | Freq: Every day | ORAL | 0 refills | Status: AC

## 2019-04-20 MED ORDER — ATORVASTATIN CALCIUM 80 MG PO TABS: 80.0000 mg | ORAL_TABLET | Freq: Every day | ORAL | 0 refills | Status: AC

## 2019-04-20 NOTE — Progress Notes (Addendum)
[facility] Cardiology  SUBJECTIVE: Mr. Marvin Jefferson is a 78 year old male with a past medical history significant for hypertension, depression, dementia, and degenerative disc disease who presented to the ED on 04/19/19 for an acute onset of midsternal chest pain, 8/10 in severity.   ECG upon presentation revealed inferior ST segment elevations with reciprocal changes in anteroseptal leads.  He was emergently taken to the cath lab where he underwent PCI with DES to distal RCA with Dr. Saralyn Pilar.    Today, he reports that he is "isn't feeling well." Admits to chest discomfort when taking a deep breath.  No chest pain otherwise.  Admits to upper back pain, which is chronic for him. Denies palpitations or shortness of breath.  Denies orthopnea or lower extremity swelling.     Vitals:   04/20/19 0100 04/20/19 0200 04/20/19 0300 04/20/19 0400  BP: (!) 98/59 (!) 88/59 94/68 98/72   Pulse: (!) 51 (!) 51 (!) 58 (!) 53  Resp: 12 (!) 9 18 (!) 29  Temp:      TempSrc:      SpO2: 95% 93% 95% 90%  Weight:      Height:         Intake/Output Summary (Last 24 hours) at 04/20/2019 0737 Last data filed at 04/19/2019 1853 Gross per 24 hour  Intake 884.7 ml  Output 350 ml  Net 534.7 ml      PHYSICAL EXAM  General: Well developed, well nourished, in no acute distress HEENT:  Normocephalic and atramatic Neck:  No JVD.  Lungs: Clear bilaterally to auscultation and percussion. Heart: HRRR . Normal S1 and S2 without gallops or murmurs.  Abdomen: Bowel sounds are positive, abdomen soft and non-tender  Msk:  Back normal. Normal strength and tone for age. Extremities: No clubbing, cyanosis or edema.   Neuro: Alert and oriented X 3. Psych:  Good affect, responds appropriately   LABS: Basic Metabolic Panel: Recent Labs    04/19/19 0513 04/20/19 0438  NA 138 139  K 3.5 3.8  CL 107 111  CO2 23 24  GLUCOSE 122* 93  BUN 22 19  CREATININE 1.04 0.99  CALCIUM 8.1* 8.5*  MG 2.4  --    Liver Function Tests: Recent  Labs    04/19/19 0259  AST 28  ALT 19  ALKPHOS 91  BILITOT 1.0  PROT 7.0  ALBUMIN 4.1   No results for input(s): LIPASE, AMYLASE in the last 72 hours. CBC: Recent Labs    04/19/19 0259 04/19/19 0513  WBC 5.9 11.3*  NEUTROABS 3.1  --   HGB 14.8 13.6  HCT 42.5 39.8  MCV 90.6 92.1  PLT 172 155   Cardiac Enzymes: No results for input(s): CKTOTAL, CKMB, CKMBINDEX, TROPONINI in the last 72 hours. BNP: Invalid input(s): POCBNP D-Dimer: No results for input(s): DDIMER in the last 72 hours. Hemoglobin A1C: No results for input(s): HGBA1C in the last 72 hours. Fasting Lipid Panel: Recent Labs    04/19/19 0259  CHOL 162  HDL 44  LDLCALC 104*  TRIG 70  CHOLHDL 3.7   Thyroid Function Tests: No results for input(s): TSH, T4TOTAL, T3FREE, THYROIDAB in the last 72 hours.  Invalid input(s): FREET3 Anemia Panel: No results for input(s): VITAMINB12, FOLATE, FERRITIN, TIBC, IRON, RETICCTPCT in the last 72 hours.  No results found.   Echo:  Normal LV function with no evidence of significant valvular abnormalities.  No evidence of pericardial effusion   TELEMETRY: Normal sinus rhythm   ASSESSMENT AND PLAN:  Active  Problems:   STEMI involving oth coronary artery of inferior wall (HCC)   STEMI (ST elevation myocardial infarction) ([place])    1.  STEMI s/p PCI to RCA  -Patient admits to pleuritic chest pain - chest xray ordered   -Continue aspirin, Plavix, atorvastatin, metoprolol, and lisinopril  2.  Hypertension   -Somewhat hypotensive; may consider holding metoprolol until BP stabilizes  3.  Elevated white count (5.9 on presentation, most recent 11.3)  -Will recheck CBC  -Chest xray ordered to rule out infectious process     The history, physical exam findings, and plan of care were all discussed with Dr. Bartholome Bill, and all decision making was made in collaboration.   Marvin Jefferson  04/20/2019 8:12 AM   Agree with plan.  Discussed with Dr. [name]

## 2019-04-20 NOTE — Progress Notes (Signed)
Marvin Jefferson to be D/C'd Home per MD. Discussed with the patient and all questions fully answered.   VSS, skin clean, dry and intact without evidence of skin break down, no evidence of skin tears noted.  IV catheters discontinued intact. Site without signs and symptoms of complications. Dressing and pressure applied.   An after visit summary was printed and given to the patient. Patient received prescriptions.   D/c education completed with Mrs Stumph (wife) including follow up instructions, medications list, d/c activities limitations if indicated, with other d/c instructions as indicated by MD- patient able to verbalize understanding, all questions fully answered.   Patient instructed to return to ED, call 911, or Call MD for any changes in condition.  Patient escorted via [facility] and D/C home via private auto.

## 2019-04-20 NOTE — Discharge Summary (Signed)
[place] at [place] NAME: Jermale Crass    MR#:  [id number]  DATE OF BIRTH:  [birth year]  DATE OF ADMISSION:  04/19/2019   ADMITTING PHYSICIAN: Isaias Cowman, MD  DATE OF DISCHARGE: 04/20/2019  PRIMARY CARE PHYSICIAN: Rusty Aus, MD   ADMISSION DIAGNOSIS:  ST elevation myocardial infarction (STEMI), unspecified artery ([place]) [I21.3] STEMI involving oth coronary artery of inferior wall ([place]) [I21.19] DISCHARGE DIAGNOSIS:  Active Problems:   STEMI involving oth coronary artery of inferior wall (HCC)   STEMI (ST elevation myocardial infarction) ([place])  SECONDARY DIAGNOSIS:   Past Medical History:  Diagnosis Date  . Anxiety   . Chronic back pain   . DDD (degenerative disc disease), cervical   . DDD (degenerative disc disease), lumbar   . Dementia ([place])   . Depression   . Hypertension   . Meniere's disease   . Pneumonia 2015  . Umbilical hernia   . UTI (urinary tract infection)    HOSPITAL COURSE:  Chief complaint; code STEMI   History of presenting complaint; Wyat Infinger  is a 78 y.o. Caucasian male with a known history of hypertension, depression, dementia, Mnire's disease and DJD with chronic back pain, who presented to the emergency room with acute onset of midsternal chest pain felt as cramping extending across his chest, graded 8/10 in severity with radiation to his left jaw with associated diaphoresis, dyspnea and palpitations.  His EKG revealed ST segment elevation in the inferior leads as well as lateral leads with reciprocal T wave reversion in anteroseptal leads.  Code STEMI was called and patient was taken to the cardiac cath lab.  Please refer to the H&P for further details  Hospital course; 1. STEMI  Patient presented with complaints of chest pain and diagnosed with STEMI..  Status post cardiac cath with distal RCA 100% occlusion status post PCI and stent. Continue p.o. aspirin and Plavix, statin  therapy. Heparin already discontinued.  Patient on low-dose of beta-blocker and ACE inhibitor.2D echocardiogram done with ejection fraction of 60 to 65%.  Patient significantly improved clinically.  Denies any chest pain this morning.  Follow-up chest x-ray done this morning negative.  No leukocytosis this morning.  Patient wishes to be discharged home.  Cleared for discharge by cardiologist Dr. Genia Plants.  Appointment made for outpatient follow-up.  2. Hypotension.  Resolved Blood pressure stable on current regimen.  Patient on very low dose of ACE inhibitor and beta-blocker. r  3. Hypokalemia. Replaced   4. Depression with anxiety.We will continue his Prozac and PRN Xanax.  5. Peripheral neuropathy. Neurontin will be resumed.  6. Dementia. His galantamine will be continued  Called and updated patient's wife Mrs. Letta Median on treatment and discharge plans for today.  Agrees with plan of care as outlined.  DISCHARGE CONDITIONS:  Stable CONSULTS OBTAINED:  Treatment Team:  Isaias Cowman, MD Teodoro Spray, MD DRUG ALLERGIES:   Allergies  Allergen Reactions  . Clarithromycin Other (See Comments)    Terrible taste and smell   DISCHARGE MEDICATIONS:   Allergies as of 04/20/2019      Reactions   Clarithromycin Other (See Comments)   Terrible taste and smell      Medication List    STOP taking these medications   amLODipine 5 MG tablet Commonly known as: NORVASC   cephALEXin 500 MG capsule Commonly known as: KEFLEX   etodolac 400 MG tablet Commonly known as: LODINE   hydrochlorothiazide 12.5 MG tablet Commonly known  as: HYDRODIURIL   Melatonin 5 MG Caps Replaced by: Melatonin 5 MG Tabs     TAKE these medications   acetaminophen 500 MG tablet Commonly known as: TYLENOL Take 1,000 mg by mouth every 6 (six) hours as needed for mild pain.   ALPRAZolam 0.25 MG tablet Commonly known as: XANAX Take 0.25 mg by mouth 2 (two) times daily as needed  (dizziness).   aspirin 81 MG chewable tablet Chew 1 tablet (81 mg total) by mouth daily. Start taking on: April 21, 2019   atorvastatin 80 MG tablet Commonly known as: LIPITOR Take 1 tablet (80 mg total) by mouth daily at 6 PM.   clopidogrel 75 MG tablet Commonly known as: PLAVIX Take 75 mg by mouth daily.   docusate sodium 100 MG capsule Commonly known as: COLACE Take 100 mg by mouth daily.   FLUoxetine 20 MG capsule Commonly known as: PROZAC Take 20 mg by mouth daily.   fluticasone 50 MCG/ACT nasal spray Commonly known as: FLONASE USE 2 SPRAYS IN EACH NOSTRIL EVERY DAY AS NEEDED FOR ALLERGIES   gabapentin 100 MG capsule Commonly known as: NEURONTIN Take 100 mg by mouth 3 (three) times daily.   galantamine 12 MG tablet Commonly known as: RAZADYNE Take 12 mg by mouth 2 (two) times daily.   hydrocortisone cream 1 % Apply 1 application topically daily as needed for itching.   lactulose 10 GM/15ML solution Commonly known as: CHRONULAC Take 30 mLs (20 g total) by mouth daily as needed for mild constipation.   lisinopril 2.5 MG tablet Commonly known as: ZESTRIL Take 1 tablet (2.5 mg total) by mouth daily. Start taking on: April 21, 2019   meclizine 25 MG tablet Commonly known as: ANTIVERT Take 25 mg by mouth 3 (three) times daily as needed for dizziness.   Melatonin 5 MG Tabs Take 1 tablet (5 mg total) by mouth at bedtime. Replaces: Melatonin 5 MG Caps   memantine 5 MG tablet Commonly known as: NAMENDA Take 5 mg by mouth 2 (two) times a day.   methocarbamol 500 MG tablet Commonly known as: ROBAXIN Take 500 mg by mouth 3 (three) times daily as needed for muscle spasms.   methylphenidate 5 MG tablet Commonly known as: RITALIN Take 1 tablet by mouth 2 (two) times daily.   metoprolol tartrate 25 MG tablet Commonly known as: LOPRESSOR Take 0.5 tablets (12.5 mg total) by mouth 2 (two) times daily.   multivitamin with minerals Tabs tablet Take 1 tablet by mouth  daily. Start taking on: April 21, 2019   neomycin-bacitracin-polymyxin ointment Commonly known as: NEOSPORIN Apply 1 application topically daily as needed for wound care. apply to eye   nystatin cream Commonly known as: MYCOSTATIN Apply 1 application topically 2 (two) times daily.   PEG 3350 17 GM/SCOOP Powd Take 17 g by mouth daily.   rivastigmine 9.5 mg/24hr Commonly known as: EXELON Place 1 patch onto the skin daily.   sodium chloride 0.65 % Soln nasal spray Commonly known as: OCEAN Place 2 sprays into both nostrils 2 (two) times daily as needed for congestion.   terazosin 5 MG capsule Commonly known as: HYTRIN Take 5 mg by mouth 2 (two) times daily.   traMADol 50 MG tablet Commonly known as: ULTRAM Take 50 mg by mouth every 6 (six) hours as needed for moderate pain.   Vitamin D3 50 MCG (2000 UT) capsule Take 2,000 Units by mouth daily.   vitamin E 1000 UNIT capsule Take 1,000 Units by mouth daily.  DISCHARGE INSTRUCTIONS:   DIET:  Cardiac diet DISCHARGE CONDITION:  Stable ACTIVITY:  Activity as tolerated OXYGEN:  Home Oxygen: No.  Oxygen Delivery: room air DISCHARGE LOCATION:  home   If you experience worsening of your admission symptoms, develop shortness of breath, life threatening emergency, suicidal or homicidal thoughts you must seek medical attention immediately by calling 911 or calling your MD immediately  if symptoms less severe.  You Must read complete instructions/literature along with all the possible adverse reactions/side effects for all the Medicines you take and that have been prescribed to you. Take any new Medicines after you have completely understood and accpet all the possible adverse reactions/side effects.   Please note  You were cared for by a hospitalist during your hospital stay. If you have any questions about your discharge medications or the care you received while you were in the hospital after you are discharged, you  can call the unit and asked to speak with the hospitalist on call if the hospitalist that took care of you is not available. Once you are discharged, your primary care physician will handle any further medical issues. Please note that NO REFILLS for any discharge medications will be authorized once you are discharged, as it is imperative that you return to your primary care physician (or establish a relationship with a primary care physician if you do not have one) for your aftercare needs so that they can reassess your need for medications and monitor your lab values.    On the day of Discharge:  VITAL SIGNS:  Blood pressure 115/77, pulse (!) 58, temperature 97.8 F (36.6 C), temperature source Axillary, resp. rate 16, height 5\' 7"  (1.702 m), weight 93.8 kg, SpO2 97 %. PHYSICAL EXAMINATION:  GENERAL:  78 y.o.-year-old patient lying in the bed with no acute distress.  EYES: Pupils equal, round, reactive to light and accommodation. No scleral icterus. Extraocular muscles intact.  HEENT: Head atraumatic, normocephalic. Oropharynx and nasopharynx clear.  NECK:  Supple, no jugular venous distention. No thyroid enlargement, no tenderness.  LUNGS: Normal breath sounds bilaterally, no wheezing, rales,rhonchi or crepitation. No use of accessory muscles of respiration.  CARDIOVASCULAR: S1, S2 normal. No murmurs, rubs, or gallops.  ABDOMEN: Soft, non-tender, non-distended. Bowel sounds present. No organomegaly or mass.  EXTREMITIES: No pedal edema, cyanosis, or clubbing.  NEUROLOGIC: Cranial nerves II through XII are intact. Muscle strength 5/5 in all extremities. Sensation intact. Gait not checked.  PSYCHIATRIC: The patient is alert and oriented x 3.  SKIN: No obvious rash, lesion, or ulcer.  DATA REVIEW:   CBC Recent Labs  Lab 04/20/19 0438  WBC 6.9  HGB 13.2  HCT 38.3*  PLT 155    Chemistries  Recent Labs  Lab 04/19/19 0259 04/19/19 0513 04/20/19 0438  NA 139 138 139  K 3.0* 3.5 3.8   CL 107 107 111  CO2 24 23 24   GLUCOSE 155* 122* 93  BUN 22 22 19   CREATININE 1.01 1.04 0.99  CALCIUM 8.8* 8.1* 8.5*  MG  --  2.4  --   AST 28  --   --   ALT 19  --   --   ALKPHOS 91  --   --   BILITOT 1.0  --   --      Microbiology Results  Results for orders placed or performed during the hospital encounter of 04/19/19  SARS Coronavirus 2 (CEPHEID - Performed in [place] hospital lab), Hosp Order     Status: None  Collection Time: 04/19/19  2:57 AM   Specimen: Nasopharyngeal Swab  Result Value Ref Range Status   SARS Coronavirus 2 NEGATIVE NEGATIVE Final    Comment: (NOTE) If result is NEGATIVE SARS-CoV-2 target nucleic acids are NOT DETECTED. The SARS-CoV-2 RNA is generally detectable in upper and lower  respiratory specimens during the acute phase of infection. The lowest  concentration of SARS-CoV-2 viral copies this assay can detect is 250  copies / mL. A negative result does not preclude SARS-CoV-2 infection  and should not be used as the sole basis for treatment or other  patient management decisions.  A negative result may occur with  improper specimen collection / handling, submission of specimen other  than nasopharyngeal swab, presence of viral mutation(s) within the  areas targeted by this assay, and inadequate number of viral copies  (<250 copies / mL). A negative result must be combined with clinical  observations, patient history, and epidemiological information. If result is POSITIVE SARS-CoV-2 target nucleic acids are DETECTED. The SARS-CoV-2 RNA is generally detectable in upper and lower  respiratory specimens dur ing the acute phase of infection.  Positive  results are indicative of active infection with SARS-CoV-2.  Clinical  correlation with patient history and other diagnostic information is  necessary to determine patient infection status.  Positive results do  not rule out bacterial infection or co-infection with other viruses. If result is  PRESUMPTIVE POSTIVE SARS-CoV-2 nucleic acids MAY BE PRESENT.   A presumptive positive result was obtained on the submitted specimen  and confirmed on repeat testing.  While 2019 novel coronavirus  (SARS-CoV-2) nucleic acids may be present in the submitted sample  additional confirmatory testing may be necessary for epidemiological  and / or clinical management purposes  to differentiate between  SARS-CoV-2 and other Sarbecovirus currently known to infect humans.  If clinically indicated additional testing with an alternate test  methodology [id number]) is advised. The SARS-CoV-2 RNA is generally  detectable in upper and lower respiratory sp ecimens during the acute  phase of infection. The expected result is Negative. Fact Sheet for Patients:  StrictlyIdeas.no Fact Sheet for Healthcare Providers: BankingDealers.co.za This test is not yet approved or cleared by the [place] FDA and has been authorized for detection and/or diagnosis of SARS-CoV-2 by FDA under an Emergency Use Authorization (EUA).  This EUA will remain in effect (meaning this test can be used) for the duration of the COVID-19 declaration under Section 564(b)(1) of the Act, 21 U.S.C. section 360bbb-3(b)(1), unless the authorization is terminated or revoked sooner. Performed at [facility], [address] [place], [place] [address]   MRSA PCR Screening     Status: None   Collection Time: 04/19/19  5:05 AM   Specimen: Nasopharyngeal  Result Value Ref Range Status   MRSA by PCR NEGATIVE NEGATIVE Final    Comment:        The GeneXpert MRSA Assay (FDA approved for NASAL specimens only), is one component of a comprehensive MRSA colonization surveillance program. It is not intended to diagnose MRSA infection nor to guide or monitor treatment for MRSA infections. Performed at [facility], [address] [place], [place] [address]     RADIOLOGY:   Dg Chest 2 View  Result Date: 04/20/2019 CLINICAL DATA:  Left-sided chest pain. EXAM: CHEST - 2 VIEW COMPARISON:  Chest x-ray dated Mar 05, 2018. FINDINGS: The heart size and mediastinal contours are within normal limits. Normal pulmonary vascularity. Mild linear atelectasis at the right lung  base. No focal consolidation, pleural effusion, or pneumothorax. No acute osseous abnormality. IMPRESSION: No active cardiopulmonary disease. Electronically Signed   By: Titus Dubin M.D.   On: 04/20/2019 12:09     Management plans discussed with the patient, family and they are in agreement.  CODE STATUS: Full Code   TOTAL TIME TAKING CARE OF THIS PATIENT: 38 minutes.    Raissa Dam M.D on 04/20/2019 at 3:06 PM  Between 7am to 6pm - Pager - [phone]  After 6pm go to [email or web address] - Proofreader  [facility] [place]  Office  [phone]  CC: Primary care physician; Rusty Aus, MD   Note: This dictation was prepared with Dragon dictation along with smaller phrase technology. Any transcriptional errors that result from this process are unintentional.

## 2019-04-20 NOTE — Discharge Instructions (Signed)
Sexual Activity and Heart Disease Sexual intimacy is an important part of your well-being. After a heart procedure or damaging heart occurrence (cardiac event), you may be worried about being sexually active. If you or your partner have any worries or questions about sexual activity, be sure to discuss them with your health care provider. Most people can continue to have an active sex life. When can I resume sexual activity? How soon it is safe to resume sexual activity--including sex, masturbation, and oral sex--depends on the type of heart procedure or cardiac event that you had. For example:  If you had a heart attack, you may be able to have sex after 2 weeks.  If you had a complicated cardiac event or heart surgery, you may have to wait up to 8 weeks before resuming sexual activity. Ask your health care provider when it is safe for you to resume sexual activity. How do I know when I am ready to resume sexual activity? How soon you are ready to resume sexual activity depends on:  Your physical comfort.  Your mental readiness.  Your sexual habits. Sexual activity involves at least as much energy as climbing two flights of stairs or walking briskly for 20 minutes. It is okay to have sex if you can do these activities without having any of the following problems:  Discomfort in your chest, neck, or arm (angina).  Shortness of breath.  Excessive tiredness. To check whether you are ready to resume sexual activity, your health care provider may have you take an exercise test. This test involves using a treadmill or stationary bicycle while your heart and blood pressure is monitored. The test shows how your heart handles activity. What do I need to know about sexual activity after a cardiac event? Medicines  Certain prescription medicines can affect sexual function. They can decrease your desire for sex, decrease vaginal lubrication, make it hard for you to achieve or maintain an erection,  or make it impossible to achieve an orgasm. If you have any of these problems while taking a medicine, do not stop taking the medicine. Talk with your health care provider about the problem.  Talk with your health care provider before taking any herbal medicines or vitamins. They can interfere with prescription medicines and your heart function.  If you are thinking about starting birth control, discuss it with your health care provider first. This is important.  If you take medicine for sexual dysfunction: ? Avoid medicine such as nitroglycerin or long-acting nitrate medicine for 24 hours. Taking a medicine for sexual dysfunction and a nitrate medicine together can cause a serious drop in blood pressure. Intimacy The stress of your surgery or cardiac event can affect intimacy between you and your partner. When you decide to have sex:  Choose a relaxing atmosphere.  Feel rested and relaxed.  Talk openly and honestly with each other.  Be patient with each other.  Start slowly, and gradually increase intimacy. You can increase intimacy by doing things like caressing, touching, and holding each other. Follow these instructions at home: Lifestyle   Consider participating in a cardiac rehabilitation program or exercising regularly. This can benefit your sex life by building strength and endurance.  Ask your health care provider what exercises are safe for you.  Eat a healthy diet that includes lots of fruits and vegetables, less red meat, and fewer high-fat dairy products. Sexual activity  Avoid having sex after a heavy meal.  Avoid having too much alcohol before sex.  Ask your partner to take a more active role during sex.  If you have angina during sex and are not on a sexual dysfunction drug, stop having sex and take a nitroglycerin as directed by your health care provider. If the angina goes away, you may resume sexual activity. If your symptoms do not go away within 5-10 minutes,  get help right away. Contact a health care provider if:  You are feeling depressed.  You have pain during sex.  You are having difficulty returning to sexual activity. Get help right away if:  You have chest pain or shortness of breath.  You feel dizzy or light-headed during or after sexual activity. Summary  Ask your health care provider when it is safe for you to resume sexual activity.  Consider participating in a cardiac rehabilitation program or exercising regularly. This can benefit your sex life by building strength and endurance.  Start slowly, and gradually increase intimacy. This information is not intended to replace advice given to you by your health care provider. Make sure you discuss any questions you have with your health care provider. Document Released: 07/25/2013 Document Revised: 09/16/2017 Document Reviewed: 07/16/2016 Elsevier Patient Education  2020 [employer].   Heart Attack A heart attack occurs when blood and oxygen supply to the heart is cut off. A heart attack causes damage to the heart that cannot be fixed. A heart attack is also called a myocardial infarction, or MI. If you think you are having a heart attack, do not wait to see if the symptoms will go away. Get medical help right away. What are the causes? This condition may be caused by:  A fatty substance (plaque) in the blood vessels (arteries). This can block the flow of blood to the heart.  A blood clot in the blood vessels that go to the heart. The blood clot blocks blood flow.  Low blood pressure.  An abnormal heartbeat.  Some diseases, such as problems in red blood cells (anemia)orproblems in breathing (respiratory failure).  Tightening (spasm) of a blood vessel that cuts off blood to the heart.  A tear in a blood vessel of the heart.  High blood pressure. What increases the risk? The following factors may make you more likely to develop this condition:  Aging. The older you  are, the higher your risk.  Having a personal or family history of chest pain, heart attack, stroke, or narrowing of the arteries in the legs, arms, head, or stomach (peripheral artery disease).  Being male.  Smoking.  Not getting regular exercise.  Being overweight or obese.  Having high blood pressure.  Having high cholesterol.  Having diabetes.  Drinking too much alcohol.  Using illegal drugs, such as cocaine or methamphetamine. What are the signs or symptoms? Symptoms of this condition include:  Chest pain. It may feel like: ? Crushing or squeezing. ? Tightness, pressure, fullness, or heaviness.  Pain in the arm, neck, jaw, back, or upper body.  Shortness of breath.  Heartburn.  Upset stomach (indigestion).  Feeling like you may vomit (nauseous).  Cold sweats.  Feeling tired.  Sudden light-headedness. How is this treated? A heart attack must be treated as soon as possible. Treatment may include:  Medicines to: ? Break up or dissolve blood clots. ? Thin blood and help prevent blood clots. ? Treat blood pressure. ? Improve blood flow to the heart. ? Reduce pain. ? Reduce cholesterol.  Procedures to widen a blocked artery and keep it open.  Open  heart surgery.  Receiving oxygen.  Making your heart strong again (cardiac rehabilitation) through exercise, education, and counseling. Follow these instructions at home: Medicines  Take over-the-counter and prescription medicines only as told by your doctor. You may need to take medicine: ? To keep your blood from clotting too easily. ? To control blood pressure. ? To lower cholesterol. ? To control heart rhythms.  Do not take these medicines unless your doctor says it is okay: ? NSAIDs, such as ibuprofen. ? Supplements that have vitamin A, vitamin E, or both. ? Hormone replacement therapy that has estrogen with or without progestin. Lifestyle      Do not use any products that have nicotine or  tobacco, such as cigarettes, e-cigarettes, and chewing tobacco. If you need help quitting, ask your doctor.  Avoid secondhand smoke.  Exercise regularly. Ask your doctor about a cardiac rehab program.  Eat heart-healthy foods. Your doctor will tell you what foods to eat.  Stay at a healthy weight.  Lower your stress level.  Do not use illegal drugs. Alcohol use  Do not drink alcohol if: ? Your doctor tells you not to drink. ? You are pregnant, may be pregnant, or are planning to become pregnant.  If you drink alcohol: ? Limit how much you use to:  0-1 drink a day for women.  0-2 drinks a day for men. ? Know how much alcohol is in your drink. In the [place] one drink equals one 12 oz bottle of beer (355 mL), one 5 oz glass of wine (148 mL), or one 1 oz glass of hard liquor (44 mL). General instructions  Work with your doctor to treat other problems you may have, such as diabetes or high blood pressure.  Get screened for depression. Get treatment if needed.  Keep your vaccines up to date. Get the flu shot (influenza vaccine) every year.  Keep all follow-up visits as told by your doctor. This is important. Contact a doctor if:  You feel very sad.  You have trouble doing your daily activities. Get help right away if:  You have sudden, unexplained discomfort in your chest, arms, back, neck, jaw, or upper body.  You have shortness of breath.  You have sudden sweating or clammy skin.  You feel like you may vomit.  You vomit.  You feel tired or weak.  You get light-headed or dizzy.  You feel your heart beating fast.  You feel your heart skipping beats.  You have blood pressure that is higher than 180/120. These symptoms may be an emergency. Do not wait to see if the symptoms will go away. Get medical help right away. Call your local emergency services (911 in the [place] Do not drive yourself to the hospital. Summary  A heart attack occurs when blood and oxygen  supply to the heart is cut off.  Do not take NSAIDs unless your doctor says it is okay.  Do not smoke. Avoid secondhand smoke.  Exercise regularly. Ask your doctor about a cardiac rehab program. This information is not intended to replace advice given to you by your health care provider. Make sure you discuss any questions you have with your health care provider. Document Released: 04/04/2012 Document Revised: 01/15/2019 Document Reviewed: 01/15/2019 Elsevier Patient Education  [employer].

## 2019-05-23 ENCOUNTER — Encounter: Payer: Medicare Other | Attending: Cardiology | Admitting: *Deleted

## 2019-05-23 ENCOUNTER — Other Ambulatory Visit: Payer: Self-pay

## 2019-05-23 ENCOUNTER — Encounter: Payer: Self-pay | Admitting: *Deleted

## 2019-05-23 DIAGNOSIS — F0390 Unspecified dementia without behavioral disturbance: Secondary | ICD-10-CM | POA: Insufficient documentation

## 2019-05-23 DIAGNOSIS — Z7982 Long term (current) use of aspirin: Secondary | ICD-10-CM | POA: Insufficient documentation

## 2019-05-23 DIAGNOSIS — Z7902 Long term (current) use of antithrombotics/antiplatelets: Secondary | ICD-10-CM | POA: Insufficient documentation

## 2019-05-23 DIAGNOSIS — Z79899 Other long term (current) drug therapy: Secondary | ICD-10-CM | POA: Insufficient documentation

## 2019-05-23 DIAGNOSIS — I213 ST elevation (STEMI) myocardial infarction of unspecified site: Secondary | ICD-10-CM | POA: Insufficient documentation

## 2019-05-23 DIAGNOSIS — F329 Major depressive disorder, single episode, unspecified: Secondary | ICD-10-CM | POA: Insufficient documentation

## 2019-05-23 DIAGNOSIS — M549 Dorsalgia, unspecified: Secondary | ICD-10-CM | POA: Insufficient documentation

## 2019-05-23 DIAGNOSIS — G8929 Other chronic pain: Secondary | ICD-10-CM | POA: Insufficient documentation

## 2019-05-23 DIAGNOSIS — I10 Essential (primary) hypertension: Secondary | ICD-10-CM | POA: Insufficient documentation

## 2019-05-23 DIAGNOSIS — Z87891 Personal history of nicotine dependence: Secondary | ICD-10-CM | POA: Insufficient documentation

## 2019-05-23 DIAGNOSIS — F419 Anxiety disorder, unspecified: Secondary | ICD-10-CM | POA: Insufficient documentation

## 2019-05-23 NOTE — Progress Notes (Signed)
Cardiac Rehab Orientation - Virtual visit today

## 2019-05-24 ENCOUNTER — Encounter: Payer: Self-pay | Admitting: *Deleted

## 2019-05-24 DIAGNOSIS — I213 ST elevation (STEMI) myocardial infarction of unspecified site: Secondary | ICD-10-CM

## 2019-05-28 ENCOUNTER — Other Ambulatory Visit: Payer: Self-pay

## 2019-05-28 VITALS — Ht 68.25 in | Wt 204.0 lb

## 2019-05-28 DIAGNOSIS — I213 ST elevation (STEMI) myocardial infarction of unspecified site: Secondary | ICD-10-CM

## 2019-05-28 DIAGNOSIS — Z7982 Long term (current) use of aspirin: Secondary | ICD-10-CM | POA: Diagnosis not present

## 2019-05-28 DIAGNOSIS — M549 Dorsalgia, unspecified: Secondary | ICD-10-CM | POA: Diagnosis not present

## 2019-05-28 DIAGNOSIS — F419 Anxiety disorder, unspecified: Secondary | ICD-10-CM | POA: Diagnosis not present

## 2019-05-28 DIAGNOSIS — F0390 Unspecified dementia without behavioral disturbance: Secondary | ICD-10-CM | POA: Diagnosis not present

## 2019-05-28 DIAGNOSIS — Z7902 Long term (current) use of antithrombotics/antiplatelets: Secondary | ICD-10-CM | POA: Diagnosis not present

## 2019-05-28 DIAGNOSIS — Z87891 Personal history of nicotine dependence: Secondary | ICD-10-CM | POA: Diagnosis not present

## 2019-05-28 DIAGNOSIS — Z79899 Other long term (current) drug therapy: Secondary | ICD-10-CM | POA: Diagnosis not present

## 2019-05-28 DIAGNOSIS — F329 Major depressive disorder, single episode, unspecified: Secondary | ICD-10-CM | POA: Diagnosis not present

## 2019-05-28 DIAGNOSIS — I10 Essential (primary) hypertension: Secondary | ICD-10-CM | POA: Diagnosis not present

## 2019-05-28 DIAGNOSIS — G8929 Other chronic pain: Secondary | ICD-10-CM | POA: Diagnosis not present

## 2019-05-28 NOTE — Progress Notes (Signed)
Cardiac Individual Treatment Plan  Patient Details  Name: Marvin Jefferson MRN: [id number] Date of Birth: 04-17-1941 Referring Provider:     Cardiac Rehab from 05/28/2019 in [facility] Cardiac and Pulmonary Rehab  Referring Provider  Paraschos      Initial Encounter Date:    Cardiac Rehab from 05/28/2019 in [facility] Cardiac and Pulmonary Rehab  Date  05/28/19      Visit Diagnosis: ST elevation myocardial infarction (STEMI), unspecified artery ([place])  Patient's Home Medications on Admission:  Current Outpatient Medications:  .  acetaminophen (TYLENOL) 500 MG tablet, Take 1,000 mg by mouth every 6 (six) hours as needed for mild pain. , Disp: , Rfl:  .  ALPRAZolam (XANAX) 0.25 MG tablet, Take 0.25 mg by mouth 2 (two) times daily as needed (dizziness). , Disp: , Rfl:  .  amLODipine (NORVASC) 5 MG tablet, Take by mouth., Disp: , Rfl:  .  aspirin 81 MG chewable tablet, Chew 1 tablet (81 mg total) by mouth daily., Disp: 30 tablet, Rfl: 0 .  atorvastatin (LIPITOR) 80 MG tablet, Take 1 tablet (80 mg total) by mouth daily at 6 PM., Disp: 30 tablet, Rfl: 0 .  Cholecalciferol (VITAMIN D3) 2000 units capsule, Take 2,000 Units by mouth daily. , Disp: , Rfl:  .  clopidogrel (PLAVIX) 75 MG tablet, Take 75 mg by mouth daily., Disp: , Rfl:  .  docusate sodium (COLACE) 100 MG capsule, Take 100 mg by mouth daily. , Disp: , Rfl:  .  FLUoxetine (PROZAC) 20 MG capsule, Take 20 mg by mouth daily., Disp: , Rfl:  .  fluticasone (FLONASE) 50 MCG/ACT nasal spray, USE 2 SPRAYS IN EACH NOSTRIL EVERY DAY AS NEEDED FOR ALLERGIES, Disp: , Rfl:  .  gabapentin (NEURONTIN) 100 MG capsule, Take 100 mg by mouth 3 (three) times daily., Disp: , Rfl:  .  galantamine (RAZADYNE) 12 MG tablet, Take 12 mg by mouth 2 (two) times daily. , Disp: , Rfl:  .  hydrocortisone cream 1 %, Apply 1 application topically daily as needed for itching., Disp: , Rfl:  .  lactulose (CHRONULAC) 10 GM/15ML solution, Take 30 mLs (20 g total) by mouth daily as  needed for mild constipation. (Patient not taking: Reported on 04/19/2019), Disp: 120 mL, Rfl: 0 .  lisinopril (ZESTRIL) 2.5 MG tablet, Take 1 tablet (2.5 mg total) by mouth daily., Disp: 30 tablet, Rfl: 0 .  meclizine (ANTIVERT) 25 MG tablet, Take 25 mg by mouth 3 (three) times daily as needed for dizziness., Disp: , Rfl:  .  Melatonin 5 MG TABS, Take 1 tablet (5 mg total) by mouth at bedtime. (Patient not taking: Reported on 05/23/2019), Disp: 30 tablet, Rfl: 0 .  memantine (NAMENDA) 5 MG tablet, Take 5 mg by mouth 2 (two) times a day., Disp: , Rfl:  .  methocarbamol (ROBAXIN) 500 MG tablet, Take 500 mg by mouth 3 (three) times daily as needed for muscle spasms., Disp: , Rfl:  .  methylphenidate (RITALIN) 5 MG tablet, Take 1 tablet by mouth 2 (two) times daily., Disp: , Rfl:  .  metoprolol tartrate (LOPRESSOR) 25 MG tablet, Take by mouth., Disp: , Rfl:  .  Multiple Vitamin (MULTIVITAMIN WITH MINERALS) TABS tablet, Take 1 tablet by mouth daily. (Patient not taking: Reported on 05/23/2019), Disp: 30 tablet, Rfl: 0 .  neomycin-bacitracin-polymyxin (NEOSPORIN) ointment, Apply 1 application topically daily as needed for wound care. apply to eye, Disp: , Rfl:  .  nystatin cream (MYCOSTATIN), Apply 1 application topically 2 (two)  times daily. (Patient not taking: Reported on 04/19/2019), Disp: 30 g, Rfl: 0 .  OVER THE COUNTER MEDICATION, Place 0.75 mLs under the tongue at bedtime., Disp: , Rfl:  .  Polyethylene Glycol 3350 (PEG 3350) 17 GM/SCOOP POWD, Take 17 g by mouth daily., Disp: , Rfl:  .  rivastigmine (EXELON) 9.5 mg/24hr, Place 1 patch onto the skin daily., Disp: , Rfl:  .  sodium chloride (OCEAN) 0.65 % SOLN nasal spray, Place 2 sprays into both nostrils 2 (two) times daily as needed for congestion., Disp: , Rfl:  .  terazosin (HYTRIN) 5 MG capsule, Take 5 mg by mouth 2 (two) times daily. , Disp: , Rfl:  .  traMADol (ULTRAM) 50 MG tablet, Take 50 mg by mouth every 6 (six) hours as needed for moderate  pain. , Disp: , Rfl:  .  vitamin E 1000 UNIT capsule, Take 1,000 Units by mouth daily. , Disp: , Rfl:   Past Medical History: Past Medical History:  Diagnosis Date  . Anxiety   . Chronic back pain   . DDD (degenerative disc disease), cervical   . DDD (degenerative disc disease), lumbar   . Dementia ([place])   . Depression   . Hypertension   . Meniere's disease   . Pneumonia 2015  . Umbilical hernia   . UTI (urinary tract infection)     Tobacco Use: Social History   Tobacco Use  Smoking Status Former Smoker  . Quit date: 08/02/1962  . Years since quitting: 56.8  Smokeless Tobacco Never Used    Labs: Recent Review [facility] for [facility] Cardiac and Pulmonary Rehab Latest Ref Rng & Units 04/19/2019   Cholestrol 0 - 200 mg/dL 162   LDLCALC 0 - 99 mg/dL 104(H)   HDL >40 mg/dL 44   Trlycerides <150 mg/dL 70       Exercise Target Goals: Exercise Program Goal: Individual exercise prescription set using results from initial 6 min walk test and THRR while considering  patient's activity barriers and safety.   Exercise Prescription Goal: Initial exercise prescription builds to 30-45 minutes a day of aerobic activity, 2-3 days per week.  Home exercise guidelines will be given to patient during program as part of exercise prescription that the participant will acknowledge.  Activity Barriers & Risk Stratification: Activity Barriers & Cardiac Risk Stratification - 05/23/19 1119      Activity Barriers & Cardiac Risk Stratification   Activity Barriers  Balance Concerns;Back Problems;Other (comment)   DJD Neck and Back, "crippled when wakes up-sits with ice pack for 30 min and is better to move   Comments  Left foot goes to sleep when walks or stands for a long time.   Wll improve after more movement    Cardiac Risk Stratification  Moderate       6 Minute Walk: 6 Minute Walk    Row Name 05/28/19 1314         6 Minute Walk   Phase  Initial     Distance  1532 feet      Walk Time  6 minutes     # of Rest Breaks  0     MPH  2.9     METS  2.7     RPE  7     Perceived Dyspnea   0     VO2 Peak  9.35     Symptoms  No     Resting HR  58 bpm  Resting BP  108/70     Resting Oxygen Saturation   98 %     Exercise Oxygen Saturation  during 6 min walk  94 %     Max Ex. HR  92 bpm     Max Ex. BP  132/66     2 Minute Post BP  114/70        Oxygen Initial Assessment:   Oxygen Re-Evaluation:   Oxygen Discharge (Final Oxygen Re-Evaluation):   Initial Exercise Prescription: Initial Exercise Prescription - 05/28/19 1300      Date of Initial Exercise RX and Referring Provider   Date  05/28/19    Referring Provider  Paraschos      Treadmill   MPH  2.5    Grade  0.5    Minutes  15    METs  3.09      Recumbant Bike   Level  3    RPM  60    Watts  20    Minutes  15    METs  2.7      NuStep   Level  4    SPM  80    Minutes  15    METs  2.7      Elliptical   Level  1    Speed  3    Minutes  15      Biostep-RELP   Level  4    SPM  50    Minutes  15    METs  3      Prescription Details   Frequency (times per week)  3    Duration  Progress to 30 minutes of continuous aerobic without signs/symptoms of physical distress      Intensity   THRR 40-80% of Max Heartrate  92-126    Ratings of Perceived Exertion  11-13    Perceived Dyspnea  0-4      Progression   Progression  Continue to progress workloads to maintain intensity without signs/symptoms of physical distress.      Resistance Training   Training Prescription  Yes    Weight  8 lb    Reps  10-15       Perform Capillary Blood Glucose checks as needed.  Exercise Prescription Changes:   Exercise Comments:   Exercise Goals and Review: Exercise Goals    Row Name 05/28/19 1323             Exercise Goals   Increase Physical Activity  Yes       Expected Outcomes  Short Term: Attend rehab on a regular basis to increase amount of physical activity.;Long Term: Add  in home exercise to make exercise part of routine and to increase amount of physical activity.;Long Term: Exercising regularly at least 3-5 days a week.       Increase Strength and Stamina  Yes       Intervention  Provide advice, education, support and counseling about physical activity/exercise needs.;Develop an individualized exercise prescription for aerobic and resistive training based on initial evaluation findings, risk stratification, comorbidities and participant's personal goals.       Expected Outcomes  Short Term: Increase workloads from initial exercise prescription for resistance, speed, and METs.;Short Term: Perform resistance training exercises routinely during rehab and add in resistance training at home;Long Term: Improve cardiorespiratory fitness, muscular endurance and strength as measured by increased METs and functional capacity (6MWT)       Able to understand and use rate of perceived  exertion (RPE) scale  Yes       Intervention  Provide education and explanation on how to use RPE scale       Expected Outcomes  Short Term: Able to use RPE daily in rehab to express subjective intensity level;Long Term:  Able to use RPE to guide intensity level when exercising independently       Knowledge and understanding of Target Heart Rate Range (THRR)  Yes       Intervention  Provide education and explanation of THRR including how the numbers were predicted and where they are located for reference       Expected Outcomes  Short Term: Able to state/look up THRR;Short Term: Able to use daily as guideline for intensity in rehab;Long Term: Able to use THRR to govern intensity when exercising independently       Able to check pulse independently  Yes       Intervention  Provide education and demonstration on how to check pulse in carotid and radial arteries.;Review the importance of being able to check your own pulse for safety during independent exercise       Expected Outcomes  Short Term: Able to  explain why pulse checking is important during independent exercise;Long Term: Able to check pulse independently and accurately       Understanding of Exercise Prescription  Yes       Intervention  Provide education, explanation, and written materials on patient's individual exercise prescription       Expected Outcomes  Short Term: Able to explain program exercise prescription;Long Term: Able to explain home exercise prescription to exercise independently          Exercise Goals Re-Evaluation :   Discharge Exercise Prescription (Final Exercise Prescription Changes):   Nutrition:  Target Goals: Understanding of nutrition guidelines, daily intake of sodium <1582m, cholesterol <2026m calories 30% from fat and 7% or less from saturated fats, daily to have 5 or more servings of fruits and vegetables.  Biometrics: Pre Biometrics - 05/28/19 1323      Pre Biometrics   Height  5' 8.25" (1.734 m)    Weight  204 lb (92.5 kg)    BMI (Calculated)  30.78    Single Leg Stand  2.4 seconds        Nutrition Therapy Plan and Nutrition Goals: Nutrition Therapy & Goals - 05/28/19 1200      Nutrition Therapy   Diet  low Na, HH diet    Protein (specify units)  75g    Fiber  30 grams    Whole Grain Foods  3 servings    Saturated Fats  12 max. grams    Fruits and Vegetables  5 servings/day    Sodium  1.5 grams      Personal Nutrition Goals   Nutrition Goal  ST: have at least 2 servings of Pro per day and sneak in some more (like soymilk) LT: walk better and get stronger.    Comments  Spoke with pt and wife. B pt has cereal with almond milk and coffee (creamer/splenda). L salad with veggies olive oil and vinegar and some chickpeas (a couple of tbsp) or some peanut butter and fruit or tomato sandwich on homemade bread and dukes mayo or fruit and cottage cheese. D: chicken sald or tuna sald or buger or rice and beans; usually just steamed vegetables and mashed potatoes (smart balance with olive  oil). Pt reports using canola oil in baking. Pt reports not liking meat  too much, discussed other ways to eat consistent protein like switching to soymilk or adding more chickpeas to salad, or when having yogurt and fruit to have greek yogurt which is higher in protein. Pt has been watching Na, chosing low salt options. Discussed HH eating.      Intervention Plan   Intervention  Prescribe, educate and counsel regarding individualized specific dietary modifications aiming towards targeted core components such as weight, hypertension, lipid management, diabetes, heart failure and other comorbidities.;Nutrition handout(s) given to patient.    Expected Outcomes  Short Term Goal: Understand basic principles of dietary content, such as calories, fat, sodium, cholesterol and nutrients.;Short Term Goal: A plan has been developed with personal nutrition goals set during dietitian appointment.;Long Term Goal: Adherence to prescribed nutrition plan.       Nutrition Assessments: Nutrition Assessments - 05/24/19 1053      MEDFICTS Scores   Pre Score  20       Nutrition Goals Re-Evaluation:   Nutrition Goals Discharge (Final Nutrition Goals Re-Evaluation):   Psychosocial: Target Goals: Acknowledge presence or absence of significant depression and/or stress, maximize coping skills, provide positive support system. Participant is able to verbalize types and ability to use techniques and skills needed for reducing stress and depression.   Initial Review & Psychosocial Screening: Initial Psych Review & Screening - 05/23/19 1122      Initial Review   Current issues with  History of Depression;Current Psychotropic Meds    Source of Stress Concerns  Chronic Illness    Comments  Alzihemers  slow progression- Short to term memory is starting to degrade.  ABle to follow directions, may not remeber he was in class after he goes home      [place]?  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      Screening Interventions   Interventions  Encouraged to exercise    Expected Outcomes  Short Term goal: Utilizing psychosocial counselor, staff and physician to assist with identification of specific Stressors or current issues interfering with healing process. Setting desired goal for each stressor or current issue identified.;Long Term Goal: Stressors or current issues are controlled or eliminated.;Short Term goal: Identification and review with participant of any Quality of Life or Depression concerns found by scoring the questionnaire.;Long Term goal: The participant improves quality of Life and PHQ9 Scores as seen by post scores and/or verbalization of changes       Quality of Life Scores:  Quality of Life - 05/24/19 1052      Quality of Life   Select  Quality of Life      Quality of Life Scores   Health/Function Pre  20 %    Socioeconomic Pre  24.31 %    Psych/Spiritual Pre  23.14 %    Family Pre  23.4 %    GLOBAL Pre  22.1 %      Scores of 19 and below usually indicate a poorer quality of life in these areas.  A difference of  2-3 points is a clinically meaningful difference.  A difference of 2-3 points in the total score of the Quality of Life Index has been associated with significant improvement in overall quality of life, self-image, physical symptoms, and general health in studies assessing  change in quality of life.  PHQ-9: Recent Review Flowsheet Data    Depression screen [facility] 2/9 05/28/2019   Decreased Interest 2   Down, Depressed, Hopeless 1   PHQ - 2 Score 3   Altered sleeping 1   Tired, decreased energy 3   Change in appetite 0   Feeling bad or failure about yourself  0   Trouble concentrating 1   Moving slowly or fidgety/restless 0   Suicidal thoughts 0    PHQ-9 Score 8   Difficult doing work/chores Somewhat difficult     Interpretation of Total Score  Total Score Depression Severity:  1-4 = Minimal depression, 5-9 = Mild depression, 10-14 = Moderate depression, 15-19 = Moderately severe depression, 20-27 = Severe depression   Psychosocial Evaluation and Intervention:   Psychosocial Re-Evaluation:   Psychosocial Discharge (Final Psychosocial Re-Evaluation):   Vocational Rehabilitation: Provide vocational rehab assistance to qualifying candidates.   Vocational Rehab Evaluation & Intervention: Vocational Rehab - 05/23/19 1129      Initial Vocational Rehab Evaluation & Intervention   Assessment shows need for Vocational Rehabilitation  No       Education: Education Goals: Education classes will be provided on a variety of topics geared toward better understanding of heart health and risk factor modification. Participant will state understanding/return demonstration of topics presented as noted by education test scores.  Learning Barriers/Preferences: Learning Barriers/Preferences - 05/23/19 1128      Learning Barriers/Preferences   Learning Barriers  --   Memory   Learning Preferences  Individual Instruction;Written Material;Video       Education Topics:  AED/CPR: - Group verbal and written instruction with the use of models to demonstrate the basic use of the AED with the basic ABC's of resuscitation.   General Nutrition Guidelines/Fats and Fiber: -Group instruction provided by verbal, written material, models and posters to present the general guidelines for heart healthy nutrition. Gives an explanation and review of dietary fats and fiber.   Controlling Sodium/Reading Food Labels: -Group verbal and written material supporting the discussion of sodium use in heart healthy nutrition. Review and explanation with models, verbal and written materials for utilization of the food label.   Exercise Physiology & General  Exercise Guidelines: - Group verbal and written instruction with models to review the exercise physiology of the cardiovascular system and associated critical values. Provides general exercise guidelines with specific guidelines to those with heart or lung disease.    Aerobic Exercise & Resistance Training: - Gives group verbal and written instruction on the various components of exercise. Focuses on aerobic and resistive training programs and the benefits of this training and how to safely progress through these programs..   Flexibility, Balance, Mind/Body Relaxation: Provides group verbal/written instruction on the benefits of flexibility and balance training, including mind/body exercise modes such as yoga, pilates and tai chi.  Demonstration and skill practice provided.   Stress and Anxiety: - Provides group verbal and written instruction about the health risks of elevated stress and causes of high stress.  Discuss the correlation between heart/lung disease and anxiety and treatment options. Review healthy ways to manage with stress and anxiety.   Depression: - Provides group verbal and written instruction on the correlation between heart/lung disease and depressed mood, treatment options, and the stigmas associated with seeking treatment.   Anatomy & Physiology of the Heart: - Group verbal and written instruction and models provide basic cardiac anatomy and physiology, with the coronary electrical and arterial systems. Review of Valvular disease  and Heart Failure   Cardiac Procedures: - Group verbal and written instruction to review commonly prescribed medications for heart disease. Reviews the medication, class of the drug, and side effects. Includes the steps to properly store meds and maintain the prescription regimen. (beta blockers and nitrates)   Cardiac Medications I: - Group verbal and written instruction to review commonly prescribed medications for heart disease. Reviews  the medication, class of the drug, and side effects. Includes the steps to properly store meds and maintain the prescription regimen.   Cardiac Medications II: -Group verbal and written instruction to review commonly prescribed medications for heart disease. Reviews the medication, class of the drug, and side effects. (all other drug classes)    Go Sex-Intimacy & Heart Disease, Get SMART - Goal Setting: - Group verbal and written instruction through game format to discuss heart disease and the return to sexual intimacy. Provides group verbal and written material to discuss and apply goal setting through the application of the S.M.A.R.T. Method.   Other Matters of the Heart: - Provides group verbal, written materials and models to describe Stable Angina and Peripheral Artery. Includes description of the disease process and treatment options available to the cardiac patient.   Exercise & Equipment Safety: - Individual verbal instruction and demonstration of equipment use and safety with use of the equipment.   Cardiac Rehab from 05/28/2019 in [facility] Cardiac and Pulmonary Rehab  Date  05/28/19  Educator  AS  Instruction Review Code  1- Verbalizes Understanding      Infection Prevention: - Provides verbal and written material to individual with discussion of infection control including proper hand washing and proper equipment cleaning during exercise session.   Cardiac Rehab from 05/28/2019 in [facility] Cardiac and Pulmonary Rehab  Date  05/28/19  Educator  AS  Instruction Review Code  1- Verbalizes Understanding      Falls Prevention: - Provides verbal and written material to individual with discussion of falls prevention and safety.   Cardiac Rehab from 05/28/2019 in [facility] Cardiac and Pulmonary Rehab  Date  05/28/19  Educator  AS  Instruction Review Code  1- Verbalizes Understanding      Diabetes: - Individual verbal and written instruction to review signs/symptoms of diabetes, desired  ranges of glucose level fasting, after meals and with exercise. Acknowledge that pre and post exercise glucose checks will be done for 3 sessions at entry of program.   Know Your Numbers and Risk Factors: -Group verbal and written instruction about important numbers in your health.  Discussion of what are risk factors and how they play a role in the disease process.  Review of Cholesterol, Blood Pressure, Diabetes, and BMI and the role they play in your overall health.   Sleep Hygiene: -Provides group verbal and written instruction about how sleep can affect your health.  Define sleep hygiene, discuss sleep cycles and impact of sleep habits. Review good sleep hygiene tips.    Other: -Provides group and verbal instruction on various topics (see comments)   Knowledge Questionnaire Score: Knowledge Questionnaire Score - 05/24/19 1052      Knowledge Questionnaire Score   Pre Score  26/26       Core Components/Risk Factors/Patient Goals at Admission: Personal Goals and Risk Factors at Admission - 05/28/19 1324      Core Components/Risk Factors/Patient Goals on Admission    Weight Management  Yes    Admit Weight  204 lb (92.5 kg)    Hypertension  Yes  Intervention  Provide education on lifestyle modifcations including regular physical activity/exercise, weight management, moderate sodium restriction and increased consumption of fresh fruit, vegetables, and low fat dairy, alcohol moderation, and smoking cessation.;Monitor prescription use compliance.    Expected Outcomes  Short Term: Continued assessment and intervention until BP is < 140/45m HG in hypertensive participants. < 130/836mHG in hypertensive participants with diabetes, heart failure or chronic kidney disease.;Long Term: Maintenance of blood pressure at goal levels.    Lipids  Yes    Intervention  Provide education and support for participant on nutrition & aerobic/resistive exercise along with prescribed medications to  achieve LDL <7063mHDL >50m72m  Expected Outcomes  Short Term: Participant states understanding of desired cholesterol values and is compliant with medications prescribed. Participant is following exercise prescription and nutrition guidelines.;Long Term: Cholesterol controlled with medications as prescribed, with individualized exercise RX and with personalized nutrition plan. Value goals: LDL < 70mg58mL > 40 mg.       Core Components/Risk Factors/Patient Goals Review:    Core Components/Risk Factors/Patient Goals at Discharge (Final Review):    ITP Comments: ITP Comments    Row Name 05/23/19 1137 05/28/19 1311         ITP Comments  Virtual CAll for Cardiac Rehab Orientation completed. Has appt on 8/10 for EP/RD Evals and gym orientaiton.  Documentation of diagnosis can be found in [facility]  Completed 6MWT and initial nutrition evaluation         Comments: Initial ITP

## 2019-05-28 NOTE — Patient Instructions (Addendum)
Patient Instructions  Patient Details  Name: Marvin Jefferson MRN: [id number] Date of Birth: [birth year] Referring Provider:  Isaias Cowman, MD  Below are your personal goals for exercise, nutrition, and risk factors. Our goal is to help you stay on track towards obtaining and maintaining these goals. We will be discussing your progress on these goals with you throughout the program.  Initial Exercise Prescription: Initial Exercise Prescription - 05/28/19 1300      Date of Initial Exercise RX and Referring Provider   Date  05/28/19    Referring Provider  Paraschos      Treadmill   MPH  2.5    Grade  0.5    Minutes  15    METs  3.09      Recumbant Bike   Level  3    RPM  60    Watts  20    Minutes  15    METs  2.7      NuStep   Level  4    SPM  80    Minutes  15    METs  2.7      Elliptical   Level  1    Speed  3    Minutes  15      Biostep-RELP   Level  4    SPM  50    Minutes  15    METs  3      Prescription Details   Frequency (times per week)  3    Duration  Progress to 30 minutes of continuous aerobic without signs/symptoms of physical distress      Intensity   THRR 40-80% of Max Heartrate  92-126    Ratings of Perceived Exertion  11-13    Perceived Dyspnea  0-4      Progression   Progression  Continue to progress workloads to maintain intensity without signs/symptoms of physical distress.      Resistance Training   Training Prescription  Yes    Weight  8 lb    Reps  10-15       Exercise Goals: Frequency: Be able to perform aerobic exercise two to three times per week in program working toward 2-5 days per week of home exercise.  Intensity: Work with a perceived exertion of 11 (fairly light) - 15 (hard) while following your exercise prescription.  We will make changes to your prescription with you as you progress through the program.   Duration: Be able to do 30 to 45 minutes of continuous aerobic exercise in addition to a 5 minute  warm-up and a 5 minute cool-down routine.   Nutrition Goals: Your personal nutrition goals will be established when you do your nutrition analysis with the dietician.  The following are general nutrition guidelines to follow: Cholesterol < 200mg /day Sodium < 1500mg /day Fiber: Men over 50 yrs - 30 grams per day  Personal Goals: Personal Goals and Risk Factors at Admission - 05/28/19 1324      Core Components/Risk Factors/Patient Goals on Admission    Weight Management  Yes    Admit Weight  204 lb (92.5 kg)    Hypertension  Yes    Intervention  Provide education on lifestyle modifcations including regular physical activity/exercise, weight management, moderate sodium restriction and increased consumption of fresh fruit, vegetables, and low fat dairy, alcohol moderation, and smoking cessation.;Monitor prescription use compliance.    Expected Outcomes  Short Term: Continued assessment and intervention until BP is < 140/98mm HG in  hypertensive participants. < 130/38mm HG in hypertensive participants with diabetes, heart failure or chronic kidney disease.;Long Term: Maintenance of blood pressure at goal levels.    Lipids  Yes    Intervention  Provide education and support for participant on nutrition & aerobic/resistive exercise along with prescribed medications to achieve LDL 70mg , HDL >40mg .    Expected Outcomes  Short Term: Participant states understanding of desired cholesterol values and is compliant with medications prescribed. Participant is following exercise prescription and nutrition guidelines.;Long Term: Cholesterol controlled with medications as prescribed, with individualized exercise RX and with personalized nutrition plan. Value goals: LDL < 70mg , HDL > 40 mg.       Tobacco Use Initial Evaluation: Social History   Tobacco Use  Smoking Status Former Smoker  . Quit date: 08/02/1962  . Years since quitting: 56.8  Smokeless Tobacco Never Used    Exercise Goals and  Review: Exercise Goals    Row Name 05/28/19 1323             Exercise Goals   Increase Physical Activity  Yes       Expected Outcomes  Short Term: Attend rehab on a regular basis to increase amount of physical activity.;Long Term: Add in home exercise to make exercise part of routine and to increase amount of physical activity.;Long Term: Exercising regularly at least 3-5 days a week.       Increase Strength and Stamina  Yes       Intervention  Provide advice, education, support and counseling about physical activity/exercise needs.;Develop an individualized exercise prescription for aerobic and resistive training based on initial evaluation findings, risk stratification, comorbidities and participant's personal goals.       Expected Outcomes  Short Term: Increase workloads from initial exercise prescription for resistance, speed, and METs.;Short Term: Perform resistance training exercises routinely during rehab and add in resistance training at home;Long Term: Improve cardiorespiratory fitness, muscular endurance and strength as measured by increased METs and functional capacity (6MWT)       Able to understand and use rate of perceived exertion (RPE) scale  Yes       Intervention  Provide education and explanation on how to use RPE scale       Expected Outcomes  Short Term: Able to use RPE daily in rehab to express subjective intensity level;Long Term:  Able to use RPE to guide intensity level when exercising independently       Knowledge and understanding of Target Heart Rate Range (THRR)  Yes       Intervention  Provide education and explanation of THRR including how the numbers were predicted and where they are located for reference       Expected Outcomes  Short Term: Able to state/look up THRR;Short Term: Able to use daily as guideline for intensity in rehab;Long Term: Able to use THRR to govern intensity when exercising independently       Able to check pulse independently  Yes        Intervention  Provide education and demonstration on how to check pulse in carotid and radial arteries.;Review the importance of being able to check your own pulse for safety during independent exercise       Expected Outcomes  Short Term: Able to explain why pulse checking is important during independent exercise;Long Term: Able to check pulse independently and accurately       Understanding of Exercise Prescription  Yes       Intervention  Provide education, explanation, and  written materials on patient's individual exercise prescription       Expected Outcomes  Short Term: Able to explain program exercise prescription;Long Term: Able to explain home exercise prescription to exercise independently          Copy of goals given to participant.

## 2019-05-29 ENCOUNTER — Encounter: Payer: Medicare Other | Admitting: *Deleted

## 2019-05-29 DIAGNOSIS — I213 ST elevation (STEMI) myocardial infarction of unspecified site: Secondary | ICD-10-CM | POA: Diagnosis not present

## 2019-05-29 NOTE — Progress Notes (Signed)
Daily Session Note  Patient Details  Name: Marvin Jefferson MRN: [id number] Date of Birth: [birth year] Referring Provider:     Cardiac Rehab from 05/28/2019 in [facility]  Referring Provider  Paraschos      Encounter Date: 05/29/2019  Check In: Session Check In - 05/29/19 1054      Check-In   Supervising physician immediately available to respond to emergencies  See telemetry face sheet for immediately available ER MD    Location  [facility]    Staff Present  Alberteen Sam, MA, RCEP, CCRP, CCET;Joseph Canton;Heath Lark, RN, BSN, Lance Sell, BA, ACSM CEP, Exercise Physiologist    Virtual Visit  No    Medication changes reported      No    Fall or balance concerns reported     No    Warm-up and Cool-down  Performed on first and last piece of equipment    Resistance Training Performed  Yes    VAD Patient?  No    PAD/SET Patient?  No      Pain Assessment   Currently in Pain?  No/denies          Social History   Tobacco Use  Smoking Status Former Smoker  . Quit date: 08/02/1962  . Years since quitting: 56.8  Smokeless Tobacco Never Used    Goals Met:  Exercise tolerated well Personal goals reviewed No report of cardiac concerns or symptoms Strength training completed today  Goals Unmet:  Not Applicable  Comments: First full day of exercise!  Patient was oriented to gym and equipment including functions, settings, policies, and procedures.  Patient's individual exercise prescription and treatment plan were reviewed.  All starting workloads were established based on the results of the 6 minute walk test done at initial orientation visit.  The plan for exercise progression was also introduced and progression will be customized based on patient's performance and goals.    Dr. Emily Filbert is Medical Director for [place] and [facility].

## 2019-05-30 ENCOUNTER — Encounter: Payer: Self-pay | Admitting: *Deleted

## 2019-05-30 DIAGNOSIS — I213 ST elevation (STEMI) myocardial infarction of unspecified site: Secondary | ICD-10-CM

## 2019-05-30 NOTE — Progress Notes (Signed)
Cardiac Individual Treatment Plan  Patient Details  Name: Marvin Jefferson MRN: [id number] Date of Birth: 04-17-1941 Referring Provider:     Cardiac Rehab from 05/28/2019 in [facility] Cardiac and Pulmonary Rehab  Referring Provider  Paraschos      Initial Encounter Date:    Cardiac Rehab from 05/28/2019 in [facility] Cardiac and Pulmonary Rehab  Date  05/28/19      Visit Diagnosis: ST elevation myocardial infarction (STEMI), unspecified artery ([place])  Patient's Home Medications on Admission:  Current Outpatient Medications:  .  acetaminophen (TYLENOL) 500 MG tablet, Take 1,000 mg by mouth every 6 (six) hours as needed for mild pain. , Disp: , Rfl:  .  ALPRAZolam (XANAX) 0.25 MG tablet, Take 0.25 mg by mouth 2 (two) times daily as needed (dizziness). , Disp: , Rfl:  .  amLODipine (NORVASC) 5 MG tablet, Take by mouth., Disp: , Rfl:  .  aspirin 81 MG chewable tablet, Chew 1 tablet (81 mg total) by mouth daily., Disp: 30 tablet, Rfl: 0 .  atorvastatin (LIPITOR) 80 MG tablet, Take 1 tablet (80 mg total) by mouth daily at 6 PM., Disp: 30 tablet, Rfl: 0 .  Cholecalciferol (VITAMIN D3) 2000 units capsule, Take 2,000 Units by mouth daily. , Disp: , Rfl:  .  clopidogrel (PLAVIX) 75 MG tablet, Take 75 mg by mouth daily., Disp: , Rfl:  .  docusate sodium (COLACE) 100 MG capsule, Take 100 mg by mouth daily. , Disp: , Rfl:  .  FLUoxetine (PROZAC) 20 MG capsule, Take 20 mg by mouth daily., Disp: , Rfl:  .  fluticasone (FLONASE) 50 MCG/ACT nasal spray, USE 2 SPRAYS IN EACH NOSTRIL EVERY DAY AS NEEDED FOR ALLERGIES, Disp: , Rfl:  .  gabapentin (NEURONTIN) 100 MG capsule, Take 100 mg by mouth 3 (three) times daily., Disp: , Rfl:  .  galantamine (RAZADYNE) 12 MG tablet, Take 12 mg by mouth 2 (two) times daily. , Disp: , Rfl:  .  hydrocortisone cream 1 %, Apply 1 application topically daily as needed for itching., Disp: , Rfl:  .  lactulose (CHRONULAC) 10 GM/15ML solution, Take 30 mLs (20 g total) by mouth daily as  needed for mild constipation. (Patient not taking: Reported on 04/19/2019), Disp: 120 mL, Rfl: 0 .  lisinopril (ZESTRIL) 2.5 MG tablet, Take 1 tablet (2.5 mg total) by mouth daily., Disp: 30 tablet, Rfl: 0 .  meclizine (ANTIVERT) 25 MG tablet, Take 25 mg by mouth 3 (three) times daily as needed for dizziness., Disp: , Rfl:  .  Melatonin 5 MG TABS, Take 1 tablet (5 mg total) by mouth at bedtime. (Patient not taking: Reported on 05/23/2019), Disp: 30 tablet, Rfl: 0 .  memantine (NAMENDA) 5 MG tablet, Take 5 mg by mouth 2 (two) times a day., Disp: , Rfl:  .  methocarbamol (ROBAXIN) 500 MG tablet, Take 500 mg by mouth 3 (three) times daily as needed for muscle spasms., Disp: , Rfl:  .  methylphenidate (RITALIN) 5 MG tablet, Take 1 tablet by mouth 2 (two) times daily., Disp: , Rfl:  .  metoprolol tartrate (LOPRESSOR) 25 MG tablet, Take by mouth., Disp: , Rfl:  .  Multiple Vitamin (MULTIVITAMIN WITH MINERALS) TABS tablet, Take 1 tablet by mouth daily. (Patient not taking: Reported on 05/23/2019), Disp: 30 tablet, Rfl: 0 .  neomycin-bacitracin-polymyxin (NEOSPORIN) ointment, Apply 1 application topically daily as needed for wound care. apply to eye, Disp: , Rfl:  .  nystatin cream (MYCOSTATIN), Apply 1 application topically 2 (two)  times daily. (Patient not taking: Reported on 04/19/2019), Disp: 30 g, Rfl: 0 .  OVER THE COUNTER MEDICATION, Place 0.75 mLs under the tongue at bedtime., Disp: , Rfl:  .  Polyethylene Glycol 3350 (PEG 3350) 17 GM/SCOOP POWD, Take 17 g by mouth daily., Disp: , Rfl:  .  rivastigmine (EXELON) 9.5 mg/24hr, Place 1 patch onto the skin daily., Disp: , Rfl:  .  sodium chloride (OCEAN) 0.65 % SOLN nasal spray, Place 2 sprays into both nostrils 2 (two) times daily as needed for congestion., Disp: , Rfl:  .  terazosin (HYTRIN) 5 MG capsule, Take 5 mg by mouth 2 (two) times daily. , Disp: , Rfl:  .  traMADol (ULTRAM) 50 MG tablet, Take 50 mg by mouth every 6 (six) hours as needed for moderate  pain. , Disp: , Rfl:  .  vitamin E 1000 UNIT capsule, Take 1,000 Units by mouth daily. , Disp: , Rfl:   Past Medical History: Past Medical History:  Diagnosis Date  . Anxiety   . Chronic back pain   . DDD (degenerative disc disease), cervical   . DDD (degenerative disc disease), lumbar   . Dementia ([place])   . Depression   . Hypertension   . Meniere's disease   . Pneumonia 2015  . Umbilical hernia   . UTI (urinary tract infection)     Tobacco Use: Social History   Tobacco Use  Smoking Status Former Smoker  . Quit date: 08/02/1962  . Years since quitting: 56.8  Smokeless Tobacco Never Used    Labs: Recent Review [facility] Latest Ref Rng & Units 04/19/2019   Cholestrol 0 - 200 mg/dL 162   LDLCALC 0 - 99 mg/dL 104(H)   HDL >40 mg/dL 44   Trlycerides <150 mg/dL 70       Exercise Target Goals: Exercise Program Goal: Individual exercise prescription set using results from initial 6 min walk test and THRR while considering  patient's activity barriers and safety.   Exercise Prescription Goal: Initial exercise prescription builds to 30-45 minutes a day of aerobic activity, 2-3 days per week.  Home exercise guidelines will be given to patient during program as part of exercise prescription that the participant will acknowledge.  Activity Barriers & Risk Stratification: Activity Barriers & Cardiac Risk Stratification - 05/23/19 1119      Activity Barriers & Cardiac Risk Stratification   Activity Barriers  Balance Concerns;Back Problems;Other (comment)   DJD Neck and Back, "crippled when wakes up-sits with ice pack for 30 min and is better to move   Comments  Left foot goes to sleep when walks or stands for a long time.   Wll improve after more movement    Cardiac Risk Stratification  Moderate       6 Minute Walk: 6 Minute Walk    Row Name 05/28/19 1314         6 Minute Walk   Phase  Initial     Distance  1532 feet      Walk Time  6 minutes     # of Rest Breaks  0     MPH  2.9     METS  2.7     RPE  7     Perceived Dyspnea   0     VO2 Peak  9.35     Symptoms  No     Resting HR  58 bpm  Resting BP  108/70     Resting Oxygen Saturation   98 %     Exercise Oxygen Saturation  during 6 min walk  94 %     Max Ex. HR  92 bpm     Max Ex. BP  132/66     2 Minute Post BP  114/70        Oxygen Initial Assessment:   Oxygen Re-Evaluation:   Oxygen Discharge (Final Oxygen Re-Evaluation):   Initial Exercise Prescription: Initial Exercise Prescription - 05/28/19 1300      Date of Initial Exercise RX and Referring Provider   Date  05/28/19    Referring Provider  Paraschos      Treadmill   MPH  2.5    Grade  0.5    Minutes  15    METs  3.09      Recumbant Bike   Level  3    RPM  60    Watts  20    Minutes  15    METs  2.7      NuStep   Level  4    SPM  80    Minutes  15    METs  2.7      Elliptical   Level  1    Speed  3    Minutes  15      Biostep-RELP   Level  4    SPM  50    Minutes  15    METs  3      Prescription Details   Frequency (times per week)  3    Duration  Progress to 30 minutes of continuous aerobic without signs/symptoms of physical distress      Intensity   THRR 40-80% of Max Heartrate  92-126    Ratings of Perceived Exertion  11-13    Perceived Dyspnea  0-4      Progression   Progression  Continue to progress workloads to maintain intensity without signs/symptoms of physical distress.      Resistance Training   Training Prescription  Yes    Weight  8 lb    Reps  10-15       Perform Capillary Blood Glucose checks as needed.  Exercise Prescription Changes:   Exercise Comments: Exercise Comments    Row Name 05/29/19 1054           Exercise Comments  First full day of exercise!  Patient was oriented to gym and equipment including functions, settings, policies, and procedures.  Patient's individual exercise prescription and treatment  plan were reviewed.  All starting workloads were established based on the results of the 6 minute walk test done at initial orientation visit.  The plan for exercise progression was also introduced and progression will be customized based on patient's performance and goals.          Exercise Goals and Review: Exercise Goals    Row Name 05/28/19 1323             Exercise Goals   Increase Physical Activity  Yes       Expected Outcomes  Short Term: Attend rehab on a regular basis to increase amount of physical activity.;Long Term: Add in home exercise to make exercise part of routine and to increase amount of physical activity.;Long Term: Exercising regularly at least 3-5 days a week.       Increase Strength and Stamina  Yes       Intervention  Provide advice, education, support and counseling about physical activity/exercise needs.;Develop an individualized exercise prescription for aerobic and resistive training based on initial evaluation findings, risk stratification, comorbidities and participant's personal goals.       Expected Outcomes  Short Term: Increase workloads from initial exercise prescription for resistance, speed, and METs.;Short Term: Perform resistance training exercises routinely during rehab and add in resistance training at home;Long Term: Improve cardiorespiratory fitness, muscular endurance and strength as measured by increased METs and functional capacity (6MWT)       Able to understand and use rate of perceived exertion (RPE) scale  Yes       Intervention  Provide education and explanation on how to use RPE scale       Expected Outcomes  Short Term: Able to use RPE daily in rehab to express subjective intensity level;Long Term:  Able to use RPE to guide intensity level when exercising independently       Knowledge and understanding of Target Heart Rate Range (THRR)  Yes       Intervention  Provide education and explanation of THRR including how the numbers were predicted  and where they are located for reference       Expected Outcomes  Short Term: Able to state/look up THRR;Short Term: Able to use daily as guideline for intensity in rehab;Long Term: Able to use THRR to govern intensity when exercising independently       Able to check pulse independently  Yes       Intervention  Provide education and demonstration on how to check pulse in carotid and radial arteries.;Review the importance of being able to check your own pulse for safety during independent exercise       Expected Outcomes  Short Term: Able to explain why pulse checking is important during independent exercise;Long Term: Able to check pulse independently and accurately       Understanding of Exercise Prescription  Yes       Intervention  Provide education, explanation, and written materials on patient's individual exercise prescription       Expected Outcomes  Short Term: Able to explain program exercise prescription;Long Term: Able to explain home exercise prescription to exercise independently          Exercise Goals Re-Evaluation : Exercise Goals Re-Evaluation    Row Name 05/29/19 1054             Exercise Goal Re-Evaluation   Exercise Goals Review  Increase Physical Activity;Increase Strength and Stamina;Able to understand and use rate of perceived exertion (RPE) scale;Able to understand and use Dyspnea scale;Knowledge and understanding of Target Heart Rate Range (THRR);Understanding of Exercise Prescription       Comments  Reviewed RPE scale, THR and program prescription with pt today.  Pt voiced understanding and was given a copy of goals to take home.       Expected Outcomes  Short: Use RPE daily to regulate intensity. Long: Follow program prescription in THR.          Discharge Exercise Prescription (Final Exercise Prescription Changes):   Nutrition:  Target Goals: Understanding of nutrition guidelines, daily intake of sodium '1500mg'$ , cholesterol '200mg'$ , calories 30% from fat and  7% or less from saturated fats, daily to have 5 or more servings of fruits and vegetables.  Biometrics: Pre Biometrics - 05/28/19 1323      Pre Biometrics   Height  5' 8.25" (1.734 m)    Weight  204 lb (92.5 kg)  BMI (Calculated)  30.78    Single Leg Stand  2.4 seconds        Nutrition Therapy Plan and Nutrition Goals: Nutrition Therapy & Goals - 05/28/19 1200      Nutrition Therapy   Diet  low Na, HH diet    Protein (specify units)  75g    Fiber  30 grams    Whole Grain Foods  3 servings    Saturated Fats  12 max. grams    Fruits and Vegetables  5 servings/day    Sodium  1.5 grams      Personal Nutrition Goals   Nutrition Goal  ST: have at least 2 servings of Pro per day and sneak in some more (like soymilk) LT: walk better and get stronger.    Comments  Spoke with pt and wife. B pt has cereal with almond milk and coffee (creamer/splenda). L salad with veggies olive oil and vinegar and some chickpeas (a couple of tbsp) or some peanut butter and fruit or tomato sandwich on homemade bread and dukes mayo or fruit and cottage cheese. D: chicken sald or tuna sald or buger or rice and beans; usually just steamed vegetables and mashed potatoes (smart balance with olive oil). Pt reports using canola oil in baking. Pt reports not liking meat too much, discussed other ways to eat consistent protein like switching to soymilk or adding more chickpeas to salad, or when having yogurt and fruit to have greek yogurt which is higher in protein. Pt has been watching Na, chosing low salt options. Discussed HH eating.      Intervention Plan   Intervention  Prescribe, educate and counsel regarding individualized specific dietary modifications aiming towards targeted core components such as weight, hypertension, lipid management, diabetes, heart failure and other comorbidities.;Nutrition handout(s) given to patient.    Expected Outcomes  Short Term Goal: Understand basic principles of dietary content,  such as calories, fat, sodium, cholesterol and nutrients.;Short Term Goal: A plan has been developed with personal nutrition goals set during dietitian appointment.;Long Term Goal: Adherence to prescribed nutrition plan.       Nutrition Assessments: Nutrition Assessments - 05/24/19 1053      MEDFICTS Scores   Pre Score  20       Nutrition Goals Re-Evaluation:   Nutrition Goals Discharge (Final Nutrition Goals Re-Evaluation):   Psychosocial: Target Goals: Acknowledge presence or absence of significant depression and/or stress, maximize coping skills, provide positive support system. Participant is able to verbalize types and ability to use techniques and skills needed for reducing stress and depression.   Initial Review & Psychosocial Screening: Initial Psych Review & Screening - 05/23/19 1122      Initial Review   Current issues with  History of Depression;Current Psychotropic Meds    Source of Stress Concerns  Chronic Illness    Comments  Alzihemers  slow progression- Short to term memory is starting to degrade.  ABle to follow directions, may not remeber he was in class after he goes home      Garden?  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      Screening Interventions   Interventions  Encouraged to exercise    Expected Outcomes  Short Term goal: Utilizing psychosocial counselor, staff  and physician to assist with identification of specific Stressors or current issues interfering with healing process. Setting desired goal for each stressor or current issue identified.;Long Term Goal: Stressors or current issues are controlled or eliminated.;Short Term goal: Identification and review with participant of any Quality of Life or Depression concerns found by  scoring the questionnaire.;Long Term goal: The participant improves quality of Life and PHQ9 Scores as seen by post scores and/or verbalization of changes       Quality of Life Scores:  Quality of Life - 05/24/19 1052      Quality of Life   Select  Quality of Life      Quality of Life Scores   Health/Function Pre  20 %    Socioeconomic Pre  24.31 %    Psych/Spiritual Pre  23.14 %    Family Pre  23.4 %    GLOBAL Pre  22.1 %      Scores of 19 and below usually indicate a poorer quality of life in these areas.  A difference of  2-3 points is a clinically meaningful difference.  A difference of 2-3 points in the total score of the Quality of Life Index has been associated with significant improvement in overall quality of life, self-image, physical symptoms, and general health in studies assessing change in quality of life.  PHQ-9: Recent Review Flowsheet Data    Depression screen [facility] 2/9 05/28/2019   Decreased Interest 2   Down, Depressed, Hopeless 1   PHQ - 2 Score 3   Altered sleeping 1   Tired, decreased energy 3   Change in appetite 0   Feeling bad or failure about yourself  0   Trouble concentrating 1   Moving slowly or fidgety/restless 0   Suicidal thoughts 0   PHQ-9 Score 8   Difficult doing work/chores Somewhat difficult     Interpretation of Total Score  Total Score Depression Severity:  1-4 = Minimal depression, 5-9 = Mild depression, 10-14 = Moderate depression, 15-19 = Moderately severe depression, 20-27 = Severe depression   Psychosocial Evaluation and Intervention:   Psychosocial Re-Evaluation:   Psychosocial Discharge (Final Psychosocial Re-Evaluation):   Vocational Rehabilitation: Provide vocational rehab assistance to qualifying candidates.   Vocational Rehab Evaluation & Intervention: Vocational Rehab - 05/23/19 1129      Initial Vocational Rehab Evaluation & Intervention   Assessment shows need for Vocational Rehabilitation  No        Education: Education Goals: Education classes will be provided on a variety of topics geared toward better understanding of heart health and risk factor modification. Participant will state understanding/return demonstration of topics presented as noted by education test scores.  Learning Barriers/Preferences: Learning Barriers/Preferences - 05/23/19 1128      Learning Barriers/Preferences   Learning Barriers  --   Memory   Learning Preferences  Individual Instruction;Written Material;Video       Education Topics:  AED/CPR: - Group verbal and written instruction with the use of models to demonstrate the basic use of the AED with the basic ABC's of resuscitation.   General Nutrition Guidelines/Fats and Fiber: -Group instruction provided by verbal, written material, models and posters to present the general guidelines for heart healthy nutrition. Gives an explanation and review of dietary fats and fiber.   Controlling Sodium/Reading Food Labels: -Group verbal and written material supporting the discussion of sodium use in heart healthy nutrition. Review and explanation with models, verbal and written materials for utilization of the food label.  Exercise Physiology & General Exercise Guidelines: - Group verbal and written instruction with models to review the exercise physiology of the cardiovascular system and associated critical values. Provides general exercise guidelines with specific guidelines to those with heart or lung disease.    Aerobic Exercise & Resistance Training: - Gives group verbal and written instruction on the various components of exercise. Focuses on aerobic and resistive training programs and the benefits of this training and how to safely progress through these programs..   Flexibility, Balance, Mind/Body Relaxation: Provides group verbal/written instruction on the benefits of flexibility and balance training, including mind/body exercise modes such as  yoga, pilates and tai chi.  Demonstration and skill practice provided.   Stress and Anxiety: - Provides group verbal and written instruction about the health risks of elevated stress and causes of high stress.  Discuss the correlation between heart/lung disease and anxiety and treatment options. Review healthy ways to manage with stress and anxiety.   Depression: - Provides group verbal and written instruction on the correlation between heart/lung disease and depressed mood, treatment options, and the stigmas associated with seeking treatment.   Anatomy & Physiology of the Heart: - Group verbal and written instruction and models provide basic cardiac anatomy and physiology, with the coronary electrical and arterial systems. Review of Valvular disease and Heart Failure   Cardiac Procedures: - Group verbal and written instruction to review commonly prescribed medications for heart disease. Reviews the medication, class of the drug, and side effects. Includes the steps to properly store meds and maintain the prescription regimen. (beta blockers and nitrates)   Cardiac Medications I: - Group verbal and written instruction to review commonly prescribed medications for heart disease. Reviews the medication, class of the drug, and side effects. Includes the steps to properly store meds and maintain the prescription regimen.   Cardiac Medications II: -Group verbal and written instruction to review commonly prescribed medications for heart disease. Reviews the medication, class of the drug, and side effects. (all other drug classes)    Go Sex-Intimacy & Heart Disease, Get SMART - Goal Setting: - Group verbal and written instruction through game format to discuss heart disease and the return to sexual intimacy. Provides group verbal and written material to discuss and apply goal setting through the application of the S.M.A.R.T. Method.   Other Matters of the Heart: - Provides group verbal,  written materials and models to describe Stable Angina and Peripheral Artery. Includes description of the disease process and treatment options available to the cardiac patient.   Exercise & Equipment Safety: - Individual verbal instruction and demonstration of equipment use and safety with use of the equipment.   Cardiac Rehab from 05/28/2019 in [facility] Cardiac and Pulmonary Rehab  Date  05/28/19  Educator  AS  Instruction Review Code  1- Verbalizes Understanding      Infection Prevention: - Provides verbal and written material to individual with discussion of infection control including proper hand washing and proper equipment cleaning during exercise session.   Cardiac Rehab from 05/28/2019 in [facility] Cardiac and Pulmonary Rehab  Date  05/28/19  Educator  AS  Instruction Review Code  1- Verbalizes Understanding      Falls Prevention: - Provides verbal and written material to individual with discussion of falls prevention and safety.   Cardiac Rehab from 05/28/2019 in [facility] Cardiac and Pulmonary Rehab  Date  05/28/19  Educator  AS  Instruction Review Code  1- Verbalizes Understanding      Diabetes: - Individual  verbal and written instruction to review signs/symptoms of diabetes, desired ranges of glucose level fasting, after meals and with exercise. Acknowledge that pre and post exercise glucose checks will be done for 3 sessions at entry of program.   Know Your Numbers and Risk Factors: -Group verbal and written instruction about important numbers in your health.  Discussion of what are risk factors and how they play a role in the disease process.  Review of Cholesterol, Blood Pressure, Diabetes, and BMI and the role they play in your overall health.   Sleep Hygiene: -Provides group verbal and written instruction about how sleep can affect your health.  Define sleep hygiene, discuss sleep cycles and impact of sleep habits. Review good sleep hygiene tips.    Other: -Provides group  and verbal instruction on various topics (see comments)   Knowledge Questionnaire Score: Knowledge Questionnaire Score - 05/24/19 1052      Knowledge Questionnaire Score   Pre Score  26/26       Core Components/Risk Factors/Patient Goals at Admission: Personal Goals and Risk Factors at Admission - 05/28/19 1324      Core Components/Risk Factors/Patient Goals on Admission    Weight Management  Yes    Admit Weight  204 lb (92.5 kg)    Hypertension  Yes    Intervention  Provide education on lifestyle modifcations including regular physical activity/exercise, weight management, moderate sodium restriction and increased consumption of fresh fruit, vegetables, and low fat dairy, alcohol moderation, and smoking cessation.;Monitor prescription use compliance.    Expected Outcomes  Short Term: Continued assessment and intervention until BP is < 140/81m HG in hypertensive participants. < 130/812mHG in hypertensive participants with diabetes, heart failure or chronic kidney disease.;Long Term: Maintenance of blood pressure at goal levels.    Lipids  Yes    Intervention  Provide education and support for participant on nutrition & aerobic/resistive exercise along with prescribed medications to achieve LDL '70mg'$ , HDL >'40mg'$ .    Expected Outcomes  Short Term: Participant states understanding of desired cholesterol values and is compliant with medications prescribed. Participant is following exercise prescription and nutrition guidelines.;Long Term: Cholesterol controlled with medications as prescribed, with individualized exercise RX and with personalized nutrition plan. Value goals: LDL < '70mg'$ , HDL > 40 mg.       Core Components/Risk Factors/Patient Goals Review:    Core Components/Risk Factors/Patient Goals at Discharge (Final Review):    ITP Comments: ITP Comments    Row Name 05/23/19 1137 05/28/19 1311 05/30/19 0554       ITP Comments  Virtual CAll for Cardiac Rehab Orientation completed.  Has appt on 8/10 for EP/RD Evals and gym orientaiton.  Documentation of diagnosis [facility] found [facility]  Completed 6MWT and initial nutrition evaluation  30 Day Review Completed today. Continue with ITP unless changed by Medical Director review.   New to program        Comments:

## 2019-05-31 ENCOUNTER — Other Ambulatory Visit: Payer: Self-pay

## 2019-05-31 ENCOUNTER — Encounter: Payer: Medicare Other | Admitting: *Deleted

## 2019-05-31 DIAGNOSIS — I213 ST elevation (STEMI) myocardial infarction of unspecified site: Secondary | ICD-10-CM | POA: Diagnosis not present

## 2019-05-31 NOTE — Progress Notes (Signed)
Daily Session Note  Patient Details  Name: Marvin Jefferson MRN: [id number] Date of Birth: April 30, 1941 Referring Provider:     Cardiac Rehab from 05/28/2019 in [facility] Cardiac and Pulmonary Rehab  Referring Provider  Paraschos      Encounter Date: 05/31/2019  Check In: Session Check In - 05/31/19 1043      Check-In   Supervising physician immediately available to respond to emergencies  See telemetry face sheet for immediately available ER MD    Location  [facility]-Cardiac & Pulmonary Rehab    Staff Present  Carson Myrtle, BS, RRT, CPFT;Meredith Sherryll Burger, RN Vickki Hearing, BA, ACSM CEP, Exercise Physiologist    Virtual Visit  No    Medication changes reported      No    Warm-up and Cool-down  Performed on first and last piece of equipment    Resistance Training Performed  Yes    VAD Patient?  No    PAD/SET Patient?  No      Pain Assessment   Currently in Pain?  No/denies          Social History   Tobacco Use  Smoking Status Former Smoker  . Quit date: 08/02/1962  . Years since quitting: 56.8  Smokeless Tobacco Never Used    Goals Met:  Independence with exercise equipment Exercise tolerated well No report of cardiac concerns or symptoms Strength training completed today  Goals Unmet:  Not Applicable  Comments: Pt able to follow exercise prescription today without complaint.  Will continue to monitor for progression.    Dr. Emily Filbert is Medical Director for [place] and [facility].

## 2019-06-01 ENCOUNTER — Encounter: Payer: Medicare Other | Admitting: *Deleted

## 2019-06-01 DIAGNOSIS — I213 ST elevation (STEMI) myocardial infarction of unspecified site: Secondary | ICD-10-CM

## 2019-06-01 NOTE — Progress Notes (Signed)
Daily Session Note  Patient Details  Name: Marvin Jefferson MRN: [id number] Date of Birth: 1940/10/22 Referring Provider:     Cardiac Rehab from 05/28/2019 in [facility] Cardiac and Pulmonary Rehab  Referring Provider  Paraschos      Encounter Date: 06/01/2019  Check In: Session Check In - 06/01/19 1046      Check-In   Supervising physician immediately available to respond to emergencies  See telemetry face sheet for immediately available ER MD    Location  [facility]-Cardiac & Pulmonary Rehab    Staff Present  Renita Papa, RN Vickki Hearing, BA, ACSM CEP, Exercise Physiologist;Joseph Tessie Fass RCP,RRT,BSRT    Virtual Visit  No    Medication changes reported      No    Fall or balance concerns reported     No    Warm-up and Cool-down  Performed on first and last piece of equipment    Resistance Training Performed  Yes    VAD Patient?  No    PAD/SET Patient?  No      Pain Assessment   Currently in Pain?  No/denies          Social History   Tobacco Use  Smoking Status Former Smoker  . Quit date: 08/02/1962  . Years since quitting: 56.8  Smokeless Tobacco Never Used    Goals Met:  Independence with exercise equipment Exercise tolerated well No report of cardiac concerns or symptoms Strength training completed today  Goals Unmet:  Not Applicable  Comments: Pt able to follow exercise prescription today without complaint.  Will continue to monitor for progression.    Dr. Emily Filbert is Medical Director for [place] and [facility].

## 2019-06-05 ENCOUNTER — Other Ambulatory Visit: Payer: Self-pay

## 2019-06-05 DIAGNOSIS — I213 ST elevation (STEMI) myocardial infarction of unspecified site: Secondary | ICD-10-CM | POA: Diagnosis not present

## 2019-06-05 NOTE — Progress Notes (Signed)
Daily Session Note  Patient Details  Name: Marvin Jefferson MRN: [id number] Date of Birth: 07-16-1941 Referring Provider:     Cardiac Rehab from 05/28/2019 in [facility] Cardiac and Pulmonary Rehab  Referring Provider  Paraschos      Encounter Date: 06/05/2019  Check In:      Social History   Tobacco Use  Smoking Status Former Smoker  . Quit date: 08/02/1962  . Years since quitting: 56.8  Smokeless Tobacco Never Used    Goals Met:  Independence with exercise equipment Exercise tolerated well No report of cardiac concerns or symptoms Strength training completed today  Goals Unmet:  Not Applicable  Comments: Pt able to follow exercise prescription today without complaint.  Will continue to monitor for progression.    Dr. Emily Filbert is Medical Director for [place] and [facility].

## 2019-06-07 ENCOUNTER — Other Ambulatory Visit: Payer: Self-pay

## 2019-06-07 ENCOUNTER — Encounter: Payer: Medicare Other | Admitting: *Deleted

## 2019-06-07 DIAGNOSIS — I213 ST elevation (STEMI) myocardial infarction of unspecified site: Secondary | ICD-10-CM

## 2019-06-07 NOTE — Progress Notes (Signed)
Daily Session Note  Patient Details  Name: Marvin Jefferson MRN: [id number] Date of Birth: 12/07/1940 Referring Provider:     Cardiac Rehab from 05/28/2019 in [facility] Cardiac and Pulmonary Rehab  Referring Provider  Paraschos      Encounter Date: 06/07/2019  Check In: Session Check In - 06/07/19 1046      Check-In   Supervising physician immediately available to respond to emergencies  See telemetry face sheet for immediately available ER MD    Location  [facility]-Cardiac & Pulmonary Rehab    Staff Present  Heath Lark, RN, BSN, CCRP;Jeanna Durrell BS, Exercise Physiologist;Amanda Oletta Darter, BA, ACSM CEP, Exercise Physiologist    Virtual Visit  No    Medication changes reported      No    Fall or balance concerns reported     No    Warm-up and Cool-down  Performed on first and last piece of equipment    Resistance Training Performed  Yes    VAD Patient?  No    PAD/SET Patient?  No      Pain Assessment   Currently in Pain?  No/denies          Social History   Tobacco Use  Smoking Status Former Smoker  . Quit date: 08/02/1962  . Years since quitting: 56.8  Smokeless Tobacco Never Used    Goals Met:  Independence with exercise equipment Exercise tolerated well No report of cardiac concerns or symptoms Strength training completed today  Goals Unmet:  Not Applicable  Comments: Pt able to follow exercise prescription today without complaint.  Will continue to monitor for progression.  Med stopped reported and documented    Dr. Emily Filbert is Medical Director for [place] and [facility].

## 2019-06-08 ENCOUNTER — Encounter: Payer: Medicare Other | Admitting: *Deleted

## 2019-06-08 DIAGNOSIS — I213 ST elevation (STEMI) myocardial infarction of unspecified site: Secondary | ICD-10-CM | POA: Diagnosis not present

## 2019-06-08 NOTE — Progress Notes (Signed)
Daily Session Note  Patient Details  Name: Marvin Jefferson MRN: [id number] Date of Birth: 12/21/1940 Referring Provider:     Cardiac Rehab from 05/28/2019 in [facility] Cardiac and Pulmonary Rehab  Referring Provider  Paraschos      Encounter Date: 06/08/2019  Check In: Session Check In - 06/08/19 1051      Check-In   Supervising physician immediately available to respond to emergencies  See telemetry face sheet for immediately available ER MD    Location  [facility]-Cardiac & Pulmonary Rehab    Staff Present  Susanne Bice, RN, BSN, CCRP;Jessica Hawkins, MA, RCEP, CCRP, CCET    Virtual Visit  No    Medication changes reported      No    Fall or balance concerns reported     No    Warm-up and Cool-down  Performed on first and last piece of equipment    Resistance Training Performed  Yes    VAD Patient?  No    PAD/SET Patient?  No      Pain Assessment   Currently in Pain?  No/denies          Social History   Tobacco Use  Smoking Status Former Smoker  . Quit date: 08/02/1962  . Years since quitting: 56.8  Smokeless Tobacco Never Used    Goals Met:  Independence with exercise equipment Exercise tolerated well No report of cardiac concerns or symptoms Strength training completed today  Goals Unmet:  Not Applicable  Comments: Pt able to follow exercise prescription today without complaint.  Will continue to monitor for progression.    Dr. Mark Miller is Medical Director for [facility] and [facility]. 

## 2019-06-14 ENCOUNTER — Other Ambulatory Visit: Payer: Self-pay

## 2019-06-14 DIAGNOSIS — I213 ST elevation (STEMI) myocardial infarction of unspecified site: Secondary | ICD-10-CM | POA: Diagnosis not present

## 2019-06-14 NOTE — Progress Notes (Signed)
Daily Session Note  Patient Details  Name: ERSEL ENSLIN MRN: [id number] Date of Birth: [birth year] Referring Provider:     Cardiac Rehab from 05/28/2019 in [facility] Cardiac and Pulmonary Rehab  Referring Provider  Paraschos      Encounter Date: 06/14/2019  Check In: Session Check In - 06/14/19 1117      Check-In   Supervising physician immediately available to respond to emergencies  See telemetry face sheet for immediately available ER MD    Location  [facility]-Cardiac & Pulmonary Rehab    Staff Present  Jasper Loser BS, Exercise Physiologist;Leslie Castrejon RN, Vickki Hearing, BA, ACSM CEP, Exercise Physiologist    Virtual Visit  No    Medication changes reported      No    Fall or balance concerns reported     No    Warm-up and Cool-down  Performed on first and last piece of equipment    Resistance Training Performed  Yes    VAD Patient?  No    PAD/SET Patient?  No      Pain Assessment   Currently in Pain?  No/denies          Social History   Tobacco Use  Smoking Status Former Smoker  . Quit date: 08/02/1962  . Years since quitting: 56.9  Smokeless Tobacco Never Used    Goals Met:  Independence with exercise equipment Exercise tolerated well Personal goals reviewed No report of cardiac concerns or symptoms Strength training completed today  Goals Unmet:  Not Applicable  Comments: Pt able to follow exercise prescription today without complaint.  Will continue to monitor for progression.  Reviewed home exercise with pt today.  Pt plans to walking and using weights at home for exercise.  He also belongs  [employer] once it reopens. Reviewed THR, pulse, RPE, sign and symptoms, NTG use, and when to call 911 or MD.  Also discussed weather considerations and indoor options.  Pt voiced understanding.     Dr. Emily Filbert is Medical Director for [place] and [facility].

## 2019-06-15 DIAGNOSIS — I213 ST elevation (STEMI) myocardial infarction of unspecified site: Secondary | ICD-10-CM

## 2019-06-15 NOTE — Progress Notes (Signed)
Daily Session Note  Patient Details  Name: Marvin Jefferson MRN: [id number] Date of Birth: Nov 21, 1940 Referring Provider:     Cardiac Rehab from 05/28/2019 in [facility] Cardiac and Pulmonary Rehab  Referring Provider  Paraschos      Encounter Date: 06/15/2019  Check In: Session Check In - 06/15/19 1044      Check-In   Supervising physician immediately available to respond to emergencies  See telemetry face sheet for immediately available ER MD    Location  [facility]-Cardiac & Pulmonary Rehab    Staff Present  Vida Rigger RN, Vickki Hearing, BA, ACSM CEP, Exercise Physiologist;Jessica Theodore, MA, RCEP, CCRP, CCET    Virtual Visit  No    Medication changes reported      No    Fall or balance concerns reported     No    Warm-up and Cool-down  Performed on first and last piece of equipment    Resistance Training Performed  Yes    VAD Patient?  No    PAD/SET Patient?  No      Pain Assessment   Currently in Pain?  No/denies    Multiple Pain Sites  No          Social History   Tobacco Use  Smoking Status Former Smoker  . Quit date: 08/02/1962  . Years since quitting: 56.9  Smokeless Tobacco Never Used    Goals Met:  Independence with exercise equipment Exercise tolerated well No report of cardiac concerns or symptoms Strength training completed today  Goals Unmet:  Not Applicable  Comments: Pt able to follow exercise prescription today without complaint.  Will continue to monitor for progression.   Dr. Emily Filbert is Medical Director for [place] and [facility].

## 2019-06-19 ENCOUNTER — Other Ambulatory Visit: Payer: Self-pay

## 2019-06-19 ENCOUNTER — Encounter: Payer: Medicare Other | Attending: Cardiology

## 2019-06-19 DIAGNOSIS — M549 Dorsalgia, unspecified: Secondary | ICD-10-CM | POA: Diagnosis not present

## 2019-06-19 DIAGNOSIS — F329 Major depressive disorder, single episode, unspecified: Secondary | ICD-10-CM | POA: Diagnosis not present

## 2019-06-19 DIAGNOSIS — I10 Essential (primary) hypertension: Secondary | ICD-10-CM | POA: Diagnosis not present

## 2019-06-19 DIAGNOSIS — G8929 Other chronic pain: Secondary | ICD-10-CM | POA: Diagnosis not present

## 2019-06-19 DIAGNOSIS — I213 ST elevation (STEMI) myocardial infarction of unspecified site: Secondary | ICD-10-CM | POA: Insufficient documentation

## 2019-06-19 DIAGNOSIS — Z7982 Long term (current) use of aspirin: Secondary | ICD-10-CM | POA: Diagnosis not present

## 2019-06-19 DIAGNOSIS — Z7902 Long term (current) use of antithrombotics/antiplatelets: Secondary | ICD-10-CM | POA: Diagnosis not present

## 2019-06-19 DIAGNOSIS — F0390 Unspecified dementia without behavioral disturbance: Secondary | ICD-10-CM | POA: Diagnosis not present

## 2019-06-19 DIAGNOSIS — F419 Anxiety disorder, unspecified: Secondary | ICD-10-CM | POA: Diagnosis not present

## 2019-06-19 DIAGNOSIS — Z79899 Other long term (current) drug therapy: Secondary | ICD-10-CM | POA: Diagnosis not present

## 2019-06-19 DIAGNOSIS — Z87891 Personal history of nicotine dependence: Secondary | ICD-10-CM | POA: Insufficient documentation

## 2019-06-19 NOTE — Progress Notes (Signed)
Daily Session Note  Patient Details  Name: Marvin Jefferson MRN: [id number] Date of Birth: Oct 19, 1940 Referring Provider:     Cardiac Rehab from 05/28/2019 in [facility] Cardiac and Pulmonary Rehab  Referring Provider  Paraschos      Encounter Date: 06/19/2019  Check In:      Social History   Tobacco Use  Smoking Status Former Smoker  . Quit date: 08/02/1962  . Years since quitting: 56.9  Smokeless Tobacco Never Used    Goals Met:  Independence with exercise equipment Exercise tolerated well No report of cardiac concerns or symptoms Strength training completed today  Goals Unmet:  Not Applicable  Comments: Pt able to follow exercise prescription today without complaint.  Will continue to monitor for progression.    Dr. Emily Filbert is Medical Director for [place] and [facility].

## 2019-06-21 ENCOUNTER — Encounter: Payer: Medicare Other | Admitting: *Deleted

## 2019-06-21 ENCOUNTER — Other Ambulatory Visit: Payer: Self-pay

## 2019-06-21 DIAGNOSIS — I213 ST elevation (STEMI) myocardial infarction of unspecified site: Secondary | ICD-10-CM | POA: Diagnosis not present

## 2019-06-21 NOTE — Progress Notes (Signed)
Daily Session Note  Patient Details  Name: Marvin Jefferson MRN: [id number] Date of Birth: 09/22/1941 Referring Provider:     Cardiac Rehab from 05/28/2019 in [facility] Cardiac and Pulmonary Rehab  Referring Provider  Paraschos      Encounter Date: 06/21/2019  Check In: Session Check In - 06/21/19 1159      Check-In   Supervising physician immediately available to respond to emergencies  See telemetry face sheet for immediately available ER MD    Location  [facility]-Cardiac & Pulmonary Rehab    Staff Present  Heath Lark, RN, BSN, CCRP;Jeanna Durrell BS, Exercise Physiologist;Amanda Oletta Darter, BA, ACSM CEP, Exercise Physiologist    Virtual Visit  No    Medication changes reported      No    Fall or balance concerns reported     No    Warm-up and Cool-down  Performed on first and last piece of equipment    Resistance Training Performed  Yes    VAD Patient?  No    PAD/SET Patient?  No      Pain Assessment   Currently in Pain?  No/denies          Social History   Tobacco Use  Smoking Status Former Smoker  . Quit date: 08/02/1962  . Years since quitting: 56.9  Smokeless Tobacco Never Used    Goals Met:  Independence with exercise equipment Exercise tolerated well No report of cardiac concerns or symptoms Strength training completed today  Goals Unmet:  Not Applicable  Comments: Pt able to follow exercise prescription today without complaint.  Will continue to monitor for progression.    Dr. Emily Filbert is Medical Director for [place] and [facility].

## 2019-06-22 DIAGNOSIS — I213 ST elevation (STEMI) myocardial infarction of unspecified site: Secondary | ICD-10-CM

## 2019-06-22 NOTE — Progress Notes (Signed)
Daily Session Note  Patient Details  Name: Marvin Jefferson MRN: [id number] Date of Birth: [birth year] Referring Provider:     Cardiac Rehab from 05/28/2019 in [facility] Cardiac and Pulmonary Rehab  Referring Provider  Paraschos      Encounter Date: 06/22/2019  Check In: Session Check In - 06/22/19 1050      Check-In   Supervising physician immediately available to respond to emergencies  See telemetry face sheet for immediately available ER MD    Location  [facility]-Cardiac & Pulmonary Rehab    Staff Present  Vida Rigger RN, BSN;Jessica Luan Pulling, MA, RCEP, CCRP, CCET;Joseph Hood RCP,RRT,BSRT    Virtual Visit  No    Medication changes reported      No    Fall or balance concerns reported     No    Warm-up and Cool-down  Performed on first and last piece of equipment    Resistance Training Performed  Yes    VAD Patient?  No    PAD/SET Patient?  No      Pain Assessment   Currently in Pain?  No/denies    Multiple Pain Sites  No          Social History   Tobacco Use  Smoking Status Former Smoker  . Quit date: 08/02/1962  . Years since quitting: 56.9  Smokeless Tobacco Never Used    Goals Met:  Independence with exercise equipment Exercise tolerated well No report of cardiac concerns or symptoms Strength training completed today  Goals Unmet:  Not Applicable  Comments: Pt able to follow exercise prescription today without complaint.  Will continue to monitor for progression.    Dr. Emily Filbert is Medical Director for [place] and [facility].

## 2019-06-26 ENCOUNTER — Encounter: Payer: Medicare Other | Admitting: *Deleted

## 2019-06-26 ENCOUNTER — Other Ambulatory Visit: Payer: Self-pay

## 2019-06-26 DIAGNOSIS — I213 ST elevation (STEMI) myocardial infarction of unspecified site: Secondary | ICD-10-CM

## 2019-06-26 NOTE — Progress Notes (Signed)
Daily Session Note  Patient Details  Name: Marvin Jefferson MRN: [id number] Date of Birth: September 26, 1941 Referring Provider:     Cardiac Rehab from 05/28/2019 in [facility] Cardiac and Pulmonary Rehab  Referring Provider  Paraschos      Encounter Date: 06/26/2019  Check In: Session Check In - 06/26/19 1054      Check-In   Supervising physician immediately available to respond to emergencies  See telemetry face sheet for immediately available ER MD    Location  [facility]-Cardiac & Pulmonary Rehab    Staff Present  Heath Lark, RN, BSN, CCRP;Joseph Hood RCP,RRT,BSRT;Amanda [place], [place], ACSM CEP, Exercise Physiologist    Virtual Visit  No    Medication changes reported      No    Fall or balance concerns reported     No    Warm-up and Cool-down  Performed on first and last piece of equipment    Resistance Training Performed  Yes    VAD Patient?  No    PAD/SET Patient?  No      Pain Assessment   Currently in Pain?  No/denies          Social History   Tobacco Use  Smoking Status Former Smoker  . Quit date: 08/02/1962  . Years since quitting: 56.9  Smokeless Tobacco Never Used    Goals Met:  Independence with exercise equipment Exercise tolerated well No report of cardiac concerns or symptoms Strength training completed today  Goals Unmet:  Not Applicable  Comments: Pt able to follow exercise prescription today without complaint.  Will continue to monitor for progression.    Dr. Emily Filbert is Medical Director for [place] and [facility].

## 2019-06-27 ENCOUNTER — Encounter: Payer: Self-pay | Admitting: *Deleted

## 2019-06-27 DIAGNOSIS — I213 ST elevation (STEMI) myocardial infarction of unspecified site: Secondary | ICD-10-CM

## 2019-06-27 NOTE — Progress Notes (Signed)
Cardiac Individual Treatment Plan  Patient Details  Name: Marvin Jefferson MRN: [id number] Date of Birth: 04-17-1941 Referring Provider:     Cardiac Rehab from 05/28/2019 in [facility] Cardiac and Pulmonary Rehab  Referring Provider  Paraschos      Initial Encounter Date:    Cardiac Rehab from 05/28/2019 in [facility] Cardiac and Pulmonary Rehab  Date  05/28/19      Visit Diagnosis: ST elevation myocardial infarction (STEMI), unspecified artery ([place])  Patient's Home Medications on Admission:  Current Outpatient Medications:  .  acetaminophen (TYLENOL) 500 MG tablet, Take 1,000 mg by mouth every 6 (six) hours as needed for mild pain. , Disp: , Rfl:  .  ALPRAZolam (XANAX) 0.25 MG tablet, Take 0.25 mg by mouth 2 (two) times daily as needed (dizziness). , Disp: , Rfl:  .  amLODipine (NORVASC) 5 MG tablet, Take by mouth., Disp: , Rfl:  .  aspirin 81 MG chewable tablet, Chew 1 tablet (81 mg total) by mouth daily., Disp: 30 tablet, Rfl: 0 .  atorvastatin (LIPITOR) 80 MG tablet, Take 1 tablet (80 mg total) by mouth daily at 6 PM., Disp: 30 tablet, Rfl: 0 .  Cholecalciferol (VITAMIN D3) 2000 units capsule, Take 2,000 Units by mouth daily. , Disp: , Rfl:  .  clopidogrel (PLAVIX) 75 MG tablet, Take 75 mg by mouth daily., Disp: , Rfl:  .  docusate sodium (COLACE) 100 MG capsule, Take 100 mg by mouth daily. , Disp: , Rfl:  .  FLUoxetine (PROZAC) 20 MG capsule, Take 20 mg by mouth daily., Disp: , Rfl:  .  fluticasone (FLONASE) 50 MCG/ACT nasal spray, USE 2 SPRAYS IN EACH NOSTRIL EVERY DAY AS NEEDED FOR ALLERGIES, Disp: , Rfl:  .  gabapentin (NEURONTIN) 100 MG capsule, Take 100 mg by mouth 3 (three) times daily., Disp: , Rfl:  .  galantamine (RAZADYNE) 12 MG tablet, Take 12 mg by mouth 2 (two) times daily. , Disp: , Rfl:  .  hydrocortisone cream 1 %, Apply 1 application topically daily as needed for itching., Disp: , Rfl:  .  lactulose (CHRONULAC) 10 GM/15ML solution, Take 30 mLs (20 g total) by mouth daily as  needed for mild constipation. (Patient not taking: Reported on 04/19/2019), Disp: 120 mL, Rfl: 0 .  lisinopril (ZESTRIL) 2.5 MG tablet, Take 1 tablet (2.5 mg total) by mouth daily., Disp: 30 tablet, Rfl: 0 .  meclizine (ANTIVERT) 25 MG tablet, Take 25 mg by mouth 3 (three) times daily as needed for dizziness., Disp: , Rfl:  .  Melatonin 5 MG TABS, Take 1 tablet (5 mg total) by mouth at bedtime. (Patient not taking: Reported on 05/23/2019), Disp: 30 tablet, Rfl: 0 .  memantine (NAMENDA) 5 MG tablet, Take 5 mg by mouth 2 (two) times a day., Disp: , Rfl:  .  methocarbamol (ROBAXIN) 500 MG tablet, Take 500 mg by mouth 3 (three) times daily as needed for muscle spasms., Disp: , Rfl:  .  methylphenidate (RITALIN) 5 MG tablet, Take 1 tablet by mouth 2 (two) times daily., Disp: , Rfl:  .  metoprolol tartrate (LOPRESSOR) 25 MG tablet, Take by mouth., Disp: , Rfl:  .  Multiple Vitamin (MULTIVITAMIN WITH MINERALS) TABS tablet, Take 1 tablet by mouth daily. (Patient not taking: Reported on 05/23/2019), Disp: 30 tablet, Rfl: 0 .  neomycin-bacitracin-polymyxin (NEOSPORIN) ointment, Apply 1 application topically daily as needed for wound care. apply to eye, Disp: , Rfl:  .  nystatin cream (MYCOSTATIN), Apply 1 application topically 2 (two)  times daily. (Patient not taking: Reported on 04/19/2019), Disp: 30 g, Rfl: 0 .  OVER THE COUNTER MEDICATION, Place 0.75 mLs under the tongue at bedtime., Disp: , Rfl:  .  Polyethylene Glycol 3350 (PEG 3350) 17 GM/SCOOP POWD, Take 17 g by mouth daily., Disp: , Rfl:  .  rivastigmine (EXELON) 9.5 mg/24hr, Place 1 patch onto the skin daily., Disp: , Rfl:  .  sodium chloride (OCEAN) 0.65 % SOLN nasal spray, Place 2 sprays into both nostrils 2 (two) times daily as needed for congestion., Disp: , Rfl:  .  terazosin (HYTRIN) 5 MG capsule, Take 5 mg by mouth 2 (two) times daily. , Disp: , Rfl:  .  traMADol (ULTRAM) 50 MG tablet, Take 50 mg by mouth every 6 (six) hours as needed for moderate  pain. , Disp: , Rfl:  .  vitamin E 1000 UNIT capsule, Take 1,000 Units by mouth daily. , Disp: , Rfl:   Past Medical History: Past Medical History:  Diagnosis Date  . Anxiety   . Chronic back pain   . DDD (degenerative disc disease), cervical   . DDD (degenerative disc disease), lumbar   . Dementia ([place])   . Depression   . Hypertension   . Meniere's disease   . Pneumonia 2015  . Umbilical hernia   . UTI (urinary tract infection)     Tobacco Use: Social History   Tobacco Use  Smoking Status Former Smoker  . Quit date: 08/02/1962  . Years since quitting: 56.9  Smokeless Tobacco Never Used    Labs: Recent Review [facility] Latest Ref Rng & Units 04/19/2019   Cholestrol 0 - 200 mg/dL 162   LDLCALC 0 - 99 mg/dL 104(H)   HDL >40 mg/dL 44   Trlycerides <150 mg/dL 70       Exercise Target Goals: Exercise Program Goal: Individual exercise prescription set using results from initial 6 min walk test and THRR while considering  patient's activity barriers and safety.   Exercise Prescription Goal: Initial exercise prescription builds to 30-45 minutes a day of aerobic activity, 2-3 days per week.  Home exercise guidelines will be given to patient during program as part of exercise prescription that the participant will acknowledge.  Activity Barriers & Risk Stratification: Activity Barriers & Cardiac Risk Stratification - 05/23/19 1119      Activity Barriers & Cardiac Risk Stratification   Activity Barriers  Balance Concerns;Back Problems;Other (comment)   DJD Neck and Back, "crippled when wakes up-sits with ice pack for 30 min and is better to move   Comments  Left foot goes to sleep when walks or stands for a long time.   Wll improve after more movement    Cardiac Risk Stratification  Moderate       6 Minute Walk: 6 Minute Walk    Row Name 05/28/19 1314         6 Minute Walk   Phase  Initial     Distance  1532 feet      Walk Time  6 minutes     # of Rest Breaks  0     MPH  2.9     METS  2.7     RPE  7     Perceived Dyspnea   0     VO2 Peak  9.35     Symptoms  No     Resting HR  58 bpm  Resting BP  108/70     Resting Oxygen Saturation   98 %     Exercise Oxygen Saturation  during 6 min walk  94 %     Max Ex. HR  92 bpm     Max Ex. BP  132/66     2 Minute Post BP  114/70        Oxygen Initial Assessment:   Oxygen Re-Evaluation:   Oxygen Discharge (Final Oxygen Re-Evaluation):   Initial Exercise Prescription: Initial Exercise Prescription - 05/28/19 1300      Date of Initial Exercise RX and Referring Provider   Date  05/28/19    Referring Provider  Paraschos      Treadmill   MPH  2.5    Grade  0.5    Minutes  15    METs  3.09      Recumbant Bike   Level  3    RPM  60    Watts  20    Minutes  15    METs  2.7      NuStep   Level  4    SPM  80    Minutes  15    METs  2.7      Elliptical   Level  1    Speed  3    Minutes  15      Biostep-RELP   Level  4    SPM  50    Minutes  15    METs  3      Prescription Details   Frequency (times per week)  3    Duration  Progress to 30 minutes of continuous aerobic without signs/symptoms of physical distress      Intensity   THRR 40-80% of Max Heartrate  92-126    Ratings of Perceived Exertion  11-13    Perceived Dyspnea  0-4      Progression   Progression  Continue to progress workloads to maintain intensity without signs/symptoms of physical distress.      Resistance Training   Training Prescription  Yes    Weight  8 lb    Reps  10-15       Perform Capillary Blood Glucose checks as needed.  Exercise Prescription Changes: Exercise Prescription Changes    Row Name 06/08/19 1000             Response to Exercise   Blood Pressure (Admit)  132/78       Blood Pressure (Exercise)  142/74       Blood Pressure (Exit)  104/62       Heart Rate (Admit)  56 bpm       Heart Rate (Exercise)  90 bpm       Heart  Rate (Exit)  66 bpm       Rating of Perceived Exertion (Exercise)  11       Symptoms  none       Duration  Continue with 30 min of aerobic exercise without signs/symptoms of physical distress.       Intensity  THRR unchanged         Progression   Progression  Continue to progress workloads to maintain intensity without signs/symptoms of physical distress.       Average METs  2.9         Resistance Training   Training Prescription  Yes       Weight  10 lb  Reps  10-15         Interval Training   Interval Training  No         Treadmill   MPH  2.5       Grade  0.5       Minutes  15       METs  3.09         Recumbant Bike   Level  3       RPM  60       Minutes  15       METs  2.7         Elliptical   Level  1       Minutes  15         Biostep-RELP   Level  4       SPM  50       Minutes  15       METs  3          Exercise Comments: Exercise Comments    Row Name 05/29/19 1054           Exercise Comments  First full day of exercise!  Patient was oriented to gym and equipment including functions, settings, policies, and procedures.  Patient's individual exercise prescription and treatment plan were reviewed.  All starting workloads were established based on the results of the 6 minute walk test done at initial orientation visit.  The plan for exercise progression was also introduced and progression will be customized based on patient's performance and goals.          Exercise Goals and Review: Exercise Goals    Row Name 05/28/19 1323             Exercise Goals   Increase Physical Activity  Yes       Expected Outcomes  Short Term: Attend rehab on a regular basis to increase amount of physical activity.;Long Term: Add in home exercise to make exercise part of routine and to increase amount of physical activity.;Long Term: Exercising regularly at least 3-5 days a week.       Increase Strength and Stamina  Yes       Intervention  Provide advice, education,  support and counseling about physical activity/exercise needs.;Develop an individualized exercise prescription for aerobic and resistive training based on initial evaluation findings, risk stratification, comorbidities and participant's personal goals.       Expected Outcomes  Short Term: Increase workloads from initial exercise prescription for resistance, speed, and METs.;Short Term: Perform resistance training exercises routinely during rehab and add in resistance training at home;Long Term: Improve cardiorespiratory fitness, muscular endurance and strength as measured by increased METs and functional capacity (6MWT)       Able to understand and use rate of perceived exertion (RPE) scale  Yes       Intervention  Provide education and explanation on how to use RPE scale       Expected Outcomes  Short Term: Able to use RPE daily in rehab to express subjective intensity level;Long Term:  Able to use RPE to guide intensity level when exercising independently       Knowledge and understanding of Target Heart Rate Range (THRR)  Yes       Intervention  Provide education and explanation of THRR including how the numbers were predicted and where they are located for reference       Expected Outcomes  Short Term: Able to state/look up  THRR;Short Term: Able to use daily as guideline for intensity in rehab;Long Term: Able to use THRR to govern intensity when exercising independently       Able to check pulse independently  Yes       Intervention  Provide education and demonstration on how to check pulse in carotid and radial arteries.;Review the importance of being able to check your own pulse for safety during independent exercise       Expected Outcomes  Short Term: Able to explain why pulse checking is important during independent exercise;Long Term: Able to check pulse independently and accurately       Understanding of Exercise Prescription  Yes       Intervention  Provide education, explanation, and written  materials on patient's individual exercise prescription       Expected Outcomes  Short Term: Able to explain program exercise prescription;Long Term: Able to explain home exercise prescription to exercise independently          Exercise Goals Re-Evaluation : Exercise Goals Re-Evaluation    Row Name 05/29/19 1054 06/08/19 1006 06/14/19 1118         Exercise Goal Re-Evaluation   Exercise Goals Review  Increase Physical Activity;Increase Strength and Stamina;Able to understand and use rate of perceived exertion (RPE) scale;Able to understand and use Dyspnea scale;Knowledge and understanding of Target Heart Rate Range (THRR);Understanding of Exercise Prescription  Increase Physical Activity;Increase Strength and Stamina;Able to understand and use rate of perceived exertion (RPE) scale;Knowledge and understanding of Target Heart Rate Range (THRR);Able to check pulse independently;Understanding of Exercise Prescription  Increase Physical Activity;Increase Strength and Stamina;Understanding of Exercise Prescription     Comments  Reviewed RPE scale, THR and program prescription with pt today.  Pt voiced understanding and was given a copy of goals to take home.  Gershon Mussel has done very well so far.  The elliptical is most challengin for him - her reproted being very tired after the first session he did elliptical.  He was taken off of one of his BP meds and felt much more energy since then.  He is cleared by his Dr to use 10 lb weights.  Gershon Mussel is doing well in rehab.  He is enjoying come to class and getting back into exercise again.  He used to run and bike and now walks.  He used to be up to a 1-2 miles a day.Reviewed home exercise with pt today.  Pt plans to walking and using weights at home for exercise.  He also belongs  [employer] once it reopens. Reviewed THR, pulse, RPE, sign and symptoms, NTG use, and when to call 911 or MD.  Also discussed weather considerations and indoor options.  Pt voiced  understanding.     Expected Outcomes  Short: Use RPE daily to regulate intensity. Long: Follow program prescription in THR.  Short - continue to attend consistently Long - increase overall MET level  Short: Start to add home exercise back in.  Long: Continue to increase stamina.        Discharge Exercise Prescription (Final Exercise Prescription Changes): Exercise Prescription Changes - 06/08/19 1000      Response to Exercise   Blood Pressure (Admit)  132/78    Blood Pressure (Exercise)  142/74    Blood Pressure (Exit)  104/62    Heart Rate (Admit)  56 bpm    Heart Rate (Exercise)  90 bpm    Heart Rate (Exit)  66 bpm    Rating  of Perceived Exertion (Exercise)  11    Symptoms  none    Duration  Continue with 30 min of aerobic exercise without signs/symptoms of physical distress.    Intensity  THRR unchanged      Progression   Progression  Continue to progress workloads to maintain intensity without signs/symptoms of physical distress.    Average METs  2.9      Resistance Training   Training Prescription  Yes    Weight  10 lb    Reps  10-15      Interval Training   Interval Training  No      Treadmill   MPH  2.5    Grade  0.5    Minutes  15    METs  3.09      Recumbant Bike   Level  3    RPM  60    Minutes  15    METs  2.7      Elliptical   Level  1    Minutes  15      Biostep-RELP   Level  4    SPM  50    Minutes  15    METs  3       Nutrition:  Target Goals: Understanding of nutrition guidelines, daily intake of sodium '1500mg'$ , cholesterol '200mg'$ , calories 30% from fat and 7% or less from saturated fats, daily to have 5 or more servings of fruits and vegetables.  Biometrics: Pre Biometrics - 05/28/19 1323      Pre Biometrics   Height  5' 8.25" (1.734 m)    Weight  204 lb (92.5 kg)    BMI (Calculated)  30.78    Single Leg Stand  2.4 seconds        Nutrition Therapy Plan and Nutrition Goals: Nutrition Therapy & Goals - 05/28/19 1200       Nutrition Therapy   Diet  low Na, HH diet    Protein (specify units)  75g    Fiber  30 grams    Whole Grain Foods  3 servings    Saturated Fats  12 max. grams    Fruits and Vegetables  5 servings/day    Sodium  1.5 grams      Personal Nutrition Goals   Nutrition Goal  ST: have at least 2 servings of Pro per day and sneak in some more (like soymilk) LT: walk better and get stronger.    Comments  Spoke with pt and wife. B pt has cereal with almond milk and coffee (creamer/splenda). L salad with veggies olive oil and vinegar and some chickpeas (a couple of tbsp) or some peanut butter and fruit or tomato sandwich on homemade bread and dukes mayo or fruit and cottage cheese. D: chicken sald or tuna sald or buger or rice and beans; usually just steamed vegetables and mashed potatoes (smart balance with olive oil). Pt reports using canola oil in baking. Pt reports not liking meat too much, discussed other ways to eat consistent protein like switching to soymilk or adding more chickpeas to salad, or when having yogurt and fruit to have greek yogurt which is higher in protein. Pt has been watching Na, chosing low salt options. Discussed HH eating.      Intervention Plan   Intervention  Prescribe, educate and counsel regarding individualized specific dietary modifications aiming towards targeted core components such as weight, hypertension, lipid management, diabetes, heart failure and other comorbidities.;Nutrition handout(s) given to patient.  Expected Outcomes  Short Term Goal: Understand basic principles of dietary content, such as calories, fat, sodium, cholesterol and nutrients.;Short Term Goal: A plan has been developed with personal nutrition goals set during dietitian appointment.;Long Term Goal: Adherence to prescribed nutrition plan.       Nutrition Assessments: Nutrition Assessments - 05/24/19 1053      MEDFICTS Scores   Pre Score  20       Nutrition Goals Re-Evaluation:   Nutrition  Goals Discharge (Final Nutrition Goals Re-Evaluation):   Psychosocial: Target Goals: Acknowledge presence or absence of significant depression and/or stress, maximize coping skills, provide positive support system. Participant is able to verbalize types and ability to use techniques and skills needed for reducing stress and depression.   Initial Review & Psychosocial Screening: Initial Psych Review & Screening - 05/23/19 1122      Initial Review   Current issues with  History of Depression;Current Psychotropic Meds    Source of Stress Concerns  Chronic Illness    Comments  Alzihemers  slow progression- Short to term memory is starting to degrade.  ABle to follow directions, may not remeber he was in class after he goes home      [place]?  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      Screening Interventions   Interventions  Encouraged to exercise    Expected Outcomes  Short Term goal: Utilizing psychosocial counselor, staff and physician to assist with identification of specific Stressors or current issues interfering with healing process. Setting desired goal for each stressor or current issue identified.;Long Term Goal: Stressors or current issues are controlled or eliminated.;Short Term goal: Identification and review with participant of any Quality of Life or Depression concerns found by scoring the questionnaire.;Long Term goal: The participant improves quality of Life and PHQ9 Scores as seen by post scores and/or verbalization of changes       Quality of Life Scores:  Quality of Life - 05/24/19 1052      Quality of Life   Select  Quality of Life      Quality of Life Scores   Health/Function Pre  20 %    Socioeconomic Pre  24.31 %    Psych/Spiritual Pre   23.14 %    Family Pre  23.4 %    GLOBAL Pre  22.1 %      Scores of 19 and below usually indicate a poorer quality of life in these areas.  A difference of  2-3 points is a clinically meaningful difference.  A difference of 2-3 points in the total score of the Quality of Life Index has been associated with significant improvement in overall quality of life, self-image, physical symptoms, and general health in studies assessing change in quality of life.  PHQ-9: Recent Review Flowsheet Data    Depression screen [facility] 2/9 05/28/2019   Decreased Interest 2   Down, Depressed, Hopeless 1   PHQ - 2 Score 3   Altered sleeping 1   Tired, decreased energy 3   Change in appetite 0   Feeling bad or failure about yourself  0   Trouble concentrating 1   Moving slowly or fidgety/restless 0   Suicidal thoughts 0   PHQ-9 Score 8   Difficult  doing work/chores Somewhat difficult     Interpretation of Total Score  Total Score Depression Severity:  1-4 = Minimal depression, 5-9 = Mild depression, 10-14 = Moderate depression, 15-19 = Moderately severe depression, 20-27 = Severe depression   Psychosocial Evaluation and Intervention:   Psychosocial Re-Evaluation: Psychosocial Re-Evaluation    [place] Name 06/14/19 1119             Psychosocial Re-Evaluation   Current issues with  Current Stress Concerns;Current Depression;Current Psychotropic Meds       Comments  Gershon Mussel is doing well in rehab. He is on prozac and doing well.  He has good day and bad days. He views each day as a gift.  He is hoping to make it to 28 as his dad died at 71.  He sleeps pretty well and uses xanax to help.  He was using melatonin but Dr. Sabra Heck took him off.  If he doesn't take his Xanax he wakes up at 3am.       Expected Outcomes  Short: Continue to work through each day Long: Continue to stay positive.       Interventions  Encouraged to attend Cardiac Rehabilitation for the exercise       Continue Psychosocial Services   Follow  up required by staff          Psychosocial Discharge (Final Psychosocial Re-Evaluation): Psychosocial Re-Evaluation - 06/14/19 1119      Psychosocial Re-Evaluation   Current issues with  Current Stress Concerns;Current Depression;Current Psychotropic Meds    Comments  Gershon Mussel is doing well in rehab. He is on prozac and doing well.  He has good day and bad days. He views each day as a gift.  He is hoping to make it to 65 as his dad died at 55.  He sleeps pretty well and uses xanax to help.  He was using melatonin but Dr. Sabra Heck took him off.  If he doesn't take his Xanax he wakes up at 3am.    Expected Outcomes  Short: Continue to work through each day Long: Continue to stay positive.    Interventions  Encouraged to attend Cardiac Rehabilitation for the exercise    Continue Psychosocial Services   Follow up required by staff       Vocational Rehabilitation: Provide vocational rehab assistance to qualifying candidates.   Vocational Rehab Evaluation & Intervention: Vocational Rehab - 05/23/19 1129      Initial Vocational Rehab Evaluation & Intervention   Assessment shows need for Vocational Rehabilitation  No       Education: Education Goals: Education classes will be provided on a variety of topics geared toward better understanding of heart health and risk factor modification. Participant will state understanding/return demonstration of topics presented as noted by education test scores.  Learning Barriers/Preferences: Learning Barriers/Preferences - 05/23/19 1128      Learning Barriers/Preferences   Learning Barriers  --   Memory   Learning Preferences  Individual Instruction;Written Material;Video       Education Topics:  AED/CPR: - Group verbal and written instruction with the use of models to demonstrate the basic use of the AED with the basic ABC's of resuscitation.   General Nutrition Guidelines/Fats and Fiber: -Group instruction provided by verbal, written material,  models and posters to present the general guidelines for heart healthy nutrition. Gives an explanation and review of dietary fats and fiber.   Controlling Sodium/Reading Food Labels: -Group verbal and written material supporting the discussion of sodium use in heart  healthy nutrition. Review and explanation with models, verbal and written materials for utilization of the food label.   Exercise Physiology & General Exercise Guidelines: - Group verbal and written instruction with models to review the exercise physiology of the cardiovascular system and associated critical values. Provides general exercise guidelines with specific guidelines to those with heart or lung disease.    Aerobic Exercise & Resistance Training: - Gives group verbal and written instruction on the various components of exercise. Focuses on aerobic and resistive training programs and the benefits of this training and how to safely progress through these programs..   Flexibility, Balance, Mind/Body Relaxation: Provides group verbal/written instruction on the benefits of flexibility and balance training, including mind/body exercise modes such as yoga, pilates and tai chi.  Demonstration and skill practice provided.   Stress and Anxiety: - Provides group verbal and written instruction about the health risks of elevated stress and causes of high stress.  Discuss the correlation between heart/lung disease and anxiety and treatment options. Review healthy ways to manage with stress and anxiety.   Depression: - Provides group verbal and written instruction on the correlation between heart/lung disease and depressed mood, treatment options, and the stigmas associated with seeking treatment.   Anatomy & Physiology of the Heart: - Group verbal and written instruction and models provide basic cardiac anatomy and physiology, with the coronary electrical and arterial systems. Review of Valvular disease and Heart  Failure   Cardiac Procedures: - Group verbal and written instruction to review commonly prescribed medications for heart disease. Reviews the medication, class of the drug, and side effects. Includes the steps to properly store meds and maintain the prescription regimen. (beta blockers and nitrates)   Cardiac Medications I: - Group verbal and written instruction to review commonly prescribed medications for heart disease. Reviews the medication, class of the drug, and side effects. Includes the steps to properly store meds and maintain the prescription regimen.   Cardiac Medications II: -Group verbal and written instruction to review commonly prescribed medications for heart disease. Reviews the medication, class of the drug, and side effects. (all other drug classes)    Go Sex-Intimacy & Heart Disease, Get SMART - Goal Setting: - Group verbal and written instruction through game format to discuss heart disease and the return to sexual intimacy. Provides group verbal and written material to discuss and apply goal setting through the application of the S.M.A.R.T. Method.   Other Matters of the Heart: - Provides group verbal, written materials and models to describe Stable Angina and Peripheral Artery. Includes description of the disease process and treatment options available to the cardiac patient.   Exercise & Equipment Safety: - Individual verbal instruction and demonstration of equipment use and safety with use of the equipment.   Cardiac Rehab from 05/28/2019 in [facility] Cardiac and Pulmonary Rehab  Date  05/28/19  Educator  AS  Instruction Review Code  1- Verbalizes Understanding      Infection Prevention: - Provides verbal and written material to individual with discussion of infection control including proper hand washing and proper equipment cleaning during exercise session.   Cardiac Rehab from 05/28/2019 in [facility] Cardiac and Pulmonary Rehab  Date  05/28/19  Educator  AS   Instruction Review Code  1- Verbalizes Understanding      Falls Prevention: - Provides verbal and written material to individual with discussion of falls prevention and safety.   Cardiac Rehab from 05/28/2019 in [facility] Cardiac and Pulmonary Rehab  Date  05/28/19  Educator  AS  Instruction Review Code  1- Verbalizes Understanding      Diabetes: - Individual verbal and written instruction to review signs/symptoms of diabetes, desired ranges of glucose level fasting, after meals and with exercise. Acknowledge that pre and post exercise glucose checks will be done for 3 sessions at entry of program.   Know Your Numbers and Risk Factors: -Group verbal and written instruction about important numbers in your health.  Discussion of what are risk factors and how they play a role in the disease process.  Review of Cholesterol, Blood Pressure, Diabetes, and BMI and the role they play in your overall health.   Sleep Hygiene: -Provides group verbal and written instruction about how sleep can affect your health.  Define sleep hygiene, discuss sleep cycles and impact of sleep habits. Review good sleep hygiene tips.    Other: -Provides group and verbal instruction on various topics (see comments)   Knowledge Questionnaire Score: Knowledge Questionnaire Score - 05/24/19 1052      Knowledge Questionnaire Score   Pre Score  26/26       Core Components/Risk Factors/Patient Goals at Admission: Personal Goals and Risk Factors at Admission - 05/28/19 1324      Core Components/Risk Factors/Patient Goals on Admission    Weight Management  Yes    Admit Weight  204 lb (92.5 kg)    Hypertension  Yes    Intervention  Provide education on lifestyle modifcations including regular physical activity/exercise, weight management, moderate sodium restriction and increased consumption of fresh fruit, vegetables, and low fat dairy, alcohol moderation, and smoking cessation.;Monitor prescription use compliance.     Expected Outcomes  Short Term: Continued assessment and intervention until BP is < 140/51m HG in hypertensive participants. < 130/863mHG in hypertensive participants with diabetes, heart failure or chronic kidney disease.;Long Term: Maintenance of blood pressure at goal levels.    Lipids  Yes    Intervention  Provide education and support for participant on nutrition & aerobic/resistive exercise along with prescribed medications to achieve LDL '70mg'$ , HDL >'40mg'$ .    Expected Outcomes  Short Term: Participant states understanding of desired cholesterol values and is compliant with medications prescribed. Participant is following exercise prescription and nutrition guidelines.;Long Term: Cholesterol controlled with medications as prescribed, with individualized exercise RX and with personalized nutrition plan. Value goals: LDL < '70mg'$ , HDL > 40 mg.       Core Components/Risk Factors/Patient Goals Review:  Goals and Risk Factor Review    Row Name 06/14/19 1121             Core Components/Risk Factors/Patient Goals Review   Personal Goals Review  Weight Management/Obesity;Hypertension;Lipids       Review  Tom's weight has been steady. He is starting to lose now thanks to some diet tweaks.  His blood pressures have improved and he is feeling better overall.  He checks it every morning at home.  He has not noted any problems with his medications.       Expected Outcomes  Short: Continue to work on weight. Long: Continue to monitor risk factors.          Core Components/Risk Factors/Patient Goals at Discharge (Final Review):  Goals and Risk Factor Review - 06/14/19 1121      Core Components/Risk Factors/Patient Goals Review   Personal Goals Review  Weight Management/Obesity;Hypertension;Lipids    Review  Tom's weight has been steady. He is starting to lose now thanks to some diet tweaks.  His blood  pressures have improved and he is feeling better overall.  He checks it every morning at home.   He has not noted any problems with his medications.    Expected Outcomes  Short: Continue to work on weight. Long: Continue to monitor risk factors.       ITP Comments: ITP Comments    Row Name 05/23/19 1137 05/28/19 1311 05/30/19 0554 06/27/19 0556     ITP Comments  Virtual CAll for Cardiac Rehab Orientation completed. Has appt on 8/10 for EP/RD Evals and gym orientaiton.  Documentation of diagnosis can be found in [facility] 04/19/19  Completed 6MWT and initial nutrition evaluation  30 Day Review Completed today. Continue with ITP unless changed by Medical Director review.   New to program  30 Day review. Continue with ITP unless directed changes per Medical Director review.       Comments:

## 2019-06-28 ENCOUNTER — Encounter: Payer: Medicare Other | Admitting: *Deleted

## 2019-06-28 ENCOUNTER — Other Ambulatory Visit: Payer: Self-pay

## 2019-06-28 DIAGNOSIS — I213 ST elevation (STEMI) myocardial infarction of unspecified site: Secondary | ICD-10-CM

## 2019-06-28 NOTE — Progress Notes (Signed)
Daily Session Note  Patient Details  Name: DERRON PIPKINS MRN: [id number] Date of Birth: [birth year] Referring Provider:     Cardiac Rehab from 05/28/2019 in [facility] Cardiac and Pulmonary Rehab  Referring Provider  Paraschos      Encounter Date: 06/28/2019  Check In: Session Check In - 06/28/19 1050      Check-In   Supervising physician immediately available to respond to emergencies  See telemetry face sheet for immediately available ER MD    Location  [facility]-Cardiac & Pulmonary Rehab    Staff Present  Renita Papa, RN Vickki Hearing, BA, ACSM CEP, Exercise Physiologist;Jeanna Durrell BS, Exercise Physiologist    Virtual Visit  No    Medication changes reported      No    Fall or balance concerns reported     No    Warm-up and Cool-down  Performed on first and last piece of equipment    Resistance Training Performed  Yes    VAD Patient?  No      Pain Assessment   Currently in Pain?  No/denies          Social History   Tobacco Use  Smoking Status Former Smoker  . Quit date: 08/02/1962  . Years since quitting: 56.9  Smokeless Tobacco Never Used    Goals Met:  Independence with exercise equipment Exercise tolerated well No report of cardiac concerns or symptoms Strength training completed today  Goals Unmet:  Not Applicable  Comments: Pt able to follow exercise prescription today without complaint.  Will continue to monitor for progression.    Dr. Emily Filbert is Medical Director for [place] and [facility].

## 2019-06-29 ENCOUNTER — Encounter: Payer: Medicare Other | Admitting: *Deleted

## 2019-06-29 DIAGNOSIS — I213 ST elevation (STEMI) myocardial infarction of unspecified site: Secondary | ICD-10-CM

## 2019-06-29 NOTE — Progress Notes (Signed)
Daily Session Note  Patient Details  Name: RHIAN FUNARI MRN: [id number] Date of Birth: 1941/04/01 Referring Provider:     Cardiac Rehab from 05/28/2019 in [facility] Cardiac and Pulmonary Rehab  Referring Provider  Paraschos      Encounter Date: 06/29/2019  Check In: Session Check In - 06/29/19 1040      Check-In   Supervising physician immediately available to respond to emergencies  See telemetry face sheet for immediately available ER MD    Location  [facility]-Cardiac & Pulmonary Rehab    Staff Present  Heath Lark, RN, BSN, CCRP;Jessica [place], MA, RCEP, CCRP, [place], [place], ACSM CEP, Exercise Physiologist    Virtual Visit  No    Medication changes reported      No    Fall or balance concerns reported     No    Warm-up and Cool-down  Performed on first and last piece of equipment    Resistance Training Performed  Yes    VAD Patient?  No    PAD/SET Patient?  No      Pain Assessment   Currently in Pain?  No/denies          Social History   Tobacco Use  Smoking Status Former Smoker  . Quit date: 08/02/1962  . Years since quitting: 56.9  Smokeless Tobacco Never Used    Goals Met:  Independence with exercise equipment Exercise tolerated well No report of cardiac concerns or symptoms  Goals Unmet:  Not Applicable  Comments: Pt able to follow exercise prescription today without complaint.  Will continue to monitor for progression.    Dr. Emily Filbert is Medical Director for [place] and [facility].

## 2019-07-03 ENCOUNTER — Other Ambulatory Visit: Payer: Self-pay

## 2019-07-03 ENCOUNTER — Encounter: Payer: Medicare Other | Admitting: *Deleted

## 2019-07-03 DIAGNOSIS — I213 ST elevation (STEMI) myocardial infarction of unspecified site: Secondary | ICD-10-CM | POA: Diagnosis not present

## 2019-07-03 NOTE — Progress Notes (Signed)
Daily Session Note  Patient Details  Name: Marvin Jefferson MRN: [id number] Date of Birth: [birth year] Referring Provider:     Cardiac Rehab from 05/28/2019 in [facility] Cardiac and Pulmonary Rehab  Referring Provider  Paraschos      Encounter Date: 07/03/2019  Check In: Session Check In - 07/03/19 1048      Check-In   Supervising physician immediately available to respond to emergencies  See telemetry face sheet for immediately available ER MD    Location  [facility]-Cardiac & Pulmonary Rehab    Staff Present  Heath Lark, RN, BSN, CCRP;Melissa [place] RDN, LDN;Joseph [employer], [place], ACSM CEP, Exercise Physiologist    Virtual Visit  No    Medication changes reported      No    Fall or balance concerns reported     No    Warm-up and Cool-down  Performed on first and last piece of equipment    Resistance Training Performed  Yes    VAD Patient?  No    PAD/SET Patient?  No      Pain Assessment   Currently in Pain?  No/denies          Social History   Tobacco Use  Smoking Status Former Smoker  . Quit date: 08/02/1962  . Years since quitting: 56.9  Smokeless Tobacco Never Used    Goals Met:  Independence with exercise equipment Exercise tolerated well No report of cardiac concerns or symptoms  Goals Unmet:  Not Applicable  Comments: Pt able to follow exercise prescription today without complaint.  Will continue to monitor for progression.    Dr. Emily Filbert is Medical Director for [place] and [facility].

## 2019-07-04 ENCOUNTER — Encounter: Payer: Self-pay | Admitting: Physician Assistant

## 2019-07-04 ENCOUNTER — Ambulatory Visit (INDEPENDENT_AMBULATORY_CARE_PROVIDER_SITE_OTHER): Payer: Medicare Other | Admitting: Physician Assistant

## 2019-07-04 VITALS — BP 146/88 | HR 57 | Ht 68.25 in | Wt 200.6 lb

## 2019-07-04 DIAGNOSIS — R339 Retention of urine, unspecified: Secondary | ICD-10-CM

## 2019-07-04 LAB — BLADDER SCAN AMB NON-IMAGING: Scan Result: 321

## 2019-07-04 MED ORDER — TAMSULOSIN HCL 0.4 MG PO CAPS: 0.4000 mg | ORAL_CAPSULE | Freq: Every day | ORAL | 2 refills | Status: DC

## 2019-07-04 NOTE — Progress Notes (Signed)
07/04/2019 11:16 AM   Marvin Jefferson 12-28-1940 DX:[id number]  CC: Possible urinary retention  HPI: Marvin Jefferson is a 78 y.o. male who presents today for evaluation of possible urinary retention. He is an established [facility] patient who last saw Dr. 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 He has a history of urinary retention requiring Foley catheter placement. He also has a history of urinary tract infection including pyelonephritis.  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 He has been followed closely by cardiology since. He reports a recent history of hypotension thought secondary to polypharmacy, with Dr. Ubaldo Glassing taking him off terazosin in early August.  Patient reports difficulty urinating since yesterday. He states he had not needed to catheterize himself in at least six months, however he attempted this twice since yesterday with no output. He was able to spontaneously void approximately 145mL this morning. He reports some suprapubic and low back pain. He is concerned that he may be retaining urine.  He denies dysuria, fevers, chills, nausea, vomiting, and gross hematuria.  PVR 350mL, patient was able to self-cath in clinic today with output 323mL clear yellow urine.  PMH: Past Medical History:  Diagnosis Date  . Anxiety   . Chronic back pain   . DDD (degenerative disc disease), cervical   . DDD (degenerative disc disease), lumbar   . Dementia ([place])   . Depression   . Hypertension   . Meniere's disease   . Pneumonia 2015  . Umbilical hernia   . UTI (urinary tract infection)     Surgical History: Past Surgical History:  Procedure Laterality Date  . APPENDECTOMY    . COLONOSCOPY WITH PROPOFOL N/A 05/23/2017   Procedure: COLONOSCOPY WITH PROPOFOL;  Surgeon: Lollie Sails, MD;  Location: [facility] ENDOSCOPY;  Service:  Endoscopy;  Laterality: N/A;  . CORONARY/GRAFT ACUTE MI REVASCULARIZATION N/A 04/19/2019   Procedure: Coronary/Graft Acute MI Revascularization;  Surgeon: Isaias Cowman, MD;  Location: [place] CV LAB;  Service: Cardiovascular;  Laterality: N/A;  . ESOPHAGOGASTRODUODENOSCOPY (EGD) WITH PROPOFOL N/A 05/23/2017   Procedure: ESOPHAGOGASTRODUODENOSCOPY (EGD) WITH PROPOFOL;  Surgeon: Lollie Sails, MD;  Location: [facility] ENDOSCOPY;  Service: Endoscopy;  Laterality: N/A;  . GANGLION CYST EXCISION Right    wrist  . HERNIA REPAIR     umbilical hernia  . 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 N/A 08/23/2016   Procedure: 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;  Surgeon: Hollice Espy, MD;  Location: [facility] ORS;  Service: Urology;  Laterality: N/A;  . 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 N/A 12/06/2016   Procedure: 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;  Surgeon: Hollice Espy, MD;  Location: [facility] ORS;  Service: Urology;  Laterality: N/A;  . LEFT HEART CATH AND CORONARY ANGIOGRAPHY N/A 04/19/2019   Procedure: LEFT HEART CATH AND CORONARY ANGIOGRAPHY;  Surgeon: Isaias Cowman, MD;  Location: [place] CV LAB;  Service: Cardiovascular;  Laterality: N/A;    Home Medications:  Allergies as of 07/04/2019      Reactions   Clarithromycin Other (See Comments)   Terrible taste and smell      Medication List       Accurate as of July 04, 2019 11:16 AM. If you have any questions, ask your nurse or doctor.        acetaminophen 500 MG tablet Commonly known  as: TYLENOL Take 1,000 mg by mouth every 6 (six) hours as needed for mild pain.   ALPRAZolam 0.25 MG tablet Commonly known as: XANAX Take 0.25 mg by mouth 2 (two) times daily as needed (dizziness).   amLODipine 5 MG tablet Commonly known as: NORVASC Take by mouth.   aspirin 81 MG chewable tablet Chew 1 tablet (81 mg total) by mouth daily.    atorvastatin 80 MG tablet Commonly known as: LIPITOR Take 1 tablet (80 mg total) by mouth daily at 6 PM.   clopidogrel 75 MG tablet Commonly known as: PLAVIX Take 75 mg by mouth daily.   docusate sodium 100 MG capsule Commonly known as: COLACE Take 100 mg by mouth daily.   FLUoxetine 20 MG capsule Commonly known as: PROZAC Take 20 mg by mouth daily.   fluticasone 50 MCG/ACT nasal spray Commonly known as: FLONASE USE 2 SPRAYS IN EACH NOSTRIL EVERY DAY AS NEEDED FOR ALLERGIES   gabapentin 100 MG capsule Commonly known as: NEURONTIN Take 100 mg by mouth 3 (three) times daily.   galantamine 12 MG tablet Commonly known as: RAZADYNE Take 12 mg by mouth 2 (two) times daily.   hydrocortisone cream 1 % Apply 1 application topically daily as needed for itching.   lactulose 10 GM/15ML solution Commonly known as: CHRONULAC Take 30 mLs (20 g total) by mouth daily as needed for mild constipation.   lisinopril 2.5 MG tablet Commonly known as: ZESTRIL Take 1 tablet (2.5 mg total) by mouth daily.   meclizine 25 MG tablet Commonly known as: ANTIVERT Take 25 mg by mouth 3 (three) times daily as needed for dizziness.   Melatonin 5 MG Tabs Take 1 tablet (5 mg total) by mouth at bedtime.   memantine 5 MG tablet Commonly known as: NAMENDA Take 5 mg by mouth 2 (two) times a day.   methocarbamol 500 MG tablet Commonly known as: ROBAXIN Take 500 mg by mouth 3 (three) times daily as needed for muscle spasms.   methylphenidate 5 MG tablet Commonly known as: RITALIN Take 1 tablet by mouth 2 (two) times daily.   metoprolol tartrate 25 MG tablet Commonly known as: LOPRESSOR Take by mouth.   multivitamin with minerals Tabs tablet Take 1 tablet by mouth daily.   neomycin-bacitracin-polymyxin ointment Commonly known as: NEOSPORIN Apply 1 application topically daily as needed for wound care. apply to eye   nystatin cream Commonly known as: MYCOSTATIN Apply 1 application  topically 2 (two) times daily.   OVER THE COUNTER MEDICATION Place 0.75 mLs under the tongue at bedtime.   PEG 3350 17 GM/SCOOP Powd Take 17 g by mouth daily.   rivastigmine 9.5 mg/24hr Commonly known as: EXELON Place 1 patch onto the skin daily.   sodium chloride 0.65 % Soln nasal spray Commonly known as: OCEAN Place 2 sprays into both nostrils 2 (two) times daily as needed for congestion.   tamsulosin 0.4 MG Caps capsule Commonly known as: FLOMAX Take 1 capsule (0.4 mg total) by mouth daily. Started by: Debroah Loop, PA-C   terazosin 5 MG capsule Commonly known as: HYTRIN Take 5 mg by mouth 2 (two) times daily.   traMADol 50 MG tablet Commonly known as: ULTRAM Take 50 mg by mouth every 6 (six) hours as needed for moderate pain.   Vitamin D3 50 MCG (2000 UT) capsule Take 2,000 Units by mouth daily.   vitamin E 1000 UNIT capsule Take 1,000 Units by mouth daily.       Allergies:  Allergies  Allergen Reactions  . Clarithromycin Other (See Comments)    Terrible taste and smell    Family History: Family History  Problem Relation Age of Onset  . Prostate cancer Father   . Hypertension Father   . Stroke Father   . Hypertension Mother   . Dementia Mother   . Bladder Cancer Neg Hx   . Kidney cancer Neg Hx     Social History:   reports that he quit smoking about 56 years ago. He has never used smokeless tobacco. He reports that he does not drink alcohol or use drugs.  ROS: UROLOGY Frequent Urination?: No Hard to postpone urination?: No Burning/pain with urination?: No Get up at night to urinate?: No Leakage of urine?: No Urine stream starts and stops?: Yes Trouble starting stream?: Yes Do you have to strain to urinate?: No Blood in urine?: No Urinary tract infection?: No Sexually transmitted disease?: No Injury to kidneys or bladder?: No Painful intercourse?: No Weak stream?: Yes Erection problems?: No Penile pain?:  No  Gastrointestinal Nausea?: No Vomiting?: No Indigestion/heartburn?: No Diarrhea?: No Constipation?: No  Constitutional Fever: No Night sweats?: No Weight loss?: No Fatigue?: No  Skin Skin rash/lesions?: No Itching?: No  Eyes Blurred vision?: No Double vision?: No  Ears/Nose/Throat Sore throat?: No Sinus problems?: No  Hematologic/Lymphatic Swollen glands?: No Easy bruising?: No  Cardiovascular Leg swelling?: No Chest pain?: No  Respiratory Cough?: No Shortness of breath?: No  Endocrine Excessive thirst?: No  Musculoskeletal Back pain?: Yes Joint pain?: Yes  Neurological Headaches?: No Dizziness?: Yes  Psychologic Depression?: No Anxiety?: No  Physical Exam: BP (!) 146/88 (BP Location: Left Arm, Patient Position: Sitting, Cuff Size: Normal)   Pulse (!) 57   Ht 5' 8.25" (1.734 m)   Wt 200 lb 9.6 oz (91 kg)   BMI 30.28 kg/m   Constitutional:  Alert and oriented, no acute distress, nontoxic appearing HEENT: [place], AT Cardiovascular: No clubbing, cyanosis, or edema Respiratory: Normal respiratory effort, no increased work of breathing GU: Uncircumcised penis Skin: No rashes, bruises or suspicious lesions Neurologic: Grossly intact, no focal deficits, moving all 4 extremities Psychiatric: Normal mood and affect  Laboratory Data: Results for orders placed or performed in visit on 07/04/19  BLADDER SCAN AMB NON-IMAGING  Result Value Ref Range   Scan Result 321    Assessment & Plan:   1. Incomplete bladder emptying Patient with acute onset difficulty urinating with a history of urinary retention following recent discontinuation of terazosin secondary to hypotension.  He demonstrated his CIC technique in clinic with me today and was able to return 376mL of clear yellow urine.  He denies infective symptoms today, will not send for culture.  Will switch him to tamsulosin 0.4 mg daily today.  I expect this should alleviate his symptoms and minimize  hypotensive side effects.  He notes having approximately 20 catheters at home for CIC as needed.  He seems to have good bladder sensation; I believe he can reliably determine when to CIC as opposed to attempting to spontaneously void.  I expect the tamsulosin should reduce the need for CIC moving forward.  He expressed understanding of this plan. - BLADDER SCAN AMB NON-IMAGING - tamsulosin (FLOMAX) 0.4 MG CAPS capsule; Take 1 capsule (0.4 mg total) by mouth daily.  Dispense: 30 capsule; Refill: [place]  [place] [address], [place], [place] [address] [phone]

## 2019-07-05 ENCOUNTER — Other Ambulatory Visit: Payer: Self-pay

## 2019-07-05 DIAGNOSIS — I213 ST elevation (STEMI) myocardial infarction of unspecified site: Secondary | ICD-10-CM

## 2019-07-05 NOTE — Progress Notes (Signed)
Daily Session Note  Patient Details  Name: Marvin Jefferson MRN: [id number] Date of Birth: 05-27-1941 Referring Provider:     Cardiac Rehab from 05/28/2019 in [facility] Cardiac and Pulmonary Rehab  Referring Provider  Paraschos      Encounter Date: 07/05/2019  Check In: Session Check In - 07/05/19 1134      Check-In   Supervising physician immediately available to respond to emergencies  See telemetry face sheet for immediately available ER MD    Location  [facility]-Cardiac & Pulmonary Rehab    Staff Present  Vida Rigger RN, Vickki Hearing, BA, ACSM CEP, Exercise Physiologist;Jeanna Durrell BS, Exercise Physiologist    Virtual Visit  No    Medication changes reported      No    Fall or balance concerns reported     No    Warm-up and Cool-down  Performed on first and last piece of equipment    Resistance Training Performed  Yes    VAD Patient?  No    PAD/SET Patient?  No      Pain Assessment   Currently in Pain?  No/denies    Multiple Pain Sites  No          Social History   Tobacco Use  Smoking Status Former Smoker  . Quit date: 08/02/1962  . Years since quitting: 56.9  Smokeless Tobacco Never Used    Goals Met:  Independence with exercise equipment Exercise tolerated well No report of cardiac concerns or symptoms Strength training completed today  Goals Unmet:  Not Applicable  Comments: Pt able to follow exercise prescription today without complaint.  Will continue to monitor for progression.   Dr. Emily Filbert is Medical Director for [place] and [facility].

## 2019-07-06 DIAGNOSIS — I213 ST elevation (STEMI) myocardial infarction of unspecified site: Secondary | ICD-10-CM

## 2019-07-06 NOTE — Progress Notes (Signed)
Daily Session Note  Patient Details  Name: Marvin Jefferson MRN: [id number] Date of Birth: 1940/12/05 Referring Provider:     Cardiac Rehab from 05/28/2019 in [facility] Cardiac and Pulmonary Rehab  Referring Provider  Paraschos      Encounter Date: 07/06/2019  Check In: Session Check In - 07/06/19 1047      Check-In   Supervising physician immediately available to respond to emergencies  See telemetry face sheet for immediately available ER MD    Location  [facility]-Cardiac & Pulmonary Rehab    Staff Present  Vida Rigger RN, Vickki Hearing, BA, ACSM CEP, Exercise Physiologist;Jessica [place], MA, RCEP, CCRP, CCET;Joseph [place] [employer]    Virtual Visit  No    Medication changes reported      No    Fall or balance concerns reported     No    Warm-up and Cool-down  Performed on first and last piece of equipment    Resistance Training Performed  Yes    VAD Patient?  No    PAD/SET Patient?  No      Pain Assessment   Currently in Pain?  No/denies    Multiple Pain Sites  No          Social History   Tobacco Use  Smoking Status Former Smoker  . Quit date: 08/02/1962  . Years since quitting: 56.9  Smokeless Tobacco Never Used    Goals Met:  Independence with exercise equipment Exercise tolerated well No report of cardiac concerns or symptoms Strength training completed today  Goals Unmet:  Not Applicable  Comments: Pt able to follow exercise prescription today without complaint.  Will continue to monitor for progression.   Dr. Emily Filbert is Medical Director for [place] and [facility].

## 2019-07-10 ENCOUNTER — Other Ambulatory Visit: Payer: Self-pay

## 2019-07-10 ENCOUNTER — Encounter: Payer: Medicare Other | Admitting: *Deleted

## 2019-07-10 DIAGNOSIS — I213 ST elevation (STEMI) myocardial infarction of unspecified site: Secondary | ICD-10-CM

## 2019-07-10 NOTE — Progress Notes (Signed)
Daily Session Note  Patient Details  Name: BRENNER VISCONTI MRN: [id number] Date of Birth: [birth year] Referring Provider:     Cardiac Rehab from 05/28/2019 in [facility] Cardiac and Pulmonary Rehab  Referring Provider  Paraschos      Encounter Date: 07/10/2019  Check In: Session Check In - 07/10/19 1115      Check-In   Supervising physician immediately available to respond to emergencies  See telemetry face sheet for immediately available ER MD    Location  [facility]-Cardiac & Pulmonary Rehab    Staff Present  Heath Lark, RN, BSN, CCRP;Amanda Sommer, BA, ACSM CEP, Exercise Physiologist;Joseph Hood RCP,RRT,BSRT    Virtual Visit  No    Medication changes reported      No    Fall or balance concerns reported     No    Warm-up and Cool-down  Performed on first and last piece of equipment    Resistance Training Performed  Yes    VAD Patient?  No    PAD/SET Patient?  No      Pain Assessment   Currently in Pain?  No/denies          Social History   Tobacco Use  Smoking Status Former Smoker  . Quit date: 08/02/1962  . Years since quitting: 56.9  Smokeless Tobacco Never Used    Goals Met:  Independence with exercise equipment Exercise tolerated well No report of cardiac concerns or symptoms  Goals Unmet:  Not Applicable  Comments: Pt able to follow exercise prescription today without complaint.  Will continue to monitor for progression.    Dr. Emily Filbert is Medical Director for [place] and [facility].

## 2019-07-12 ENCOUNTER — Other Ambulatory Visit: Payer: Self-pay

## 2019-07-12 DIAGNOSIS — I213 ST elevation (STEMI) myocardial infarction of unspecified site: Secondary | ICD-10-CM

## 2019-07-12 NOTE — Progress Notes (Signed)
Daily Session Note  Patient Details  Name: Marvin Jefferson MRN: [id number] Date of Birth: 1941/05/29 Referring Provider:     Cardiac Rehab from 05/28/2019 in [facility] Cardiac and Pulmonary Rehab  Referring Provider  Paraschos      Encounter Date: 07/12/2019  Check In:      Social History   Tobacco Use  Smoking Status Former Smoker  . Quit date: 08/02/1962  . Years since quitting: 56.9  Smokeless Tobacco Never Used    Goals Met:  Independence with exercise equipment Exercise tolerated well No report of cardiac concerns or symptoms Strength training completed today  Goals Unmet:  Not Applicable  Comments: Pt able to follow exercise prescription today without complaint.  Will continue to monitor for progression.    Dr. Emily Filbert is Medical Director for [place] and [facility].

## 2019-07-13 DIAGNOSIS — I213 ST elevation (STEMI) myocardial infarction of unspecified site: Secondary | ICD-10-CM | POA: Diagnosis not present

## 2019-07-13 NOTE — Progress Notes (Signed)
Daily Session Note  Patient Details  Name: MELBOURNE JAKUBIAK MRN: [id number] Date of Birth: [birth year] Referring Provider:     Cardiac Rehab from 05/28/2019 in [facility] Cardiac and Pulmonary Rehab  Referring Provider  Paraschos      Encounter Date: 07/13/2019  Check In: Session Check In - 07/13/19 1045      Check-In   Supervising physician immediately available to respond to emergencies  See telemetry face sheet for immediately available ER MD    Location  [facility]-Cardiac & Pulmonary Rehab    Staff Present  Vida Rigger RN, Vickki Hearing, BA, ACSM CEP, Exercise Physiologist;Jessica Math[place], [place], RCEP, CCRP, CCET;Joseph [employer]  No    Fall or balance concerns reported     No    Warm-up and Cool-down  Performed on first and last piece of equipment    Resistance Training Performed  Yes    VAD Patient?  No    PAD/SET Patient?  No      Pain Assessment   Currently in Pain?  No/denies    Multiple Pain Sites  No          Social History   Tobacco Use  Smoking Status Former Smoker  . Quit date: 08/02/1962  . Years since quitting: 56.9  Smokeless Tobacco Never Used    Goals Met:  Independence with exercise equipment Exercise tolerated well No report of cardiac concerns or symptoms Strength training completed today  Goals Unmet:  Not Applicable  Comments: Pt able to follow exercise prescription today without complaint.  Will continue to monitor for progression.   Dr. Emily Filbert is Medical Director for [place] and [facility].

## 2019-07-17 ENCOUNTER — Encounter: Payer: Medicare Other | Admitting: *Deleted

## 2019-07-17 ENCOUNTER — Other Ambulatory Visit: Payer: Self-pay

## 2019-07-17 DIAGNOSIS — I213 ST elevation (STEMI) myocardial infarction of unspecified site: Secondary | ICD-10-CM

## 2019-07-17 NOTE — Progress Notes (Signed)
Daily Session Note  Patient Details  Name: Marvin Jefferson MRN: [id number] Date of Birth: 1940/11/02 Referring Provider:     Cardiac Rehab from 05/28/2019 in [facility] Cardiac and Pulmonary Rehab  Referring Provider  Paraschos      Encounter Date: 07/17/2019  Check In: Session Check In - 07/17/19 1117      Check-In   Supervising physician immediately available to respond to emergencies  See telemetry face sheet for immediately available ER MD    Location  [facility]-Cardiac & Pulmonary Rehab    Staff Present  Darel Hong, RN Vickki Hearing, BA, ACSM CEP, Exercise Physiologist;Joseph Tessie Fass RCP,RRT,BSRT;Diane Joya Gaskins RN,BSN;Melissa Fairmont RDN, LDN    Virtual Visit  No    Medication changes reported      No    Fall or balance concerns reported     No    Warm-up and Cool-down  Performed on first and last piece of equipment    Resistance Training Performed  Yes    VAD Patient?  No    PAD/SET Patient?  No      Pain Assessment   Currently in Pain?  No/denies          Social History   Tobacco Use  Smoking Status Former Smoker  . Quit date: 08/02/1962  . Years since quitting: 56.9  Smokeless Tobacco Never Used    Goals Met:  Independence with exercise equipment Exercise tolerated well No report of cardiac concerns or symptoms Strength training completed today  Goals Unmet:  Not Applicable  Comments: Pt able to follow exercise prescription today without complaint.  Will continue to monitor for progression.    Dr. Emily Filbert is Medical Director for [place] and [facility].

## 2019-07-19 ENCOUNTER — Encounter: Payer: Medicare Other | Attending: Cardiology | Admitting: *Deleted

## 2019-07-19 ENCOUNTER — Other Ambulatory Visit: Payer: Self-pay

## 2019-07-19 DIAGNOSIS — Z7902 Long term (current) use of antithrombotics/antiplatelets: Secondary | ICD-10-CM | POA: Insufficient documentation

## 2019-07-19 DIAGNOSIS — F419 Anxiety disorder, unspecified: Secondary | ICD-10-CM | POA: Diagnosis not present

## 2019-07-19 DIAGNOSIS — M549 Dorsalgia, unspecified: Secondary | ICD-10-CM | POA: Insufficient documentation

## 2019-07-19 DIAGNOSIS — Z79899 Other long term (current) drug therapy: Secondary | ICD-10-CM | POA: Diagnosis not present

## 2019-07-19 DIAGNOSIS — I10 Essential (primary) hypertension: Secondary | ICD-10-CM | POA: Insufficient documentation

## 2019-07-19 DIAGNOSIS — G8929 Other chronic pain: Secondary | ICD-10-CM | POA: Diagnosis not present

## 2019-07-19 DIAGNOSIS — F0390 Unspecified dementia without behavioral disturbance: Secondary | ICD-10-CM | POA: Insufficient documentation

## 2019-07-19 DIAGNOSIS — F329 Major depressive disorder, single episode, unspecified: Secondary | ICD-10-CM | POA: Insufficient documentation

## 2019-07-19 DIAGNOSIS — Z7982 Long term (current) use of aspirin: Secondary | ICD-10-CM | POA: Insufficient documentation

## 2019-07-19 DIAGNOSIS — Z87891 Personal history of nicotine dependence: Secondary | ICD-10-CM | POA: Insufficient documentation

## 2019-07-19 DIAGNOSIS — I213 ST elevation (STEMI) myocardial infarction of unspecified site: Secondary | ICD-10-CM

## 2019-07-19 NOTE — Progress Notes (Signed)
Daily Session Note  Patient Details  Name: Marvin Jefferson MRN: [id number] Date of Birth: 05/17/1941 Referring Provider:     Cardiac Rehab from 05/28/2019 in [facility] Cardiac and Pulmonary Rehab  Referring Provider  Paraschos      Encounter Date: 07/19/2019  Check In: Session Check In - 07/19/19 1055      Check-In   Supervising physician immediately available to respond to emergencies  See telemetry face sheet for immediately available ER MD    Location  [facility]-Cardiac & Pulmonary Rehab    Staff Present  Renita Papa, RN Vickki Hearing, BA, ACSM CEP, Exercise Physiologist;Jessica Luan Pulling, MA, RCEP, CCRP, CCET    Virtual Visit  No    Medication changes reported      No    Fall or balance concerns reported     No    Warm-up and Cool-down  Performed on first and last piece of equipment    Resistance Training Performed  Yes    VAD Patient?  No    PAD/SET Patient?  No      Pain Assessment   Currently in Pain?  No/denies          Social History   Tobacco Use  Smoking Status Former Smoker  . Quit date: 08/02/1962  . Years since quitting: 57.0  Smokeless Tobacco Never Used    Goals Met:  Independence with exercise equipment Exercise tolerated well No report of cardiac concerns or symptoms Strength training completed today  Goals Unmet:  Not Applicable  Comments: Pt able to follow exercise prescription today without complaint.  Will continue to monitor for progression.    Dr. Emily Filbert is Medical Director for [place] and [facility].

## 2019-07-25 ENCOUNTER — Encounter: Payer: Self-pay | Admitting: *Deleted

## 2019-07-25 DIAGNOSIS — I213 ST elevation (STEMI) myocardial infarction of unspecified site: Secondary | ICD-10-CM

## 2019-07-25 NOTE — Progress Notes (Signed)
Cardiac Individual Treatment Plan  Patient Details  Name: Marvin Jefferson MRN: [id number] Date of Birth: 1940/11/12 Referring Provider:     Cardiac Rehab from 05/28/2019 in [facility] Cardiac and Pulmonary Rehab  Referring Provider  Paraschos      Initial Encounter Date:    Cardiac Rehab from 05/28/2019 in [facility] Cardiac and Pulmonary Rehab  Date  05/28/19      Visit Diagnosis: ST elevation myocardial infarction (STEMI), unspecified artery ([place])  Patient's Home Medications on Admission:  Current Outpatient Medications:  .  acetaminophen (TYLENOL) 500 MG tablet, Take 1,000 mg by mouth every 6 (six) hours as needed for mild pain. , Disp: , Rfl:  .  ALPRAZolam (XANAX) 0.25 MG tablet, Take 0.25 mg by mouth 2 (two) times daily as needed (dizziness). , Disp: , Rfl:  .  amLODipine (NORVASC) 5 MG tablet, Take by mouth., Disp: , Rfl:  .  aspirin 81 MG chewable tablet, Chew 1 tablet (81 mg total) by mouth daily., Disp: 30 tablet, Rfl: 0 .  atorvastatin (LIPITOR) 80 MG tablet, Take 1 tablet (80 mg total) by mouth daily at 6 PM., Disp: 30 tablet, Rfl: 0 .  Cholecalciferol (VITAMIN D3) 2000 units capsule, Take 2,000 Units by mouth daily. , Disp: , Rfl:  .  clopidogrel (PLAVIX) 75 MG tablet, Take 75 mg by mouth daily., Disp: , Rfl:  .  docusate sodium (COLACE) 100 MG capsule, Take 100 mg by mouth daily. , Disp: , Rfl:  .  FLUoxetine (PROZAC) 20 MG capsule, Take 20 mg by mouth daily., Disp: , Rfl:  .  fluticasone (FLONASE) 50 MCG/ACT nasal spray, USE 2 SPRAYS IN EACH NOSTRIL EVERY DAY AS NEEDED FOR ALLERGIES, Disp: , Rfl:  .  gabapentin (NEURONTIN) 100 MG capsule, Take 100 mg by mouth 3 (three) times daily., Disp: , Rfl:  .  galantamine (RAZADYNE) 12 MG tablet, Take 12 mg by mouth 2 (two) times daily. , Disp: , Rfl:  .  hydrocortisone cream 1 %, Apply 1 application topically daily as needed for itching., Disp: , Rfl:  .  lactulose (CHRONULAC) 10 GM/15ML solution, Take 30 mLs (20 g total) by mouth daily as  needed for mild constipation., Disp: 120 mL, Rfl: 0 .  lisinopril (ZESTRIL) 2.5 MG tablet, Take 1 tablet (2.5 mg total) by mouth daily., Disp: 30 tablet, Rfl: 0 .  meclizine (ANTIVERT) 25 MG tablet, Take 25 mg by mouth 3 (three) times daily as needed for dizziness., Disp: , Rfl:  .  Melatonin 5 MG TABS, Take 1 tablet (5 mg total) by mouth at bedtime., Disp: 30 tablet, Rfl: 0 .  memantine (NAMENDA) 5 MG tablet, Take 5 mg by mouth 2 (two) times a day., Disp: , Rfl:  .  methocarbamol (ROBAXIN) 500 MG tablet, Take 500 mg by mouth 3 (three) times daily as needed for muscle spasms., Disp: , Rfl:  .  methylphenidate (RITALIN) 5 MG tablet, Take 1 tablet by mouth 2 (two) times daily., Disp: , Rfl:  .  metoprolol tartrate (LOPRESSOR) 25 MG tablet, Take by mouth., Disp: , Rfl:  .  Multiple Vitamin (MULTIVITAMIN WITH MINERALS) TABS tablet, Take 1 tablet by mouth daily., Disp: 30 tablet, Rfl: 0 .  neomycin-bacitracin-polymyxin (NEOSPORIN) ointment, Apply 1 application topically daily as needed for wound care. apply to eye, Disp: , Rfl:  .  nystatin cream (MYCOSTATIN), Apply 1 application topically 2 (two) times daily., Disp: 30 g, Rfl: 0 .  OVER THE COUNTER MEDICATION, Place 0.75 mLs under the  tongue at bedtime., Disp: , Rfl:  .  Polyethylene Glycol 3350 (PEG 3350) 17 GM/SCOOP POWD, Take 17 g by mouth daily., Disp: , Rfl:  .  rivastigmine (EXELON) 9.5 mg/24hr, Place 1 patch onto the skin daily., Disp: , Rfl:  .  sodium chloride (OCEAN) 0.65 % SOLN nasal spray, Place 2 sprays into both nostrils 2 (two) times daily as needed for congestion., Disp: , Rfl:  .  tamsulosin (FLOMAX) 0.4 MG CAPS capsule, Take 1 capsule (0.4 mg total) by mouth daily., Disp: 30 capsule, Rfl: 2 .  terazosin (HYTRIN) 5 MG capsule, Take 5 mg by mouth 2 (two) times daily. , Disp: , Rfl:  .  traMADol (ULTRAM) 50 MG tablet, Take 50 mg by mouth every 6 (six) hours as needed for moderate pain. , Disp: , Rfl:  .  vitamin E 1000 UNIT capsule, Take  1,000 Units by mouth daily. , Disp: , Rfl:   Past Medical History: Past Medical History:  Diagnosis Date  . Anxiety   . Chronic back pain   . DDD (degenerative disc disease), cervical   . DDD (degenerative disc disease), lumbar   . Dementia ([place])   . Depression   . Hypertension   . Meniere's disease   . Pneumonia 2015  . Umbilical hernia   . UTI (urinary tract infection)     Tobacco Use: Social History   Tobacco Use  Smoking Status Former Smoker  . Quit date: 08/02/1962  . Years since quitting: 57.0  Smokeless Tobacco Never Used    Labs: Recent Review [facility] Latest Ref Rng & Units 04/19/2019   Cholestrol 0 - 200 mg/dL 162   LDLCALC 0 - 99 mg/dL 104(H)   HDL >40 mg/dL 44   Trlycerides <150 mg/dL 70       Exercise Target Goals: Exercise Program Goal: Individual exercise prescription set using results from initial 6 min walk test and THRR while considering  patient's activity barriers and safety.   Exercise Prescription Goal: Initial exercise prescription builds to 30-45 minutes a day of aerobic activity, 2-3 days per week.  Home exercise guidelines will be given to patient during program as part of exercise prescription that the participant will acknowledge.  Activity Barriers & Risk Stratification: Activity Barriers & Cardiac Risk Stratification - 05/23/19 1119      Activity Barriers & Cardiac Risk Stratification   Activity Barriers  Balance Concerns;Back Problems;Other (comment)   DJD Neck and Back, "crippled when wakes up-sits with ice pack for 30 min and is better to move   Comments  Left foot goes to sleep when walks or stands for a long time.   Wll improve after more movement    Cardiac Risk Stratification  Moderate       6 Minute Walk: 6 Minute Walk    Row Name 05/28/19 1314         6 Minute Walk   Phase  Initial     Distance  1532 feet     Walk Time  6 minutes     # of Rest Breaks  0     MPH   2.9     METS  2.7     RPE  7     Perceived Dyspnea   0     VO2 Peak  9.35     Symptoms  No     Resting HR  58 bpm     Resting  BP  108/70     Resting Oxygen Saturation   98 %     Exercise Oxygen Saturation  during 6 min walk  94 %     Max Ex. HR  92 bpm     Max Ex. BP  132/66     2 Minute Post BP  114/70        Oxygen Initial Assessment:   Oxygen Re-Evaluation:   Oxygen Discharge (Final Oxygen Re-Evaluation):   Initial Exercise Prescription: Initial Exercise Prescription - 05/28/19 1300      Date of Initial Exercise RX and Referring Provider   Date  05/28/19    Referring Provider  Paraschos      Treadmill   MPH  2.5    Grade  0.5    Minutes  15    METs  3.09      Recumbant Bike   Level  3    RPM  60    Watts  20    Minutes  15    METs  2.7      NuStep   Level  4    SPM  80    Minutes  15    METs  2.7      Elliptical   Level  1    Speed  3    Minutes  15      Biostep-RELP   Level  4    SPM  50    Minutes  15    METs  3      Prescription Details   Frequency (times per week)  3    Duration  Progress to 30 minutes of continuous aerobic without signs/symptoms of physical distress      Intensity   THRR 40-80% of Max Heartrate  92-126    Ratings of Perceived Exertion  11-13    Perceived Dyspnea  0-4      Progression   Progression  Continue to progress workloads to maintain intensity without signs/symptoms of physical distress.      Resistance Training   Training Prescription  Yes    Weight  8 lb    Reps  10-15       Perform Capillary Blood Glucose checks as needed.  Exercise Prescription Changes: Exercise Prescription Changes    Row Name 06/08/19 1000 06/27/19 1300 07/10/19 1600         Response to Exercise   Blood Pressure (Admit)  132/78  128/70  134/76     Blood Pressure (Exercise)  142/74  152/76  128/70     Blood Pressure (Exit)  104/62  112/66  128/64     Heart Rate (Admit)  56 bpm  50 bpm  61 bpm     Heart Rate (Exercise)   90 bpm  99 bpm  102 bpm     Heart Rate (Exit)  66 bpm  60 bpm  73 bpm     Rating of Perceived Exertion (Exercise)  '11  13  12     '$ Symptoms  none  none  none     Duration  Continue with 30 min of aerobic exercise without signs/symptoms of physical distress.  Continue with 30 min of aerobic exercise without signs/symptoms of physical distress.  Continue with 30 min of aerobic exercise without signs/symptoms of physical distress.     Intensity  THRR unchanged  THRR unchanged  THRR unchanged       Progression   Progression  Continue to progress workloads to  maintain intensity without signs/symptoms of physical distress.  Continue to progress workloads to maintain intensity without signs/symptoms of physical distress.  Continue to progress workloads to maintain intensity without signs/symptoms of physical distress.     Average METs  2.9  4  4.11       Resistance Training   Training Prescription  Yes  Yes  Yes     Weight  10 lb  10 lb  10 lbs     Reps  10-15  10-15  10-15       Interval Training   Interval Training  No  -  No       Treadmill   MPH  2.5  2.5  2.8     Grade  0.5  0.5  1.5     Minutes  '15  15  15     '$ METs  3.09  3.09  3.72       Recumbant Bike   Level  '3  3  12     '$ RPM  60  60  -     Watts  -  66  76     Minutes  '15  15  15     '$ METs  2.7  4.3  4.62       Elliptical   Level  '1  1  1     '$ Speed  -  3  3     Minutes  '15  15  15       '$ Biostep-RELP   Level  '4  5  9     '$ SPM  50  50  -     Minutes  '15  15  15     '$ METs  '3  5  4       '$ Home Exercise Plan   Plans to continue exercise at  -  -  Home (comment) walking     Frequency  -  -  Add 2 additional days to program exercise sessions.     Initial Home Exercises Provided  -  -  06/14/19        Exercise Comments: Exercise Comments    Row Name 05/29/19 1054           Exercise Comments  First full day of exercise!  Patient was oriented to gym and equipment including functions, settings, policies, and procedures.   Patient's individual exercise prescription and treatment plan were reviewed.  All starting workloads were established based on the results of the 6 minute walk test done at initial orientation visit.  The plan for exercise progression was also introduced and progression will be customized based on patient's performance and goals.          Exercise Goals and Review: Exercise Goals    Row Name 05/28/19 1323             Exercise Goals   Increase Physical Activity  Yes       Expected Outcomes  Short Term: Attend rehab on a regular basis to increase amount of physical activity.;Long Term: Add in home exercise to make exercise part of routine and to increase amount of physical activity.;Long Term: Exercising regularly at least 3-5 days a week.       Increase Strength and Stamina  Yes       Intervention  Provide advice, education, support and counseling about physical activity/exercise needs.;Develop an individualized exercise prescription for aerobic and resistive training based on initial evaluation findings, risk stratification,  comorbidities and participant's personal goals.       Expected Outcomes  Short Term: Increase workloads from initial exercise prescription for resistance, speed, and METs.;Short Term: Perform resistance training exercises routinely during rehab and add in resistance training at home;Long Term: Improve cardiorespiratory fitness, muscular endurance and strength as measured by increased METs and functional capacity (6MWT)       Able to understand and use rate of perceived exertion (RPE) scale  Yes       Intervention  Provide education and explanation on how to use RPE scale       Expected Outcomes  Short Term: Able to use RPE daily in rehab to express subjective intensity level;Long Term:  Able to use RPE to guide intensity level when exercising independently       Knowledge and understanding of Target Heart Rate Range (THRR)  Yes       Intervention  Provide education and  explanation of THRR including how the numbers were predicted and where they are located for reference       Expected Outcomes  Short Term: Able to state/look up THRR;Short Term: Able to use daily as guideline for intensity in rehab;Long Term: Able to use THRR to govern intensity when exercising independently       Able to check pulse independently  Yes       Intervention  Provide education and demonstration on how to check pulse in carotid and radial arteries.;Review the importance of being able to check your own pulse for safety during independent exercise       Expected Outcomes  Short Term: Able to explain why pulse checking is important during independent exercise;Long Term: Able to check pulse independently and accurately       Understanding of Exercise Prescription  Yes       Intervention  Provide education, explanation, and written materials on patient's individual exercise prescription       Expected Outcomes  Short Term: Able to explain program exercise prescription;Long Term: Able to explain home exercise prescription to exercise independently          Exercise Goals Re-Evaluation : Exercise Goals Re-Evaluation    Row Name 05/29/19 1054 06/08/19 1006 06/14/19 1118 06/27/19 1322 07/10/19 1603     Exercise Goal Re-Evaluation   Exercise Goals Review  Increase Physical Activity;Increase Strength and Stamina;Able to understand and use rate of perceived exertion (RPE) scale;Able to understand and use Dyspnea scale;Knowledge and understanding of Target Heart Rate Range (THRR);Understanding of Exercise Prescription  Increase Physical Activity;Increase Strength and Stamina;Able to understand and use rate of perceived exertion (RPE) scale;Knowledge and understanding of Target Heart Rate Range (THRR);Able to check pulse independently;Understanding of Exercise Prescription  Increase Physical Activity;Increase Strength and Stamina;Understanding of Exercise Prescription  Increase Physical  Activity;Increase Strength and Stamina;Able to understand and use rate of perceived exertion (RPE) scale;Knowledge and understanding of Target Heart Rate Range (THRR);Able to check pulse independently;Understanding of Exercise Prescription  Increase Physical Activity;Increase Strength and Stamina;Understanding of Exercise Prescription   Comments  Reviewed RPE scale, THR and program prescription with pt today.  Pt voiced understanding and was given a copy of goals to take home.  Gershon Mussel has done very well so far.  The elliptical is most challengin for him - her reproted being very tired after the first session he did elliptical.  He was taken off of one of his BP meds and felt much more energy since then.  He is cleared by his Dr to use 10  lb weights.  Gershon Mussel is doing well in rehab.  He is enjoying come to class and getting back into exercise again.  He used to run and bike and now walks.  He used to be up to a 1-2 miles a day.Reviewed home exercise with pt today.  Pt plans to walking and using weights at home for exercise.  He also belongs  [employer] once it reopens. Reviewed THR, pulse, RPE, sign and symptoms, NTG use, and when to call 911 or MD.  Also discussed weather considerations and indoor options.  Pt voiced understanding.  Gershon Mussel is progressing well and has increased MET level.  he uses 10 lb weights for strength work.Gershon Mussel is doing well in rehab.  He is already up to 76 watts on the recumbent bike and level 9 on the BioStep.  We will continue to monitor his progress.   Expected Outcomes  Short: Use RPE daily to regulate intensity. Long: Follow program prescription in THR.  Short - continue to attend consistently Long - increase overall MET level  Short: Start to add home exercise back in.  Long: Continue to increase stamina.  Short - continue to progress workloads Long - increase overall MET level  Short: Continue to push on treadmill.  Long: Continue to exericse on off days.      Discharge Exercise  Prescription (Final Exercise Prescription Changes): Exercise Prescription Changes - 07/10/19 1600      Response to Exercise   Blood Pressure (Admit)  134/76    Blood Pressure (Exercise)  128/70    Blood Pressure (Exit)  128/64    Heart Rate (Admit)  61 bpm    Heart Rate (Exercise)  102 bpm    Heart Rate (Exit)  73 bpm    Rating of Perceived Exertion (Exercise)  12    Symptoms  none    Duration  Continue with 30 min of aerobic exercise without signs/symptoms of physical distress.    Intensity  THRR unchanged      Progression   Progression  Continue to progress workloads to maintain intensity without signs/symptoms of physical distress.    Average METs  4.11      Resistance Training   Training Prescription  Yes    Weight  10 lbs    Reps  10-15      Interval Training   Interval Training  No      Treadmill   MPH  2.8    Grade  1.5    Minutes  15    METs  3.72      Recumbant Bike   Level  12    Watts  76    Minutes  15    METs  4.62      Elliptical   Level  1    Speed  3    Minutes  15      Biostep-RELP   Level  9    Minutes  15    METs  4      Home Exercise Plan   Plans to continue exercise at  Home (comment)   walking   Frequency  Add 2 additional days to program exercise sessions.    Initial Home Exercises Provided  06/14/19       Nutrition:  Target Goals: Understanding of nutrition guidelines, daily intake of sodium '1500mg'$ , cholesterol '200mg'$ , calories 30% from fat and 7% or less from saturated fats, daily to have 5 or more servings of fruits  and vegetables.  Biometrics: Pre Biometrics - 05/28/19 1323      Pre Biometrics   Height  5' 8.25" (1.734 m)    Weight  204 lb (92.5 kg)    BMI (Calculated)  30.78    Single Leg Stand  2.4 seconds        Nutrition Therapy Plan and Nutrition Goals: Nutrition Therapy & Goals - 05/28/19 1200      Nutrition Therapy   Diet  low Na, HH diet    Protein (specify units)  75g    Fiber  30 grams    Whole Grain  Foods  3 servings    Saturated Fats  12 max. grams    Fruits and Vegetables  5 servings/day    Sodium  1.5 grams      Personal Nutrition Goals   Nutrition Goal  ST: have at least 2 servings of Pro per day and sneak in some more (like soymilk) LT: walk better and get stronger.    Comments  Spoke with pt and wife. B pt has cereal with almond milk and coffee (creamer/splenda). L salad with veggies olive oil and vinegar and some chickpeas (a couple of tbsp) or some peanut butter and fruit or tomato sandwich on homemade bread and dukes mayo or fruit and cottage cheese. D: chicken sald or tuna sald or buger or rice and beans; usually just steamed vegetables and mashed potatoes (smart balance with olive oil). Pt reports using canola oil in baking. Pt reports not liking meat too much, discussed other ways to eat consistent protein like switching to soymilk or adding more chickpeas to salad, or when having yogurt and fruit to have greek yogurt which is higher in protein. Pt has been watching Na, chosing low salt options. Discussed HH eating.      Intervention Plan   Intervention  Prescribe, educate and counsel regarding individualized specific dietary modifications aiming towards targeted core components such as weight, hypertension, lipid management, diabetes, heart failure and other comorbidities.;Nutrition handout(s) given to patient.    Expected Outcomes  Short Term Goal: Understand basic principles of dietary content, such as calories, fat, sodium, cholesterol and nutrients.;Short Term Goal: A plan has been developed with personal nutrition goals set during dietitian appointment.;Long Term Goal: Adherence to prescribed nutrition plan.       Nutrition Assessments: Nutrition Assessments - 05/24/19 1053      MEDFICTS Scores   Pre Score  20       Nutrition Goals Re-Evaluation: Nutrition Goals Re-Evaluation    [place] Name 07/10/19 1111             Goals   Nutrition Goal  ST: have at least 2  servings of Pro per day and sneak in some more (like soymilk) LT: walk better and get stronger.       Comment  Pt reports that his energy has been abou tthe same and that his diet is still going well, added some more plant based protein like we discussed such as chickpeas. Pt reports his denentia is making him feel less confident, but he is still able to do everything he wants to do.       Expected Outcome  ST: have at least 2 servings of Pro per day and sneak in some more (like soymilk) LT: walk better and get stronger.          Nutrition Goals Discharge (Final Nutrition Goals Re-Evaluation): Nutrition Goals Re-Evaluation - 07/10/19 1111      Goals  Nutrition Goal  ST: have at least 2 servings of Pro per day and sneak in some more (like soymilk) LT: walk better and get stronger.    Comment  Pt reports that his energy has been abou tthe same and that his diet is still going well, added some more plant based protein like we discussed such as chickpeas. Pt reports his denentia is making him feel less confident, but he is still able to do everything he wants to do.    Expected Outcome  ST: have at least 2 servings of Pro per day and sneak in some more (like soymilk) LT: walk better and get stronger.       Psychosocial: Target Goals: Acknowledge presence or absence of significant depression and/or stress, maximize coping skills, provide positive support system. Participant is able to verbalize types and ability to use techniques and skills needed for reducing stress and depression.   Initial Review & Psychosocial Screening: Initial Psych Review & Screening - 05/23/19 1122      Initial Review   Current issues with  History of Depression;Current Psychotropic Meds    Source of Stress Concerns  Chronic Illness    Comments  Alzihemers  slow progression- Short to term memory is starting to degrade.  ABle to follow directions, may not remeber he was in class after he goes home      [place]?  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      Screening Interventions   Interventions  Encouraged to exercise    Expected Outcomes  Short Term goal: Utilizing psychosocial counselor, staff and physician to assist with identification of specific Stressors or current issues interfering with healing process. Setting desired goal for each stressor or current issue identified.;Long Term Goal: Stressors or current issues are controlled or eliminated.;Short Term goal: Identification and review with participant of any Quality of Life or Depression concerns found by scoring the questionnaire.;Long Term goal: The participant improves quality of Life and PHQ9 Scores as seen by post scores and/or verbalization of changes       Quality of Life Scores:  Quality of Life - 05/24/19 1052      Quality of Life   Select  Quality of Life      Quality of Life Scores   Health/Function Pre  20 %    Socioeconomic Pre  24.31 %    Psych/Spiritual Pre  23.14 %    Family Pre  23.4 %    GLOBAL Pre  22.1 %      Scores of 19 and below usually indicate a poorer quality of life in these areas.  A difference of  2-3 points is a clinically meaningful difference.  A difference of 2-3 points in the total score of the Quality of Life Index has been associated with significant improvement in overall quality of life, self-image, physical symptoms, and general health in studies assessing change in quality of life.  PHQ-9: Recent Review Flowsheet Data    Depression screen [facility] 2/9 06/28/2019 05/28/2019   Decreased Interest 0 2   Down, Depressed, Hopeless 0 1   PHQ - 2 Score 0 3   Altered sleeping 0 1   Tired, decreased energy 1 3   Change in appetite 0  0   Feeling bad or failure about yourself  0 0   Trouble  concentrating 0 1   Moving slowly or fidgety/restless 0 0   Suicidal thoughts 0 0   PHQ-9 Score 1 8   Difficult doing work/chores Not difficult at all Somewhat difficult     Interpretation of Total Score  Total Score Depression Severity:  1-4 = Minimal depression, 5-9 = Mild depression, 10-14 = Moderate depression, 15-19 = Moderately severe depression, 20-27 = Severe depression   Psychosocial Evaluation and Intervention:   Psychosocial Re-Evaluation: Psychosocial Re-Evaluation    [place] Name 06/14/19 1119 07/06/19 1054           Psychosocial Re-Evaluation   Current issues with  Current Stress Concerns;Current Depression;Current Psychotropic Meds  Current Depression;Current Sleep Concerns;History of Depression;Current Psychotropic Meds;Current Stress Concerns      Comments  Gershon Mussel is doing well in rehab. He is on prozac and doing well.  He has good day and bad days. He views each day as a gift.  He is hoping to make it to 29 as his dad died at 51.  He sleeps pretty well and uses xanax to help.  He was using melatonin but Dr. Sabra Heck took him off.  If he doesn't take his Xanax he wakes up at 3am.  He is still waking up at night sometimes around 3am. He states he is feeling good and wants to make it to his 78th birthday. 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      Expected Outcomes  Short: Continue to work through each day Long: Continue to stay positive.  Short: continue to exersice for his mental health and make it to his 78th birthday. Long:maintain an exercise routine post HeartTrack to maintain mental health and keep stress at a minumum.      Interventions  Encouraged to attend Cardiac Rehabilitation for the exercise  Encouraged to attend Cardiac Rehabilitation for the exercise      Continue Psychosocial Services   Follow up required by staff  Follow up required by staff         Psychosocial Discharge (Final Psychosocial Re-Evaluation): Psychosocial Re-Evaluation  - 07/06/19 1054      Psychosocial Re-Evaluation   Current issues with  Current Depression;Current Sleep Concerns;History of Depression;Current Psychotropic Meds;Current Stress Concerns    Comments  He is still waking up at night sometimes around 3am. He states he is feeling good and wants to make it to his 78th birthday. 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    Expected Outcomes  Short: continue to exersice for his mental health and make it to his 78th birthday. Long:maintain an exercise routine post HeartTrack to maintain mental health and keep stress at a minumum.    Interventions  Encouraged to attend Cardiac Rehabilitation for the exercise    Continue Psychosocial Services   Follow up required by staff       Vocational Rehabilitation: Provide vocational rehab assistance to qualifying candidates.   Vocational Rehab Evaluation & Intervention: Vocational Rehab - 05/23/19 1129      Initial Vocational Rehab Evaluation & Intervention   Assessment shows need for Vocational Rehabilitation  No       Education: Education Goals: Education classes will be provided on a variety of topics geared toward better understanding of heart health and risk factor modification. Participant will state understanding/return demonstration of topics presented as noted by education test scores.  Learning Barriers/Preferences: Learning Barriers/Preferences - 05/23/19 1128      Learning Barriers/Preferences   Learning Barriers  --   Memory   Learning Preferences  Individual Instruction;Written Material;Video       Education Topics:  AED/CPR: - Group verbal and written instruction with the use of models to demonstrate the basic use of the AED with the basic ABC's of resuscitation.   General Nutrition Guidelines/Fats and Fiber: -Group instruction provided by verbal, written material, models and posters to present the general guidelines for heart healthy nutrition. Gives an  explanation and review of dietary fats and fiber.   Controlling Sodium/Reading Food Labels: -Group verbal and written material supporting the discussion of sodium use in heart healthy nutrition. Review and explanation with models, verbal and written materials for utilization of the food label.   Exercise Physiology & General Exercise Guidelines: - Group verbal and written instruction with models to review the exercise physiology of the cardiovascular system and associated critical values. Provides general exercise guidelines with specific guidelines to those with heart or lung disease.    Aerobic Exercise & Resistance Training: - Gives group verbal and written instruction on the various components of exercise. Focuses on aerobic and resistive training programs and the benefits of this training and how to safely progress through these programs..   Flexibility, Balance, Mind/Body Relaxation: Provides group verbal/written instruction on the benefits of flexibility and balance training, including mind/body exercise modes such as yoga, pilates and tai chi.  Demonstration and skill practice provided.   Stress and Anxiety: - Provides group verbal and written instruction about the health risks of elevated stress and causes of high stress.  Discuss the correlation between heart/lung disease and anxiety and treatment options. Review healthy ways to manage with stress and anxiety.   Depression: - Provides group verbal and written instruction on the correlation between heart/lung disease and depressed mood, treatment options, and the stigmas associated with seeking treatment.   Anatomy & Physiology of the Heart: - Group verbal and written instruction and models provide basic cardiac anatomy and physiology, with the coronary electrical and arterial systems. Review of Valvular disease and Heart Failure   Cardiac Procedures: - Group verbal and written instruction to review commonly prescribed  medications for heart disease. Reviews the medication, class of the drug, and side effects. Includes the steps to properly store meds and maintain the prescription regimen. (beta blockers and nitrates)   Cardiac Medications I: - Group verbal and written instruction to review commonly prescribed medications for heart disease. Reviews the medication, class of the drug, and side effects. Includes the steps to properly store meds and maintain the prescription regimen.   Cardiac Medications II: -Group verbal and written instruction to review commonly prescribed medications for heart disease. Reviews the medication, class of the drug, and side effects. (all other drug classes)    Go Sex-Intimacy & Heart Disease, Get SMART - Goal Setting: - Group verbal and written instruction through game format to discuss heart disease and the return to sexual intimacy. Provides group verbal and written material to discuss and apply goal setting through the application of the S.M.A.R.T. Method.   Other Matters of the Heart: - Provides group verbal, written materials and models to describe Stable Angina and Peripheral Artery. Includes description of the disease process and treatment options available to the cardiac patient.   Exercise & Equipment Safety: - Individual verbal instruction and demonstration of equipment use and safety with use of the equipment.   Cardiac Rehab from  05/28/2019 in [facility] Cardiac and Pulmonary Rehab  Date  05/28/19  Educator  AS  Instruction Review Code  1- Verbalizes Understanding      Infection Prevention: - Provides verbal and written material to individual with discussion of infection control including proper hand washing and proper equipment cleaning during exercise session.   Cardiac Rehab from 05/28/2019 in [facility] Cardiac and Pulmonary Rehab  Date  05/28/19  Educator  AS  Instruction Review Code  1- Verbalizes Understanding      Falls Prevention: - Provides verbal and  written material to individual with discussion of falls prevention and safety.   Cardiac Rehab from 05/28/2019 in [facility] Cardiac and Pulmonary Rehab  Date  05/28/19  Educator  AS  Instruction Review Code  1- Verbalizes Understanding      Diabetes: - Individual verbal and written instruction to review signs/symptoms of diabetes, desired ranges of glucose level fasting, after meals and with exercise. Acknowledge that pre and post exercise glucose checks will be done for 3 sessions at entry of program.   Know Your Numbers and Risk Factors: -Group verbal and written instruction about important numbers in your health.  Discussion of what are risk factors and how they play a role in the disease process.  Review of Cholesterol, Blood Pressure, Diabetes, and BMI and the role they play in your overall health.   Sleep Hygiene: -Provides group verbal and written instruction about how sleep can affect your health.  Define sleep hygiene, discuss sleep cycles and impact of sleep habits. Review good sleep hygiene tips.    Other: -Provides group and verbal instruction on various topics (see comments)   Knowledge Questionnaire Score: Knowledge Questionnaire Score - 05/24/19 1052      Knowledge Questionnaire Score   Pre Score  26/26       Core Components/Risk Factors/Patient Goals at Admission: Personal Goals and Risk Factors at Admission - 05/28/19 1324      Core Components/Risk Factors/Patient Goals on Admission    Weight Management  Yes    Admit Weight  204 lb (92.5 kg)    Hypertension  Yes    Intervention  Provide education on lifestyle modifcations including regular physical activity/exercise, weight management, moderate sodium restriction and increased consumption of fresh fruit, vegetables, and low fat dairy, alcohol moderation, and smoking cessation.;Monitor prescription use compliance.    Expected Outcomes  Short Term: Continued assessment and intervention until BP is < 140/9m HG in  hypertensive participants. < 130/859mHG in hypertensive participants with diabetes, heart failure or chronic kidney disease.;Long Term: Maintenance of blood pressure at goal levels.    Lipids  Yes    Intervention  Provide education and support for participant on nutrition & aerobic/resistive exercise along with prescribed medications to achieve LDL '70mg'$ , HDL >'40mg'$ .    Expected Outcomes  Short Term: Participant states understanding of desired cholesterol values and is compliant with medications prescribed. Participant is following exercise prescription and nutrition guidelines.;Long Term: Cholesterol controlled with medications as prescribed, with individualized exercise RX and with personalized nutrition plan. Value goals: LDL < '70mg'$ , HDL > 40 mg.       Core Components/Risk Factors/Patient Goals Review:  Goals and Risk Factor Review    Row Name 06/14/19 1121 07/06/19 1058           Core Components/Risk Factors/Patient Goals Review   Personal Goals Review  Weight Management/Obesity;Hypertension;Lipids  Weight Management/Obesity;Hypertension;Lipids      Review  Tom's weight has been steady. He is starting to lose now  thanks to some diet tweaks.  His blood pressures have improved and he is feeling better overall.  He checks it every morning at home.  He has not noted any problems with his medications.  Tom wants to lose some weight and be as healthy as he can be. His blood pressure has been up and down lately. His blood pressure has been up and down since some of his medications changes. He is checking his blood pressure at home. He is taking a statin to help with his lipds. He will be on Lipitor for a year.      Expected Outcomes  Short: Continue to work on weight. Long: Continue to monitor risk factors.  Short: lose 5 pounds in the next two weeks. Long: Maintain weight loss post HeartTrack independently.         Core Components/Risk Factors/Patient Goals at Discharge (Final Review):  Goals and  Risk Factor Review - 07/06/19 1058      Core Components/Risk Factors/Patient Goals Review   Personal Goals Review  Weight Management/Obesity;Hypertension;Lipids    Review  Gershon Mussel wants to lose some weight and be as healthy as he can be. His blood pressure has been up and down lately. His blood pressure has been up and down since some of his medications changes. He is checking his blood pressure at home. He is taking a statin to help with his lipds. He will be on Lipitor for a year.    Expected Outcomes  Short: lose 5 pounds in the next two weeks. Long: Maintain weight loss post HeartTrack independently.       ITP Comments: ITP Comments    Row Name 05/23/19 1137 05/28/19 1311 05/30/19 0554 06/27/19 0556 07/25/19 1336   ITP Comments  Virtual CAll for Cardiac Rehab Orientation completed. Has appt on 8/10 for EP/RD Evals and gym orientaiton.  Documentation of diagnosis can be found in [facility] 04/19/19  Completed 6MWT and initial nutrition evaluation  30 Day Review Completed today. Continue with ITP unless changed by Medical Director review.   New to program  30 Day review. Continue with ITP unless directed changes per Medical Director review.  30 day review completed. ITP sent to Dr. Emily Filbert, Medical Director of Cardiac and Pulmonary Rehab. Continue with ITP unless changes are made by physician.  Department closed starting 10/2 until further notice by infection prevention and Health at Work teams for [place].      Comments: 30 day review

## 2019-07-31 ENCOUNTER — Encounter: Payer: Medicare Other | Admitting: *Deleted

## 2019-07-31 ENCOUNTER — Other Ambulatory Visit: Payer: Self-pay

## 2019-07-31 DIAGNOSIS — I213 ST elevation (STEMI) myocardial infarction of unspecified site: Secondary | ICD-10-CM | POA: Diagnosis not present

## 2019-07-31 NOTE — Progress Notes (Signed)
Daily Session Note  Patient Details  Name: BAYLIN GAMBLIN MRN: [id number] Date of Birth: [birth year] Referring Provider:     Cardiac Rehab from 05/28/2019 in [facility] Cardiac and Pulmonary Rehab  Referring Provider  Paraschos      Encounter Date: 07/31/2019  Check In: Session Check In - 07/31/19 1057      Check-In   Supervising physician immediately available to respond to emergencies  See telemetry face sheet for immediately available ER MD    Location  [facility]-Cardiac & Pulmonary Rehab    Staff Present  Heath Lark, RN, BSN, CCRP;Laureen [place], BS, RRT, CPFT;Joseph Pound [place]    Virtual Visit  No    Medication changes reported      No    Fall or balance concerns reported     No    Warm-up and Cool-down  Performed on first and last piece of equipment    Resistance Training Performed  Yes    VAD Patient?  No    PAD/SET Patient?  No      Pain Assessment   Currently in Pain?  No/denies          Social History   Tobacco Use  Smoking Status Former Smoker  . Quit date: 08/02/1962  . Years since quitting: 57.0  Smokeless Tobacco Never Used    Goals Met:  Independence with exercise equipment Exercise tolerated well No report of cardiac concerns or symptoms  Goals Unmet:  Not Applicable  Comments: Pt able to follow exercise prescription today without complaint.  Will continue to monitor for progression.    Dr. Emily Filbert is Medical Director for [place] and [facility].

## 2019-08-02 ENCOUNTER — Other Ambulatory Visit: Payer: Self-pay

## 2019-08-02 ENCOUNTER — Encounter: Payer: Medicare Other | Admitting: *Deleted

## 2019-08-02 DIAGNOSIS — I213 ST elevation (STEMI) myocardial infarction of unspecified site: Secondary | ICD-10-CM

## 2019-08-02 NOTE — Progress Notes (Signed)
Daily Session Note  Patient Details  Name: Marvin Jefferson MRN: [id number] Date of Birth: April 02, 1941 Referring Provider:     Cardiac Rehab from 05/28/2019 in [facility] Cardiac and Pulmonary Rehab  Referring Provider  Paraschos      Encounter Date: 08/02/2019  Check In: Session Check In - 08/02/19 1051      Check-In   Supervising physician immediately available to respond to emergencies  See telemetry face sheet for immediately available ER MD    Location  [facility]-Cardiac & Pulmonary Rehab    Staff Present  Renita Papa, RN BSN;Jessica Luan [place], MA, RCEP, CCRP, CCET;Joseph [place] RCP,RRT,BSRT    Virtual Visit  No    Medication changes reported      No    Fall or balance concerns reported     No    Warm-up and Cool-down  Performed on first and last piece of equipment    Resistance Training Performed  Yes    VAD Patient?  No    PAD/SET Patient?  No      Pain Assessment   Currently in Pain?  No/denies          Social History   Tobacco Use  Smoking Status Former Smoker  . Quit date: 08/02/1962  . Years since quitting: 57.0  Smokeless Tobacco Never Used    Goals Met:  Independence with exercise equipment Exercise tolerated well No report of cardiac concerns or symptoms Strength training completed today  Goals Unmet:  Not Applicable  Comments: Pt able to follow exercise prescription today without complaint.  Will continue to monitor for progression.    Dr. Emily Filbert is Medical Director for [place] and [facility].

## 2019-08-03 ENCOUNTER — Encounter: Payer: Medicare Other | Admitting: *Deleted

## 2019-08-03 DIAGNOSIS — I213 ST elevation (STEMI) myocardial infarction of unspecified site: Secondary | ICD-10-CM | POA: Diagnosis not present

## 2019-08-03 NOTE — Progress Notes (Signed)
Daily Session Note  Patient Details  Name: Marvin Jefferson MRN: [id number] Date of Birth: 03-08-1941 Referring Provider:     Cardiac Rehab from 05/28/2019 in [facility] Cardiac and Pulmonary Rehab  Referring Provider  Paraschos      Encounter Date: 08/03/2019  Check In: Session Check In - 08/03/19 1044      Check-In   Supervising physician immediately available to respond to emergencies  See telemetry face sheet for immediately available ER MD    Location  [facility]-Cardiac & Pulmonary Rehab    Staff Present  Renita Papa, RN Vickki Hearing, BA, ACSM CEP, Exercise Physiologist;Joseph Tessie Fass RCP,RRT,BSRT    Virtual Visit  No    Medication changes reported      No    Fall or balance concerns reported     No    Warm-up and Cool-down  Performed on first and last piece of equipment    Resistance Training Performed  Yes    VAD Patient?  No    PAD/SET Patient?  No      Pain Assessment   Currently in Pain?  No/denies          Social History   Tobacco Use  Smoking Status Former Smoker  . Quit date: 08/02/1962  . Years since quitting: 57.0  Smokeless Tobacco Never Used    Goals Met:  Independence with exercise equipment Exercise tolerated well No report of cardiac concerns or symptoms Strength training completed today  Goals Unmet:  Not Applicable  Comments: Pt able to follow exercise prescription today without complaint.  Will continue to monitor for progression.    Dr. Emily Filbert is Medical Director for [place] and [facility].

## 2019-08-07 ENCOUNTER — Encounter: Payer: Medicare Other | Admitting: *Deleted

## 2019-08-07 ENCOUNTER — Other Ambulatory Visit: Payer: Self-pay

## 2019-08-07 DIAGNOSIS — I213 ST elevation (STEMI) myocardial infarction of unspecified site: Secondary | ICD-10-CM | POA: Diagnosis not present

## 2019-08-07 NOTE — Progress Notes (Signed)
Daily Session Note  Patient Details  Name: DAILEY BUCCHERI MRN: [id number] Date of Birth: June 12, 1941 Referring Provider:     Cardiac Rehab from 05/28/2019 in [facility] Cardiac and Pulmonary Rehab  Referring Provider  Paraschos      Encounter Date: 08/07/2019  Check In: Session Check In - 08/07/19 1119      Check-In   Supervising physician immediately available to respond to emergencies  See telemetry face sheet for immediately available ER MD    Location  [facility]-Cardiac & Pulmonary Rehab    Staff Present  Heath Lark, RN, BSN, CCRP;Amanda Sommer, BA, ACSM CEP, Exercise Physiologist;Joseph Hood RCP,RRT,BSRT    Virtual Visit  No    Medication changes reported      No    Fall or balance concerns reported     No    Warm-up and Cool-down  Performed on first and last piece of equipment    Resistance Training Performed  Yes    VAD Patient?  No    PAD/SET Patient?  No          Social History   Tobacco Use  Smoking Status Former Smoker  . Quit date: 08/02/1962  . Years since quitting: 57.0  Smokeless Tobacco Never Used    Goals Met:  Independence with exercise equipment Exercise tolerated well No report of cardiac concerns or symptoms  Goals Unmet:  Not Applicable  Comments: Pt able to follow exercise prescription today without complaint.  Will continue to monitor for progression.    Dr. Emily Filbert is Medical Director for [place] and [facility].

## 2019-08-09 ENCOUNTER — Encounter: Payer: Medicare Other | Admitting: *Deleted

## 2019-08-09 ENCOUNTER — Other Ambulatory Visit: Payer: Self-pay

## 2019-08-09 DIAGNOSIS — I213 ST elevation (STEMI) myocardial infarction of unspecified site: Secondary | ICD-10-CM | POA: Diagnosis not present

## 2019-08-09 NOTE — Progress Notes (Signed)
Daily Session Note  Patient Details  Name: Marvin Jefferson MRN: [id number] Date of Birth: 06-13-1941 Referring Provider:     Cardiac Rehab from 05/28/2019 in [facility] Cardiac and Pulmonary Rehab  Referring Provider  Paraschos      Encounter Date: 08/09/2019  Check In: Session Check In - 08/09/19 1142      Check-In   Supervising physician immediately available to respond to emergencies  See telemetry face sheet for immediately available ER MD    Location  [facility]-Cardiac & Pulmonary Rehab    Staff Present  Heath Lark, RN, BSN, CCRP;Jeanna Durrell BS, Exercise Physiologist;Joseph Hood RCP,RRT,BSRT    Virtual Visit  No    Medication changes reported      No    Fall or balance concerns reported     No    Warm-up and Cool-down  Performed on first and last piece of equipment    Resistance Training Performed  Yes    VAD Patient?  No    PAD/SET Patient?  No      Pain Assessment   Currently in Pain?  No/denies          Social History   Tobacco Use  Smoking Status Former Smoker  . Quit date: 08/02/1962  . Years since quitting: 57.0  Smokeless Tobacco Never Used    Goals Met:  Independence with exercise equipment Exercise tolerated well No report of cardiac concerns or symptoms  Goals Unmet:  Not Applicable  Comments: Pt able to follow exercise prescription today without complaint.  Will continue to monitor for progression.    Dr. Emily Filbert is Medical Director for [place] and [facility].

## 2019-08-10 ENCOUNTER — Encounter: Payer: Medicare Other | Admitting: *Deleted

## 2019-08-10 DIAGNOSIS — I213 ST elevation (STEMI) myocardial infarction of unspecified site: Secondary | ICD-10-CM | POA: Diagnosis not present

## 2019-08-10 NOTE — Progress Notes (Signed)
Daily Session Note  Patient Details  Name: Marvin Jefferson MRN: [id number] Date of Birth: January 30, 1941 Referring Provider:     Cardiac Rehab from 05/28/2019 in [facility] Cardiac and Pulmonary Rehab  Referring Provider  Paraschos      Encounter Date: 08/10/2019  Check In: Session Check In - 08/10/19 1044      Check-In   Supervising physician immediately available to respond to emergencies  See telemetry face sheet for immediately available ER MD    Location  [facility]-Cardiac & Pulmonary Rehab    Staff Present  Heath Lark, RN, BSN, CCRP;Amanda Sommer, BA, ACSM CEP, Exercise Physiologist;Jessica [place], MA, RCEP, CCRP, CCET    Virtual Visit  No    Medication changes reported      No    Fall or balance concerns reported     No    Warm-up and Cool-down  Performed on first and last piece of equipment    Resistance Training Performed  Yes    VAD Patient?  No    PAD/SET Patient?  No      Pain Assessment   Currently in Pain?  No/denies          Social History   Tobacco Use  Smoking Status Former Smoker  . Quit date: 08/02/1962  . Years since quitting: 57.0  Smokeless Tobacco Never Used    Goals Met:  Independence with exercise equipment Exercise tolerated well Personal goals reviewed No report of cardiac concerns or symptoms  Goals Unmet:  Not Applicable  Comments: Pt able to follow exercise prescription today without complaint.  Will continue to monitor for progression.    Dr. Emily Filbert is Medical Director for [place] and [facility].

## 2019-08-14 ENCOUNTER — Encounter: Payer: Medicare Other | Admitting: *Deleted

## 2019-08-14 ENCOUNTER — Other Ambulatory Visit: Payer: Self-pay

## 2019-08-14 DIAGNOSIS — I213 ST elevation (STEMI) myocardial infarction of unspecified site: Secondary | ICD-10-CM | POA: Diagnosis not present

## 2019-08-14 NOTE — Progress Notes (Signed)
Daily Session Note  Patient Details  Name: Marvin Jefferson MRN: [id number] Date of Birth: 1941/02/19 Referring Provider:     Cardiac Rehab from 05/28/2019 in [facility] Cardiac and Pulmonary Rehab  Referring Provider  Paraschos      Encounter Date: 08/14/2019  Check In: Session Check In - 08/14/19 1052      Check-In   Supervising physician immediately available to respond to emergencies  See telemetry face sheet for immediately available ER MD    Location  [facility]-Cardiac & Pulmonary Rehab    Staff Present  Darel Hong, RN BSN;Joseph Foy [place], [place], ACSM CEP, Exercise Physiologist    Virtual Visit  No    Medication changes reported      No    Fall or balance concerns reported     No    Warm-up and Cool-down  Performed on first and last piece of equipment    Resistance Training Performed  Yes    VAD Patient?  No    PAD/SET Patient?  No      Pain Assessment   Currently in Pain?  No/denies          Social History   Tobacco Use  Smoking Status Former Smoker  . Quit date: 08/02/1962  . Years since quitting: 57.0  Smokeless Tobacco Never Used    Goals Met:  Independence with exercise equipment Exercise tolerated well No report of cardiac concerns or symptoms Strength training completed today  Goals Unmet:  Not Applicable  Comments: Pt able to follow exercise prescription today without complaint.  Will continue to monitor for progression.    Dr. Emily Filbert is Medical Director for [place] and [facility].

## 2019-08-16 ENCOUNTER — Other Ambulatory Visit: Payer: Self-pay

## 2019-08-16 ENCOUNTER — Encounter: Payer: Medicare Other | Admitting: *Deleted

## 2019-08-16 DIAGNOSIS — I213 ST elevation (STEMI) myocardial infarction of unspecified site: Secondary | ICD-10-CM | POA: Diagnosis not present

## 2019-08-16 NOTE — Progress Notes (Signed)
Daily Session Note  Patient Details  Name: SHAWNEE HIGHAM MRN: [id number] Date of Birth: 05-Feb-1941 Referring Provider:     Cardiac Rehab from 05/28/2019 in [facility] Cardiac and Pulmonary Rehab  Referring Provider  Paraschos      Encounter Date: 08/16/2019  Check In: Session Check In - 08/16/19 1040      Check-In   Supervising physician immediately available to respond to emergencies  See telemetry face sheet for immediately available ER MD    Location  [facility]-Cardiac & Pulmonary Rehab    Staff Present  Renita Papa, RN Vickki Hearing, BA, ACSM CEP, Exercise Physiologist;Jeanna Durrell BS, Exercise Physiologist    Virtual Visit  No    Medication changes reported      No    Warm-up and Cool-down  Performed on first and last piece of equipment    Resistance Training Performed  Yes    VAD Patient?  No    PAD/SET Patient?  No      Pain Assessment   Currently in Pain?  No/denies          Social History   Tobacco Use  Smoking Status Former Smoker  . Quit date: 08/02/1962  . Years since quitting: 57.0  Smokeless Tobacco Never Used    Goals Met:  Independence with exercise equipment Exercise tolerated well No report of cardiac concerns or symptoms Strength training completed today  Goals Unmet:  Not Applicable  Comments: Pt able to follow exercise prescription today without complaint.  Will continue to monitor for progression.    Dr. Emily Filbert is Medical Director for [place] [facility] and [facility].

## 2019-08-17 ENCOUNTER — Encounter: Payer: Medicare Other | Admitting: *Deleted

## 2019-08-17 DIAGNOSIS — I213 ST elevation (STEMI) myocardial infarction of unspecified site: Secondary | ICD-10-CM

## 2019-08-17 NOTE — Progress Notes (Signed)
Daily Session Note  Patient Details  Name: Marvin Jefferson MRN: [id number] Date of Birth: 03-18-1941 Referring Provider:     Cardiac Rehab from 05/28/2019 in [facility] Cardiac and Pulmonary Rehab  Referring Provider  Paraschos      Encounter Date: 08/17/2019  Check In: Session Check In - 08/17/19 1043      Check-In   Supervising physician immediately available to respond to emergencies  See telemetry face sheet for immediately available ER MD    Location  [facility]-Cardiac & Pulmonary Rehab    Staff Present  Renita Papa, RN BSN;Jessica [place], MA, RCEP, CCRP, Rafael Gonzalez, [place], ACSM CEP, Exercise Physiologist    Virtual Visit  No    Medication changes reported      No    Fall or balance concerns reported     No    Warm-up and Cool-down  Performed on first and last piece of equipment    Resistance Training Performed  Yes    VAD Patient?  No    PAD/SET Patient?  No      Pain Assessment   Currently in Pain?  No/denies          Social History   Tobacco Use  Smoking Status Former Smoker  . Quit date: 08/02/1962  . Years since quitting: 57.0  Smokeless Tobacco Never Used    Goals Met:  Independence with exercise equipment Exercise tolerated well No report of cardiac concerns or symptoms Strength training completed today  Goals Unmet:  Not Applicable  Comments: Pt able to follow exercise prescription today without complaint.  Will continue to monitor for progression.    Dr. Emily Filbert is Medical Director for [place] and [facility].

## 2019-08-21 ENCOUNTER — Encounter: Payer: Medicare Other | Attending: Cardiology | Admitting: *Deleted

## 2019-08-21 ENCOUNTER — Other Ambulatory Visit: Payer: Self-pay

## 2019-08-21 DIAGNOSIS — Z79899 Other long term (current) drug therapy: Secondary | ICD-10-CM | POA: Insufficient documentation

## 2019-08-21 DIAGNOSIS — F329 Major depressive disorder, single episode, unspecified: Secondary | ICD-10-CM | POA: Insufficient documentation

## 2019-08-21 DIAGNOSIS — Z7982 Long term (current) use of aspirin: Secondary | ICD-10-CM | POA: Diagnosis not present

## 2019-08-21 DIAGNOSIS — I10 Essential (primary) hypertension: Secondary | ICD-10-CM | POA: Insufficient documentation

## 2019-08-21 DIAGNOSIS — G8929 Other chronic pain: Secondary | ICD-10-CM | POA: Diagnosis not present

## 2019-08-21 DIAGNOSIS — I213 ST elevation (STEMI) myocardial infarction of unspecified site: Secondary | ICD-10-CM | POA: Diagnosis present

## 2019-08-21 DIAGNOSIS — M549 Dorsalgia, unspecified: Secondary | ICD-10-CM | POA: Diagnosis not present

## 2019-08-21 DIAGNOSIS — F419 Anxiety disorder, unspecified: Secondary | ICD-10-CM | POA: Diagnosis not present

## 2019-08-21 DIAGNOSIS — Z7902 Long term (current) use of antithrombotics/antiplatelets: Secondary | ICD-10-CM | POA: Insufficient documentation

## 2019-08-21 DIAGNOSIS — Z87891 Personal history of nicotine dependence: Secondary | ICD-10-CM | POA: Insufficient documentation

## 2019-08-21 DIAGNOSIS — F0390 Unspecified dementia without behavioral disturbance: Secondary | ICD-10-CM | POA: Insufficient documentation

## 2019-08-21 NOTE — Progress Notes (Signed)
Daily Session Note  Patient Details  Name: Marvin Jefferson MRN: [id number] Date of Birth: 09-01-1941 Referring Provider:     Cardiac Rehab from 05/28/2019 in [facility] Cardiac and Pulmonary Rehab  Referring Provider  Paraschos      Encounter Date: 08/21/2019  Check In: Session Check In - 08/21/19 1057      Check-In   Supervising physician immediately available to respond to emergencies  See telemetry face sheet for immediately available ER MD    Location  [facility]-Cardiac & Pulmonary Rehab    Staff Present  Heath Lark, RN, BSN, CCRP;Amanda Sommer, BA, ACSM CEP, Exercise Physiologist;Joseph Hood RCP,RRT,BSRT    Virtual Visit  No    Medication changes reported      No    Fall or balance concerns reported     No    Warm-up and Cool-down  Performed on first and last piece of equipment    Resistance Training Performed  Yes    VAD Patient?  No    PAD/SET Patient?  No      Pain Assessment   Currently in Pain?  No/denies          Social History   Tobacco Use  Smoking Status Former Smoker  . Quit date: 08/02/1962  . Years since quitting: 57.0  Smokeless Tobacco Never Used    Goals Met:  Independence with exercise equipment Exercise tolerated well No report of cardiac concerns or symptoms  Goals Unmet:  Not Applicable  Comments: Pt able to follow exercise prescription today without complaint.  Will continue to monitor for progression.    Dr. Emily Filbert is Medical Director for [facility] and [facility].

## 2019-08-22 ENCOUNTER — Encounter: Payer: Self-pay | Admitting: *Deleted

## 2019-08-22 DIAGNOSIS — I213 ST elevation (STEMI) myocardial infarction of unspecified site: Secondary | ICD-10-CM

## 2019-08-22 NOTE — Progress Notes (Signed)
Cardiac Individual Treatment Plan  Patient Details  Name: Marvin Jefferson MRN: [id number] Date of Birth: 1940/11/12 Referring Provider:     Cardiac Rehab from 05/28/2019 in [facility] Cardiac and Pulmonary Rehab  Referring Provider  Paraschos      Initial Encounter Date:    Cardiac Rehab from 05/28/2019 in [facility] Cardiac and Pulmonary Rehab  Date  05/28/19      Visit Diagnosis: ST elevation myocardial infarction (STEMI), unspecified artery ([place])  Patient's Home Medications on Admission:  Current Outpatient Medications:  .  acetaminophen (TYLENOL) 500 MG tablet, Take 1,000 mg by mouth every 6 (six) hours as needed for mild pain. , Disp: , Rfl:  .  ALPRAZolam (XANAX) 0.25 MG tablet, Take 0.25 mg by mouth 2 (two) times daily as needed (dizziness). , Disp: , Rfl:  .  amLODipine (NORVASC) 5 MG tablet, Take by mouth., Disp: , Rfl:  .  aspirin 81 MG chewable tablet, Chew 1 tablet (81 mg total) by mouth daily., Disp: 30 tablet, Rfl: 0 .  atorvastatin (LIPITOR) 80 MG tablet, Take 1 tablet (80 mg total) by mouth daily at 6 PM., Disp: 30 tablet, Rfl: 0 .  Cholecalciferol (VITAMIN D3) 2000 units capsule, Take 2,000 Units by mouth daily. , Disp: , Rfl:  .  clopidogrel (PLAVIX) 75 MG tablet, Take 75 mg by mouth daily., Disp: , Rfl:  .  docusate sodium (COLACE) 100 MG capsule, Take 100 mg by mouth daily. , Disp: , Rfl:  .  FLUoxetine (PROZAC) 20 MG capsule, Take 20 mg by mouth daily., Disp: , Rfl:  .  fluticasone (FLONASE) 50 MCG/ACT nasal spray, USE 2 SPRAYS IN EACH NOSTRIL EVERY DAY AS NEEDED FOR ALLERGIES, Disp: , Rfl:  .  gabapentin (NEURONTIN) 100 MG capsule, Take 100 mg by mouth 3 (three) times daily., Disp: , Rfl:  .  galantamine (RAZADYNE) 12 MG tablet, Take 12 mg by mouth 2 (two) times daily. , Disp: , Rfl:  .  hydrocortisone cream 1 %, Apply 1 application topically daily as needed for itching., Disp: , Rfl:  .  lactulose (CHRONULAC) 10 GM/15ML solution, Take 30 mLs (20 g total) by mouth daily as  needed for mild constipation., Disp: 120 mL, Rfl: 0 .  lisinopril (ZESTRIL) 2.5 MG tablet, Take 1 tablet (2.5 mg total) by mouth daily., Disp: 30 tablet, Rfl: 0 .  meclizine (ANTIVERT) 25 MG tablet, Take 25 mg by mouth 3 (three) times daily as needed for dizziness., Disp: , Rfl:  .  Melatonin 5 MG TABS, Take 1 tablet (5 mg total) by mouth at bedtime., Disp: 30 tablet, Rfl: 0 .  memantine (NAMENDA) 5 MG tablet, Take 5 mg by mouth 2 (two) times a day., Disp: , Rfl:  .  methocarbamol (ROBAXIN) 500 MG tablet, Take 500 mg by mouth 3 (three) times daily as needed for muscle spasms., Disp: , Rfl:  .  methylphenidate (RITALIN) 5 MG tablet, Take 1 tablet by mouth 2 (two) times daily., Disp: , Rfl:  .  metoprolol tartrate (LOPRESSOR) 25 MG tablet, Take by mouth., Disp: , Rfl:  .  Multiple Vitamin (MULTIVITAMIN WITH MINERALS) TABS tablet, Take 1 tablet by mouth daily., Disp: 30 tablet, Rfl: 0 .  neomycin-bacitracin-polymyxin (NEOSPORIN) ointment, Apply 1 application topically daily as needed for wound care. apply to eye, Disp: , Rfl:  .  nystatin cream (MYCOSTATIN), Apply 1 application topically 2 (two) times daily., Disp: 30 g, Rfl: 0 .  OVER THE COUNTER MEDICATION, Place 0.75 mLs under the  tongue at bedtime., Disp: , Rfl:  .  Polyethylene Glycol 3350 (PEG 3350) 17 GM/SCOOP POWD, Take 17 g by mouth daily., Disp: , Rfl:  .  rivastigmine (EXELON) 9.5 mg/24hr, Place 1 patch onto the skin daily., Disp: , Rfl:  .  sodium chloride (OCEAN) 0.65 % SOLN nasal spray, Place 2 sprays into both nostrils 2 (two) times daily as needed for congestion., Disp: , Rfl:  .  tamsulosin (FLOMAX) 0.4 MG CAPS capsule, Take 1 capsule (0.4 mg total) by mouth daily., Disp: 30 capsule, Rfl: 2 .  terazosin (HYTRIN) 5 MG capsule, Take 5 mg by mouth 2 (two) times daily. , Disp: , Rfl:  .  traMADol (ULTRAM) 50 MG tablet, Take 50 mg by mouth every 6 (six) hours as needed for moderate pain. , Disp: , Rfl:  .  vitamin E 1000 UNIT capsule, Take  1,000 Units by mouth daily. , Disp: , Rfl:   Past Medical History: Past Medical History:  Diagnosis Date  . Anxiety   . Chronic back pain   . DDD (degenerative disc disease), cervical   . DDD (degenerative disc disease), lumbar   . Dementia ([place])   . Depression   . Hypertension   . Meniere's disease   . Pneumonia 2015  . Umbilical hernia   . UTI (urinary tract infection)     Tobacco Use: Social History   Tobacco Use  Smoking Status Former Smoker  . Quit date: 08/02/1962  . Years since quitting: 57.0  Smokeless Tobacco Never Used    Labs: Recent Review [facility] Latest Ref Rng & Units 04/19/2019   Cholestrol 0 - 200 mg/dL 162   LDLCALC 0 - 99 mg/dL 104(H)   HDL >40 mg/dL 44   Trlycerides <150 mg/dL 70       Exercise Target Goals: Exercise Program Goal: Individual exercise prescription set using results from initial 6 min walk test and THRR while considering  patient's activity barriers and safety.   Exercise Prescription Goal: Initial exercise prescription builds to 30-45 minutes a day of aerobic activity, 2-3 days per week.  Home exercise guidelines will be given to patient during program as part of exercise prescription that the participant will acknowledge.  Activity Barriers & Risk Stratification: Activity Barriers & Cardiac Risk Stratification - 05/23/19 1119      Activity Barriers & Cardiac Risk Stratification   Activity Barriers  Balance Concerns;Back Problems;Other (comment)   DJD Neck and Back, "crippled when wakes up-sits with ice pack for 30 min and is better to move   Comments  Left foot goes to sleep when walks or stands for a long time.   Wll improve after more movement    Cardiac Risk Stratification  Moderate       6 Minute Walk: 6 Minute Walk    Row Name 05/28/19 1314 08/17/19 1108       6 Minute Walk   Phase  Initial  Discharge    Distance  1532 feet  1835 feet    Distance % Change  -  19.8  %    Distance Feet Change  -  303 ft    Walk Time  6 minutes  6 minutes    # of Rest Breaks  0  0    MPH  2.9  3.47    METS  2.7  3.75    RPE  7  15    Perceived Dyspnea  0  -    VO2 Peak  9.35  13.12    Symptoms  No  No    Resting HR  58 bpm  65 bpm    Resting BP  108/70  120/68    Resting Oxygen Saturation   98 %  -    Exercise Oxygen Saturation  during 6 min walk  94 %  -    Max Ex. HR  92 bpm  105 bpm    Max Ex. BP  132/66  178/86    2 Minute Post BP  114/70  -       Oxygen Initial Assessment:   Oxygen Re-Evaluation:   Oxygen Discharge (Final Oxygen Re-Evaluation):   Initial Exercise Prescription: Initial Exercise Prescription - 05/28/19 1300      Date of Initial Exercise RX and Referring Provider   Date  05/28/19    Referring Provider  Paraschos      Treadmill   MPH  2.5    Grade  0.5    Minutes  15    METs  3.09      Recumbant Bike   Level  3    RPM  60    Watts  20    Minutes  15    METs  2.7      NuStep   Level  4    SPM  80    Minutes  15    METs  2.7      Elliptical   Level  1    Speed  3    Minutes  15      Biostep-RELP   Level  4    SPM  50    Minutes  15    METs  3      Prescription Details   Frequency (times per week)  3    Duration  Progress to 30 minutes of continuous aerobic without signs/symptoms of physical distress      Intensity   THRR 40-80% of Max Heartrate  92-126    Ratings of Perceived Exertion  11-13    Perceived Dyspnea  0-4      Progression   Progression  Continue to progress workloads to maintain intensity without signs/symptoms of physical distress.      Resistance Training   Training Prescription  Yes    Weight  8 lb    Reps  10-15       Perform Capillary Blood Glucose checks as needed.  Exercise Prescription Changes: Exercise Prescription Changes    Row Name 06/08/19 1000 06/27/19 1300 07/10/19 1600 07/31/19 1000 08/09/19 1600     Response to Exercise   Blood Pressure (Admit)  132/78  128/70   134/76  116/68  120/64   Blood Pressure (Exercise)  142/74  152/76  128/70  134/74  152/78   Blood Pressure (Exit)  104/62  112/66  128/64  108/64  110/64   Heart Rate (Admit)  56 bpm  50 bpm  61 bpm  66 bpm  65 bpm   Heart Rate (Exercise)  90 bpm  99 bpm  102 bpm  103 bpm  101 bpm   Heart Rate (Exit)  66 bpm  60 bpm  73 bpm  74 bpm  64 bpm   Rating of Perceived Exertion (Exercise)  _0 Symptoms  none  none  none  none  none   Duration  Continue with 30 min  of aerobic exercise without signs/symptoms of physical distress.  Continue with 30 min of aerobic exercise without signs/symptoms of physical distress.  Continue with 30 min of aerobic exercise without signs/symptoms of physical distress.  Continue with 30 min of aerobic exercise without signs/symptoms of physical distress.  Continue with 30 min of aerobic exercise without signs/symptoms of physical distress.   Intensity  THRR unchanged  THRR unchanged  THRR unchanged  THRR unchanged  THRR unchanged     Progression   Progression  Continue to progress workloads to maintain intensity without signs/symptoms of physical distress.  Continue to progress workloads to maintain intensity without signs/symptoms of physical distress.  Continue to progress workloads to maintain intensity without signs/symptoms of physical distress.  Continue to progress workloads to maintain intensity without signs/symptoms of physical distress.  Continue to progress workloads to maintain intensity without signs/symptoms of physical distress.   Average METs  2.9  4  4.11  3.92  5.25     Resistance Training   Training Prescription  Yes  Yes  Yes  Yes  Yes   Weight  10 lb  10 lb  10 lbs  10 lbs  10 lb   Reps  10-15  10-15  10-15  10-15  10-15     Interval Training   Interval Training  No  -  No  No  -     Treadmill   MPH  2.5  2.5  2.8  3  -   Grade  0.5  0.5  1.5  2  -   Minutes  _0 -   METs  3.09  3.09  3.72  4.12  -     Recumbant  Bike   Level  _1 -   RPM  60  60  -  -  -   Watts  -  66  76  68  -   Minutes  _2 -   METs  2.7  4.3  4.62  4.45  -     NuStep   Level  -  -  -  5  -   Minutes  -  -  -  15  -   METs  -  -  -  4.1  -     Elliptical   Level  _3 -   Speed  -  3  3  3.9  -   Minutes  _4 -     Biostep-RELP   Level  _5 -   SPM  50  50  -  -  -   Minutes  _6 -   METs  _7 -     Home Exercise Plan   Plans to continue exercise at  -  -  Home (comment) walking  Home (comment) walking  -   Frequency  -  -  Add 2 additional days to program exercise sessions.  Add 2 additional days to program exercise sessions.  -   Initial Home Exercises Provided  -  -  06/14/19  06/14/19  -      Exercise Comments: Exercise Comments    Row Name 05/29/19 1054  Exercise Comments  First full day of exercise!  Patient was oriented to gym and equipment including functions, settings, policies, and procedures.  Patient's individual exercise prescription and treatment plan were reviewed.  All starting workloads were established based on the results of the 6 minute walk test done at initial orientation visit.  The plan for exercise progression was also introduced and progression will be customized based on patient's performance and goals.          Exercise Goals and Review: Exercise Goals    Row Name 05/28/19 1323             Exercise Goals   Increase Physical Activity  Yes       Expected Outcomes  Short Term: Attend rehab on a regular basis to increase amount of physical activity.;Long Term: Add in home exercise to make exercise part of routine and to increase amount of physical activity.;Long Term: Exercising regularly at least 3-5 days a week.       Increase Strength and Stamina  Yes       Intervention  Provide advice, education, support and counseling about physical activity/exercise needs.;Develop an individualized exercise  prescription for aerobic and resistive training based on initial evaluation findings, risk stratification, comorbidities and participant's personal goals.       Expected Outcomes  Short Term: Increase workloads from initial exercise prescription for resistance, speed, and METs.;Short Term: Perform resistance training exercises routinely during rehab and add in resistance training at home;Long Term: Improve cardiorespiratory fitness, muscular endurance and strength as measured by increased METs and functional capacity (6MWT)       Able to understand and use rate of perceived exertion (RPE) scale  Yes       Intervention  Provide education and explanation on how to use RPE scale       Expected Outcomes  Short Term: Able to use RPE daily in rehab to express subjective intensity level;Long Term:  Able to use RPE to guide intensity level when exercising independently       Knowledge and understanding of Target Heart Rate Range (THRR)  Yes       Intervention  Provide education and explanation of THRR including how the numbers were predicted and where they are located for reference       Expected Outcomes  Short Term: Able to state/look up THRR;Short Term: Able to use daily as guideline for intensity in rehab;Long Term: Able to use THRR to govern intensity when exercising independently       Able to check pulse independently  Yes       Intervention  Provide education and demonstration on how to check pulse in carotid and radial arteries.;Review the importance of being able to check your own pulse for safety during independent exercise       Expected Outcomes  Short Term: Able to explain why pulse checking is important during independent exercise;Long Term: Able to check pulse independently and accurately       Understanding of Exercise Prescription  Yes       Intervention  Provide education, explanation, and written materials on patient's individual exercise prescription       Expected Outcomes  Short Term:  Able to explain program exercise prescription;Long Term: Able to explain home exercise prescription to exercise independently          Exercise Goals Re-Evaluation : Exercise Goals Re-Evaluation    Row Name 05/29/19 1054 06/08/19 1006 06/14/19 1118 06/27/19 1322 07/10/19 1603  Exercise Goal Re-Evaluation   Exercise Goals Review  Increase Physical Activity;Increase Strength and Stamina;Able to understand and use rate of perceived exertion (RPE) scale;Able to understand and use Dyspnea scale;Knowledge and understanding of Target Heart Rate Range (THRR);Understanding of Exercise Prescription  Increase Physical Activity;Increase Strength and Stamina;Able to understand and use rate of perceived exertion (RPE) scale;Knowledge and understanding of Target Heart Rate Range (THRR);Able to check pulse independently;Understanding of Exercise Prescription  Increase Physical Activity;Increase Strength and Stamina;Understanding of Exercise Prescription  Increase Physical Activity;Increase Strength and Stamina;Able to understand and use rate of perceived exertion (RPE) scale;Knowledge and understanding of Target Heart Rate Range (THRR);Able to check pulse independently;Understanding of Exercise Prescription  Increase Physical Activity;Increase Strength and Stamina;Understanding of Exercise Prescription   Comments  Reviewed RPE scale, THR and program prescription with pt today.  Pt voiced understanding and was given a copy of goals to take home.  Gershon Mussel has done very well so far.  The elliptical is most challengin for him - her reproted being very tired after the first session he did elliptical.  He was taken off of one of his BP meds and felt much more energy since then.  He is cleared by his Dr to use 10 lb weights.  Gershon Mussel is doing well in rehab.  He is enjoying come to class and getting back into exercise again.  He used to run and bike and now walks.  He used to be up to a 1-2 miles a day.Reviewed home exercise with pt  today.  Pt plans to walking and using weights at home for exercise.  He also belongs  [facility] [employer] once it reopens. Reviewed THR, pulse, RPE, sign and symptoms, NTG use, and when to call 911 or MD.  Also discussed weather considerations and indoor options.  Pt voiced understanding.  Gershon Mussel is progressing well and has increased MET level.  he uses 10 lb weights for strength work.Gershon Mussel is doing well in rehab.  He is already up to 76 watts on the recumbent bike and level 9 on the BioStep.  We will continue to monitor his progress.   Expected Outcomes  Short: Use RPE daily to regulate intensity. Long: Follow program prescription in THR.  Short - continue to attend consistently Long - increase overall MET level  Short: Start to add home exercise back in.  Long: Continue to increase stamina.  Short - continue to progress workloads Long - increase overall MET level  Short: Continue to push on treadmill.  Long: Continue to exericse on off days.   [place] Name 07/31/19 1028 08/09/19 1610 08/10/19 1045         Exercise Goal Re-Evaluation   Exercise Goals Review  Increase Physical Activity;Increase Strength and Stamina;Understanding of Exercise Prescription  Increase Physical Activity;Increase Strength and Stamina;Able to understand and use rate of perceived exertion (RPE) scale;Knowledge and understanding of Target Heart Rate Range (THRR);Able to check pulse independently;Understanding of Exercise Prescription  Increase Physical Activity;Increase Strength and Stamina     Comments  Gershon Mussel continues to do well in rehab.  He was doing 3.9 mph on the ellipitcal!!  He is also up to 68 watts on the recumbent bike.  We will continue to monitor his progress.  Gershon Mussel has improved overall MET level.  He is on level 11 on the elliptical and does up to 10% grade on TM.  Tom and his wife walk around his block for exercise. He does push  ups and tries to stay  active when he is not in rehab. He [facility] back [facility] [employer] or [facility]  [facility] when he is done with the program.     Expected Outcomes  Short: Continue to increase workloads.  Long: Continue to improve stamina.  Short - continue to attend consistently Long : maintain fitness gains  Short: continue to exercise regularly. Long: joing [employer] or the [employer].        Discharge Exercise Prescription (Final Exercise Prescription Changes): Exercise Prescription Changes - 08/09/19 1600      Response to Exercise   Blood Pressure (Admit)  120/64    Blood Pressure (Exercise)  152/78    Blood Pressure (Exit)  110/64    Heart Rate (Admit)  65 bpm    Heart Rate (Exercise)  101 bpm    Heart Rate (Exit)  64 bpm    Rating of Perceived Exertion (Exercise)  14    Symptoms  none    Duration  Continue with 30 min of aerobic exercise without signs/symptoms of physical distress.    Intensity  THRR unchanged      Progression   Progression  Continue to progress workloads to maintain intensity without signs/symptoms of physical distress.    Average METs  5.25      Resistance Training   Training Prescription  Yes    Weight  10 lb    Reps  10-15       Nutrition:  Target Goals: Understanding of nutrition guidelines, daily intake of sodium <1599m, cholesterol <208m calories 30% from fat and 7% or less from saturated fats, daily to have 5 or more servings of fruits and vegetables.  Biometrics: Pre Biometrics - 05/28/19 1323      Pre Biometrics   Height  5' 8.25" (1.734 m)    Weight  204 lb (92.5 kg)    BMI (Calculated)  30.78    Single Leg Stand  2.4 seconds        Nutrition Therapy Plan and Nutrition Goals: Nutrition Therapy & Goals - 05/28/19 1200      Nutrition Therapy   Diet  low Na, HH diet    Protein (specify units)  75g    Fiber  30 grams    Whole Grain Foods  3 servings    Saturated Fats  12 max. grams    Fruits and Vegetables  5 servings/day    Sodium  1.5 grams      Personal Nutrition Goals   Nutrition Goal  ST: have at least 2 servings  of Pro per day and sneak in some more (like soymilk) LT: walk better and get stronger.    Comments  Spoke with pt and wife. B pt has cereal with almond milk and coffee (creamer/splenda). L salad with veggies olive oil and vinegar and some chickpeas (a couple of tbsp) or some peanut butter and fruit or tomato sandwich on homemade bread and dukes mayo or fruit and cottage cheese. D: chicken sald or tuna sald or buger or rice and beans; usually just steamed vegetables and mashed potatoes (smart balance with olive oil). Pt reports using canola oil in baking. Pt reports not liking meat too much, discussed other ways to eat consistent protein like switching to soymilk or adding more chickpeas to salad, or when having yogurt and fruit to have greek yogurt which is higher in protein. Pt has been watching Na, chosing low salt options. Discussed HH eating.      Intervention Plan   Intervention  Prescribe, educate and counsel regarding individualized specific dietary modifications aiming towards targeted core components such as weight, hypertension, lipid management, diabetes, heart failure and other comorbidities.;Nutrition handout(s) given to patient.    Expected Outcomes  Short Term Goal: Understand basic principles of dietary content, such as calories, fat, sodium, cholesterol and nutrients.;Short Term Goal: A plan has been developed with personal nutrition goals set during dietitian appointment.;Long Term Goal: Adherence to prescribed nutrition plan.       Nutrition Assessments: Nutrition Assessments - 05/24/19 1053      MEDFICTS Scores   Pre Score  20       Nutrition Goals Re-Evaluation: Nutrition Goals Re-Evaluation    Row Name 07/10/19 1111 08/21/19 1140           Goals   Nutrition Goal  ST: have at least 2 servings of Pro per day and sneak in some more (like soymilk) LT: walk better and get stronger.  ST: have at least 2 servings of Pro per day and sneak in some more (like soymilk) LT: walk  better and get stronger.      Comment  Pt reports that his energy has been abou tthe same and that his diet is still going well, added some more plant based protein like we discussed such as chickpeas. Pt reports his denentia is making him feel less confident, but he is still able to do everything he wants to do.  Continue with current changes      Expected Outcome  ST: have at least 2 servings of Pro per day and sneak in some more (like soymilk) LT: walk better and get stronger.  ST: have at least 2 servings of Pro per day and sneak in some more (like soymilk) LT: walk better and get stronger.         Nutrition Goals Discharge (Final Nutrition Goals Re-Evaluation): Nutrition Goals Re-Evaluation - 08/21/19 1140      Goals   Nutrition Goal  ST: have at least 2 servings of Pro per day and sneak in some more (like soymilk) LT: walk better and get stronger.    Comment  Continue with current changes    Expected Outcome  ST: have at least 2 servings of Pro per day and sneak in some more (like soymilk) LT: walk better and get stronger.       Psychosocial: Target Goals: Acknowledge presence or absence of significant depression and/or stress, maximize coping skills, provide positive support system. Participant is able to verbalize types and ability to use techniques and skills needed for reducing stress and depression.   Initial Review & Psychosocial Screening: Initial Psych Review & Screening - 05/23/19 1122      Initial Review   Current issues with  History of Depression;Current Psychotropic Meds    Source of Stress Concerns  Chronic Illness    Comments  Alzihemers  slow progression- Short to term memory is starting to degrade.  ABle to follow directions, may not remeber he was in class after he goes home      [place]?  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      Screening Interventions   Interventions  Encouraged to exercise    Expected Outcomes  Short Term goal: Utilizing psychosocial  counselor, staff and physician to assist with identification of specific Stressors or current issues interfering with healing process. Setting desired goal for each stressor or current issue identified.;Long Term Goal: Stressors or current issues are controlled or eliminated.;Short Term goal: Identification and review with participant of any Quality of Life or Depression concerns found by scoring the questionnaire.;Long Term goal: The participant improves quality of Life and PHQ9 Scores as seen by post scores and/or verbalization of changes       Quality of Life Scores:  Quality of Life - 05/24/19 1052      Quality of Life   Select  Quality of Life      Quality of Life Scores   Health/Function Pre  20 %    Socioeconomic Pre  24.31 %    Psych/Spiritual Pre  23.14 %    Family Pre  23.4 %    GLOBAL Pre  22.1 %      Scores of 19 and below usually indicate a poorer quality of life in these areas.  A difference of  2-3 points is a clinically meaningful difference.  A difference of 2-3 points in the total score of the Quality of Life Index has been associated with significant improvement in overall quality of life, self-image, physical symptoms, and general health in studies assessing change in quality of life.  PHQ-9: Recent Review Flowsheet Data    Depression screen [facility] 2/9 06/28/2019 05/28/2019   Decreased Interest 0 2   Down, Depressed, Hopeless 0 1   PHQ - 2 Score 0 3   Altered sleeping 0 1   Tired, decreased energy 1 3   Change in appetite 0 0   Feeling bad or failure about yourself  0 0   Trouble concentrating 0 1   Moving slowly or fidgety/restless 0 0   Suicidal thoughts 0 0   PHQ-9 Score 1 8   Difficult doing work/chores Not difficult at all Somewhat  difficult     Interpretation of Total Score  Total Score Depression Severity:  1-4 = Minimal depression, 5-9 = Mild depression, 10-14 = Moderate depression, 15-19 = Moderately severe depression, 20-27 = Severe depression   Psychosocial Evaluation and Intervention:   Psychosocial Re-Evaluation: Psychosocial Re-Evaluation    [place] Name 06/14/19 1119 07/06/19 1054 08/10/19 1049         Psychosocial Re-Evaluation   Current issues with  Current Stress Concerns;Current Depression;Current Psychotropic Meds  Current Depression;Current Sleep Concerns;History of Depression;Current Psychotropic Meds;Current Stress Concerns  Current Depression;Current Sleep Concerns;History of Depression;Current Anxiety/Panic     Comments  Gershon Mussel is doing well in rehab. He is on prozac and doing well.  He has good day and bad days. He views each day as a gift.  He is hoping to make it to 48 as his dad died at 21.  He sleeps pretty well and uses xanax to help.  He was using melatonin but Dr. Sabra Heck took him off.  If he doesn't take his Xanax he wakes up at 3am.  He is still waking up at night sometimes around 3am. He states he is feeling good and wants to make it to his 78th birthday. 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  Sometimes he gets a little depressed about getting older and forgets things at times. He takes Prozac that helps with his depressions. He has past his dads age and feels more liberated because of it. The pandemic isolations has not affected his mood  since his wife and he share common interest.     Expected Outcomes  Short: Continue to work through each day Long: Continue to stay positive.  Short: continue to exersice for his mental health and make it to his 78th birthday. Long:maintain an exercise routine post [facility] to maintain mental health and keep stress at a minumum.  Short: Continue to attend [facility] regularly for regular exercise and social engagement. Long: Continue to  improve symptoms and manage a positive mental state.     Interventions  Encouraged to attend Cardiac Rehabilitation for the exercise  Encouraged to attend Cardiac Rehabilitation for the exercise  Encouraged to attend Cardiac Rehabilitation for the exercise     Continue Psychosocial Services   Follow up required by staff  Follow up required by staff  Follow up required by staff        Psychosocial Discharge (Final Psychosocial Re-Evaluation): Psychosocial Re-Evaluation - 08/10/19 1049      Psychosocial Re-Evaluation   Current issues with  Current Depression;Current Sleep Concerns;History of Depression;Current Anxiety/Panic    Comments  Sometimes he gets a little depressed about getting older and forgets things at times. He takes Prozac that helps with his depressions. He has past his dads age and feels more liberated because of it. The pandemic isolations has not affected his mood since his wife and he share common interest.    Expected Outcomes  Short: Continue to attend [facility] regularly for regular exercise and social engagement. Long: Continue to improve symptoms and manage a positive mental state.    Interventions  Encouraged to attend Cardiac Rehabilitation for the exercise    Continue Psychosocial Services   Follow up required by staff       Vocational Rehabilitation: Provide vocational rehab assistance to qualifying candidates.   Vocational Rehab Evaluation & Intervention: Vocational Rehab - 05/23/19 1129      Initial Vocational Rehab Evaluation & Intervention   Assessment shows need for Vocational Rehabilitation  No       Education: Education Goals: Education classes will be provided on a variety of topics geared toward better understanding of heart health and risk factor modification. Participant will state understanding/return demonstration of topics presented as noted by education test scores.  Learning Barriers/Preferences: Learning Barriers/Preferences - 05/23/19 1128       Learning Barriers/Preferences   Learning Barriers  --   Memory   Learning Preferences  Individual Instruction;Written Material;Video       Education Topics:  AED/CPR: - Group verbal and written instruction with the use of models to demonstrate the basic use of the AED with the basic ABC's of resuscitation.   General Nutrition Guidelines/Fats and Fiber: -Group instruction provided by verbal, written material, models and posters to present the general guidelines for heart healthy nutrition. Gives an explanation and review of dietary fats and fiber.   Controlling Sodium/Reading Food Labels: -Group verbal and written material supporting the discussion of sodium use in heart healthy nutrition. Review and explanation with models, verbal and written materials for utilization of the food label.   Exercise Physiology & General Exercise Guidelines: - Group verbal and written instruction with models to review the exercise physiology of the cardiovascular system and associated critical values. Provides general exercise guidelines with specific guidelines to those with heart or lung disease.    Aerobic Exercise & Resistance Training: - Gives group verbal and written instruction on the various components of exercise. Focuses on aerobic and resistive training programs and the benefits of this training and  how to safely progress through these programs..   Cardiac Rehab from 08/09/2019 in [facility] Cardiac and Pulmonary Rehab  Date  08/09/19  Educator  [facility]  Instruction Review Code  1- Verbalizes Understanding      Flexibility, Balance, Mind/Body Relaxation: Provides group verbal/written instruction on the benefits of flexibility and balance training, including mind/body exercise modes such as yoga, pilates and tai chi.  Demonstration and skill practice provided.   Stress and Anxiety: - Provides group verbal and written instruction about the health risks of elevated stress and causes of high  stress.  Discuss the correlation between heart/lung disease and anxiety and treatment options. Review healthy ways to manage with stress and anxiety.   Depression: - Provides group verbal and written instruction on the correlation between heart/lung disease and depressed mood, treatment options, and the stigmas associated with seeking treatment.   Anatomy & Physiology of the Heart: - Group verbal and written instruction and models provide basic cardiac anatomy and physiology, with the coronary electrical and arterial systems. Review of Valvular disease and Heart Failure   Cardiac Procedures: - Group verbal and written instruction to review commonly prescribed medications for heart disease. Reviews the medication, class of the drug, and side effects. Includes the steps to properly store meds and maintain the prescription regimen. (beta blockers and nitrates)   Cardiac Medications I: - Group verbal and written instruction to review commonly prescribed medications for heart disease. Reviews the medication, class of the drug, and side effects. Includes the steps to properly store meds and maintain the prescription regimen.   Cardiac Medications II: -Group verbal and written instruction to review commonly prescribed medications for heart disease. Reviews the medication, class of the drug, and side effects. (all other drug classes)   Cardiac Rehab from 08/09/2019 in [facility] Cardiac and Pulmonary Rehab  Date  08/09/19  Educator  [facility]  Instruction Review Code  1- Verbalizes Understanding       Go Sex-Intimacy & Heart Disease, Get SMART - Goal Setting: - Group verbal and written instruction through game format to discuss heart disease and the return to sexual intimacy. Provides group verbal and written material to discuss and apply goal setting through the application of the S.M.A.R.T. Method.   Other Matters of the Heart: - Provides group verbal, written materials and models to describe Stable  Angina and Peripheral Artery. Includes description of the disease process and treatment options available to the cardiac patient.   Exercise & Equipment Safety: - Individual verbal instruction and demonstration of equipment use and safety with use of the equipment.   Cardiac Rehab from 05/28/2019 in [facility] Cardiac and Pulmonary Rehab  Date  05/28/19  Educator  AS  Instruction Review Code  1- Verbalizes Understanding      Infection Prevention: - Provides verbal and written material to individual with discussion of infection control including proper hand washing and proper equipment cleaning during exercise session.   Cardiac Rehab from 05/28/2019 in [facility] Cardiac and Pulmonary Rehab  Date  05/28/19  Educator  AS  Instruction Review Code  1- Verbalizes Understanding      Falls Prevention: - Provides verbal and written material to individual with discussion of falls prevention and safety.   Cardiac Rehab from 05/28/2019 in [facility] Cardiac and Pulmonary Rehab  Date  05/28/19  Educator  AS  Instruction Review Code  1- Verbalizes Understanding      Diabetes: - Individual verbal and written instruction to review signs/symptoms of diabetes, desired ranges of glucose level  fasting, after meals and with exercise. Acknowledge that pre and post exercise glucose checks will be done for 3 sessions at entry of program.   Know Your Numbers and Risk Factors: -Group verbal and written instruction about important numbers in your health.  Discussion of what are risk factors and how they play a role in the disease process.  Review of Cholesterol, Blood Pressure, Diabetes, and BMI and the role they play in your overall health.   Cardiac Rehab from 08/09/2019 in [facility] Cardiac and Pulmonary Rehab  Date  08/09/19  Educator  [facility]  Instruction Review Code  1- Verbalizes Understanding      Sleep Hygiene: -Provides group verbal and written instruction about how sleep can affect your health.  Define sleep hygiene,  discuss sleep cycles and impact of sleep habits. Review good sleep hygiene tips.    Other: -Provides group and verbal instruction on various topics (see comments)   Knowledge Questionnaire Score: Knowledge Questionnaire Score - 05/24/19 1052      Knowledge Questionnaire Score   Pre Score  26/26       Core Components/Risk Factors/Patient Goals at Admission: Personal Goals and Risk Factors at Admission - 05/28/19 1324      Core Components/Risk Factors/Patient Goals on Admission    Weight Management  Yes    Admit Weight  204 lb (92.5 kg)    Hypertension  Yes    Intervention  Provide education on lifestyle modifcations including regular physical activity/exercise, weight management, moderate sodium restriction and increased consumption of fresh fruit, vegetables, and low fat dairy, alcohol moderation, and smoking cessation.;Monitor prescription use compliance.    Expected Outcomes  Short Term: Continued assessment and intervention until BP is < 140/11m HG in hypertensive participants. < 130/875mHG in hypertensive participants with diabetes, heart failure or chronic kidney disease.;Long Term: Maintenance of blood pressure at goal levels.    Lipids  Yes    Intervention  Provide education and support for participant on nutrition & aerobic/resistive exercise along with prescribed medications to achieve LDL <7052mHDL >76m72m  Expected Outcomes  Short Term: Participant states understanding of desired cholesterol values and is compliant with medications prescribed. Participant is following exercise prescription and nutrition guidelines.;Long Term: Cholesterol controlled with medications as prescribed, with individualized exercise RX and with personalized nutrition plan. Value goals: LDL < 70mg35mL > 40 mg.       Core Components/Risk Factors/Patient Goals Review:  Goals and Risk Factor Review    Row Name 06/14/19 1121 07/06/19 1058 08/10/19 1054         Core Components/Risk  Factors/Patient Goals Review   Personal Goals Review  Weight Management/Obesity;Hypertension;Lipids  Weight Management/Obesity;Hypertension;Lipids  Weight Management/Obesity;Improve shortness of breath with ADL's;Lipids     Review  Tom's weight has been steady. He is starting to lose now thanks to some diet tweaks.  His blood pressures have improved and he is feeling better overall.  He checks it every morning at home.  He has not noted any problems with his medications.  Tom wants to lose some weight and be as healthy as he can be. His blood pressure has been up and down lately. His blood pressure has been up and down since some of his medications changes. He is checking his blood pressure at home. He is taking a statin to help with his lipds. He will be on Lipitor for a year.  Toms weight is 197 pounds. Her is watching his eating and wants to reduce his weight  to 190 pounds. His lipids were 160 last time he had them checked. He is able to do more at home now and does not get as tired when doing ADL. 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 Gershon Mussel states that his overall health feels better and that the program has helped him.     Expected Outcomes  Short: Continue to work on weight. Long: Continue to monitor risk factors.  Short: lose 5 pounds in the next two weeks. Long: Maintain weight loss post [facility] independently.  Short: Exercise and take medications as prescribed to help with overall health. Long: maintain exercise and medications independently to improve upon overall health.        Core Components/Risk Factors/Patient Goals at Discharge (Final Review):  Goals and Risk Factor Review - 08/10/19 1054      Core Components/Risk Factors/Patient Goals Review   Personal Goals Review  Weight Management/Obesity;Improve shortness of breath with ADL's;Lipids    Review  Toms weight is 197 pounds. Her is watching his eating and wants to reduce his weight to 190 pounds. His lipids  were 160 last time he had them checked. He is able to do more at home now and does not get as tired when doing ADL. 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 Gershon Mussel states that his overall health feels better and that the program has helped him.    Expected Outcomes  Short: Exercise and take medications as prescribed to help with overall health. Long: maintain exercise and medications independently to improve upon overall health.       ITP Comments: ITP Comments    Row Name 05/23/19 1137 05/28/19 1311 05/30/19 0554 06/27/19 0556 07/25/19 1336   ITP Comments  Virtual CAll for Cardiac Rehab Orientation completed. Has appt on 8/10 for EP/RD Evals and gym orientaiton.  Documentation of diagnosis can be found in [facility] 04/19/19  Completed 6MWT and initial nutrition evaluation  30 Day Review Completed today. Continue with ITP unless changed by Medical Director review.   New to program  30 Day review. Continue with ITP unless directed changes per Medical Director review.  30 day review completed. ITP sent to Dr. Emily Filbert, Medical Director of Cardiac and Pulmonary Rehab. Continue with ITP unless changes are made by physician.  Department closed starting 10/2 until further notice by infection prevention and Health at Work teams for [place].   [place] Name 08/22/19 0620           ITP Comments  30 day review completed. Continue with ITP sent to Dr. Emily Filbert, Medical Director of Cardiac and Pulmonary Rehab for review , changes as needed and signature.          Comments:

## 2019-08-23 ENCOUNTER — Encounter: Payer: Medicare Other | Admitting: *Deleted

## 2019-08-23 ENCOUNTER — Other Ambulatory Visit: Payer: Self-pay

## 2019-08-23 DIAGNOSIS — I213 ST elevation (STEMI) myocardial infarction of unspecified site: Secondary | ICD-10-CM | POA: Diagnosis not present

## 2019-08-23 NOTE — Progress Notes (Signed)
Daily Session Note  Patient Details  Name: Marvin Jefferson MRN: [id number] Date of Birth: 1941/02/15 Referring Provider:     Cardiac Rehab from 05/28/2019 in [facility] Cardiac and Pulmonary Rehab  Referring Provider  [place]      Encounter Date: 08/23/2019  Check In: Session Check In - 08/23/19 1058      Check-In   Supervising physician immediately available to respond to emergencies  See telemetry face sheet for immediately available ER MD    Location  [facility]-Cardiac & Pulmonary Rehab    Staff Present  Heath Lark, RN, BSN, CCRP;Amanda Sommer, BA, ACSM CEP, Exercise Physiologist;Jeanna Durrell BS, Exercise Physiologist    Virtual Visit  No    Medication changes reported      No    Fall or balance concerns reported     No    Warm-up and Cool-down  Performed on first and last piece of equipment    Resistance Training Performed  Yes    VAD Patient?  No    PAD/SET Patient?  No      Pain Assessment   Currently in Pain?  No/denies          Social History   Tobacco Use  Smoking Status Former Smoker  . Quit date: 08/02/1962  . Years since quitting: 57.0  Smokeless Tobacco Never Used    Goals Met:  Independence with exercise equipment Exercise tolerated well No report of cardiac concerns or symptoms  Goals Unmet:  Not Applicable  Comments: Pt able to follow exercise prescription today without complaint.  Will continue to monitor for progression.    Dr. Emily Filbert is Medical Director for [place] and [facility].

## 2019-08-24 ENCOUNTER — Encounter: Payer: Medicare Other | Admitting: *Deleted

## 2019-08-24 DIAGNOSIS — I213 ST elevation (STEMI) myocardial infarction of unspecified site: Secondary | ICD-10-CM | POA: Diagnosis not present

## 2019-08-24 NOTE — Patient Instructions (Signed)
Discharge Patient Instructions  Patient Details  Name: Marvin Jefferson MRN: [id number] Date of Birth: 09-27-1941 Referring Provider:  Isaias Cowman, MD   Number of Visits: 58  Reason for Discharge:  Patient reached a stable level of exercise. Patient independent in their exercise. Patient has met program and personal goals.  Smoking History:  Social History   Tobacco Use  Smoking Status Former Smoker  . Quit date: 08/02/1962  . Years since quitting: 57.0  Smokeless Tobacco Never Used    Diagnosis:  ST elevation myocardial infarction (STEMI), unspecified artery (HCC)  Initial Exercise Prescription: Initial Exercise Prescription - 05/28/19 1300      Date of Initial Exercise RX and Referring Provider   Date  05/28/19    Referring Provider  Marvin Jefferson      Treadmill   MPH  2.5    Grade  0.5    Minutes  15    METs  3.09      Recumbant Bike   Level  3    RPM  60    Watts  20    Minutes  15    METs  2.7      NuStep   Level  4    SPM  80    Minutes  15    METs  2.7      Elliptical   Level  1    Speed  3    Minutes  15      Biostep-RELP   Level  4    SPM  50    Minutes  15    METs  3      Prescription Details   Frequency (times per week)  3    Duration  Progress to 30 minutes of continuous aerobic without signs/symptoms of physical distress      Intensity   THRR 40-80% of Max Heartrate  92-126    Ratings of Perceived Exertion  11-13    Perceived Dyspnea  0-4      Progression   Progression  Continue to progress workloads to maintain intensity without signs/symptoms of physical distress.      Resistance Training   Training Prescription  Yes    Weight  8 lb    Reps  10-15       Discharge Exercise Prescription (Final Exercise Prescription Changes): Exercise Prescription Changes - 08/22/19 1300      Response to Exercise   Blood Pressure (Admit)  120/68    Blood Pressure (Exercise)  178/86    Blood Pressure (Exit)  120/62    Heart  Rate (Admit)  66 bpm    Heart Rate (Exercise)  105 bpm    Heart Rate (Exit)  67 bpm    Rating of Perceived Exertion (Exercise)  14    Symptoms  none    Duration  Continue with 30 min of aerobic exercise without signs/symptoms of physical distress.    Intensity  THRR unchanged      Progression   Progression  Continue to progress workloads to maintain intensity without signs/symptoms of physical distress.    Average METs  6.69      Resistance Training   Training Prescription  Yes    Weight  10 lbs    Reps  10-15      Interval Training   Interval Training  No      Treadmill   MPH  3    Grade  10    Minutes  15  METs  7.4      Recumbant Bike   Level  11    Watts  74    Minutes  15    METs  4.48      NuStep   Level  4    Minutes  15    METs  4.2      Elliptical   Level  1    Speed  3.9    Minutes  15      Biostep-RELP   Level  11    Minutes  15    METs  4      Home Exercise Plan   Plans to continue exercise at  Home (comment)   walking   Frequency  Add 2 additional days to program exercise sessions.    Initial Home Exercises Provided  06/14/19       Functional Capacity: 6 Minute Walk    Row Name 05/28/19 1314 08/17/19 1108       6 Minute Walk   Phase  Initial  Discharge    Distance  1532 feet  1835 feet    Distance % Change  -  19.8 %    Distance Feet Change  -  303 ft    Walk Time  6 minutes  6 minutes    # of Rest Breaks  0  0    MPH  2.9  3.47    METS  2.7  3.75    RPE  7  15    Perceived Dyspnea   0  -    VO2 Peak  9.35  13.12    Symptoms  No  No    Resting HR  58 bpm  65 bpm    Resting BP  108/70  120/68    Resting Oxygen Saturation   98 %  -    Exercise Oxygen Saturation  during 6 min walk  94 %  -    Max Ex. HR  92 bpm  105 bpm    Max Ex. BP  132/66  178/86    2 Minute Post BP  114/70  -       Quality of Life: Quality of Life - 05/24/19 1052      Quality of Life   Select  Quality of Life      Quality of Life Scores    Health/Function Pre  20 %    Socioeconomic Pre  24.31 %    Psych/Spiritual Pre  23.14 %    Family Pre  23.4 %    GLOBAL Pre  22.1 %       Personal Goals: Goals established at orientation with interventions provided to work toward goal. Personal Goals and Risk Factors at Admission - 05/28/19 1324      Core Components/Risk Factors/Patient Goals on Admission    Weight Management  Yes    Admit Weight  204 lb (92.5 kg)    Hypertension  Yes    Intervention  Provide education on lifestyle modifcations including regular physical activity/exercise, weight management, moderate sodium restriction and increased consumption of fresh fruit, vegetables, and low fat dairy, alcohol moderation, and smoking cessation.;Monitor prescription use compliance.    Expected Outcomes  Short Term: Continued assessment and intervention until BP is < 140/73m HG in hypertensive participants. < 130/84mHG in hypertensive participants with diabetes, heart failure or chronic kidney disease.;Long Term: Maintenance of blood pressure at goal levels.    Lipids  Yes    Intervention  Provide education and support for participant on nutrition & aerobic/resistive exercise along with prescribed medications to achieve LDL <37m, HDL >467m    Expected Outcomes  Short Term: Participant states understanding of desired cholesterol values and is compliant with medications prescribed. Participant is following exercise prescription and nutrition guidelines.;Long Term: Cholesterol controlled with medications as prescribed, with individualized exercise RX and with personalized nutrition plan. Value goals: LDL < 7055mHDL > 40 mg.        Personal Goals Discharge: Goals and Risk Factor Review - 08/10/19 1054      Core Components/Risk Factors/Patient Goals Review   Personal Goals Review  Weight Management/Obesity;Improve shortness of breath with ADL's;Lipids    Review  Marvin Jefferson weight is 197 pounds. Her is watching his eating and wants to  reduce his weight to 190 pounds. His lipids were 160 last time he had them checked. He is able to do more at home now and does not get as tired when doing ADL. 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 Marvin Jefferson that his overall health feels better and that the program has helped him.    Expected Outcomes  Short: Exercise and take medications as prescribed to help with overall health. Long: maintain exercise and medications independently to improve upon overall health.       Exercise Goals and Review: Exercise Goals    Row Name 05/28/19 1323             Exercise Goals   Increase Physical Activity  Yes       Expected Outcomes  Short Term: Attend rehab on a regular basis to increase amount of physical activity.;Long Term: Add in home exercise to make exercise part of routine and to increase amount of physical activity.;Long Term: Exercising regularly at least 3-5 days a week.       Increase Strength and Stamina  Yes       Intervention  Provide advice, education, support and counseling about physical activity/exercise needs.;Develop an individualized exercise prescription for aerobic and resistive training based on initial evaluation findings, risk stratification, comorbidities and participant's personal goals.       Expected Outcomes  Short Term: Increase workloads from initial exercise prescription for resistance, speed, and METs.;Short Term: Perform resistance training exercises routinely during rehab and add in resistance training at home;Long Term: Improve cardiorespiratory fitness, muscular endurance and strength as measured by increased METs and functional capacity (6MWT)       Able to understand and use rate of perceived exertion (RPE) scale  Yes       Intervention  Provide education and explanation on how to use RPE scale       Expected Outcomes  Short Term: Able to use RPE daily in rehab to express subjective intensity level;Long Term:  Able to use RPE to guide  intensity level when exercising independently       Knowledge and understanding of Target Heart Rate Range (THRR)  Yes       Intervention  Provide education and explanation of THRR including how the numbers were predicted and where they are located for reference       Expected Outcomes  Short Term: Able to state/look up THRR;Short Term: Able to use daily as guideline for intensity in rehab;Long Term: Able to use THRR to govern intensity when exercising independently       Able to check pulse independently  Yes       Intervention  Provide education and  demonstration on how to check pulse in carotid and radial arteries.;Review the importance of being able to check your own pulse for safety during independent exercise       Expected Outcomes  Short Term: Able to explain why pulse checking is important during independent exercise;Long Term: Able to check pulse independently and accurately       Understanding of Exercise Prescription  Yes       Intervention  Provide education, explanation, and written materials on patient's individual exercise prescription       Expected Outcomes  Short Term: Able to explain program exercise prescription;Long Term: Able to explain home exercise prescription to exercise independently          Exercise Goals Re-Evaluation: Exercise Goals Re-Evaluation    Row Name 05/29/19 1054 06/08/19 1006 06/14/19 1118 06/27/19 1322 07/10/19 1603     Exercise Goal Re-Evaluation   Exercise Goals Review  Increase Physical Activity;Increase Strength and Stamina;Able to understand and use rate of perceived exertion (RPE) scale;Able to understand and use Dyspnea scale;Knowledge and understanding of Target Heart Rate Range (THRR);Understanding of Exercise Prescription  Increase Physical Activity;Increase Strength and Stamina;Able to understand and use rate of perceived exertion (RPE) scale;Knowledge and understanding of Target Heart Rate Range (THRR);Able to check pulse  independently;Understanding of Exercise Prescription  Increase Physical Activity;Increase Strength and Stamina;Understanding of Exercise Prescription  Increase Physical Activity;Increase Strength and Stamina;Able to understand and use rate of perceived exertion (RPE) scale;Knowledge and understanding of Target Heart Rate Range (THRR);Able to check pulse independently;Understanding of Exercise Prescription  Increase Physical Activity;Increase Strength and Stamina;Understanding of Exercise Prescription   Comments  Reviewed RPE scale, THR and program prescription with pt today.  Pt voiced understanding and was given a copy of goals to take home.  Marvin Jefferson has done very well so far.  The elliptical is most challengin for him - her reproted being very tired after the first session he did elliptical.  He was taken off of one of his BP meds and felt much more energy since then.  He is cleared by his Dr to use 10 lb weights.  Marvin Jefferson is doing well in rehab.  He is enjoying come to class and getting back into exercise again.  He used to run and bike and now walks.  He used to be up to a 1-2 miles a day.Reviewed home exercise with pt today.  Pt plans to walking and using weights at home for exercise.  He also belongs  [facility] [employer] once it reopens. Reviewed THR, pulse, RPE, sign and symptoms, NTG use, and when to call 911 or MD.  Also discussed weather considerations and indoor options.  Pt voiced understanding.  Marvin Jefferson is progressing well and has increased MET level.  he uses 10 lb weights for strength work.Marvin Jefferson is doing well in rehab.  He is already up to 76 watts on the recumbent bike and level 9 on the BioStep.  We will continue to monitor his progress.   Expected Outcomes  Short: Use RPE daily to regulate intensity. Long: Follow program prescription in THR.  Short - continue to attend consistently Long - increase overall MET level  Short: Start to add home exercise back in.  Long: Continue to increase stamina.  Short -  continue to progress workloads Long - increase overall MET level  Short: Continue to push on treadmill.  Long: Continue to exericse on off days.   [place] Name 07/31/19 1028 08/09/19 1610 08/10/19 1045 08/22/19 1315  Exercise Goal Re-Evaluation   Exercise Goals Review  Increase Physical Activity;Increase Strength and Stamina;Understanding of Exercise Prescription  Increase Physical Activity;Increase Strength and Stamina;Able to understand and use rate of perceived exertion (RPE) scale;Knowledge and understanding of Target Heart Rate Range (THRR);Able to check pulse independently;Understanding of Exercise Prescription  Increase Physical Activity;Increase Strength and Stamina  Increase Physical Activity;Increase Strength and Stamina    Comments  Marvin Jefferson continues to do well in rehab.  He was doing 3.9 mph on the ellipitcal!!  He is also up to 68 watts on the recumbent bike.  We will continue to monitor his progress.  Marvin Jefferson has improved overall MET level.  He is on level 11 on the elliptical and does up to 10% grade on TM.  Marvin Jefferson and his wife walk around his block for exercise. He does push  ups and tries to stay active when he is not in rehab. He wants to [facility] back [facility] [employer] or the [facility] when he is done with the program.  Marvin Jefferson is nearing graduation.  He improved his post 6MWT by over 300 ft!  He is planning to join a gym to continue to exercise.    Expected Outcomes  Short: Continue to increase workloads.  Long: Continue to improve stamina.  Short - continue to attend consistently Long : maintain fitness gains  Short: continue to exercise regularly. Long: joing [employer] or the [employer].  Short: Graduate!!  Long: Continue to exercise independently.       Nutrition & Weight - Outcomes: Pre Biometrics - 05/28/19 1323      Pre Biometrics   Height  5' 8.25" (1.734 m)    Weight  204 lb (92.5 kg)    BMI (Calculated)  30.78    Single Leg Stand  2.4 seconds        Nutrition: Nutrition Therapy &  Goals - 05/28/19 1200      Nutrition Therapy   Diet  low Na, HH diet    Protein (specify units)  75g    Fiber  30 grams    Whole Grain Foods  3 servings    Saturated Fats  12 max. grams    Fruits and Vegetables  5 servings/day    Sodium  1.5 grams      Personal Nutrition Goals   Nutrition Goal  ST: have at least 2 servings of Pro per day and sneak in some more (like soymilk) LT: walk better and get stronger.    Comments  Spoke with pt and wife. B pt has cereal with almond milk and coffee (creamer/splenda). L salad with veggies olive oil and vinegar and some chickpeas (a couple of tbsp) or some peanut butter and fruit or tomato sandwich on homemade bread and dukes mayo or fruit and cottage cheese. D: chicken sald or tuna sald or buger or rice and beans; usually just steamed vegetables and mashed potatoes (smart balance with olive oil). Pt reports using canola oil in baking. Pt reports not liking meat too much, discussed other ways to eat consistent protein like switching to soymilk or adding more chickpeas to salad, or when having yogurt and fruit to have greek yogurt which is higher in protein. Pt has been watching Na, chosing low salt options. Discussed HH eating.      Intervention Plan   Intervention  Prescribe, educate and counsel regarding individualized specific dietary modifications aiming towards targeted core components such as weight, hypertension, lipid management, diabetes, heart failure and other comorbidities.;Nutrition handout(s) given  to patient.    Expected Outcomes  Short Term Goal: Understand basic principles of dietary content, such as calories, fat, sodium, cholesterol and nutrients.;Short Term Goal: A plan has been developed with personal nutrition goals set during dietitian appointment.;Long Term Goal: Adherence to prescribed nutrition plan.       Nutrition Discharge: Nutrition Assessments - 05/24/19 1053      MEDFICTS Scores   Pre Score  20       Education  Questionnaire Score: Knowledge Questionnaire Score - 05/24/19 1052      Knowledge Questionnaire Score   Pre Score  26/26       Goals reviewed with patient; copy given to patient.

## 2019-08-24 NOTE — Progress Notes (Signed)
Daily Session Note  Patient Details  Name: Marvin Jefferson MRN: [id number] Date of Birth: 05-Nov-1940 Referring Provider:     Cardiac Rehab from 05/28/2019 in [facility]  Referring Provider  Paraschos      Encounter Date: 08/24/2019  Check In: Session Check In - 08/24/19 1051      Check-In   Supervising physician immediately available to respond to emergencies  See telemetry face sheet for immediately available ER MD    Location  [facility]-Cardiac & Pulmonary Rehab    Staff Present  Renita Papa, RN BSN;Jessica Luan Pulling, MA, RCEP, CCRP, CCET;Joseph Victoria RCP,RRT,BSRT    Virtual Visit  No    Medication changes reported      No    Fall or balance concerns reported     No    Warm-up and Cool-down  Performed on first and last piece of equipment    Resistance Training Performed  Yes    VAD Patient?  No    PAD/SET Patient?  No      Pain Assessment   Currently in Pain?  No/denies          Social History   Tobacco Use  Smoking Status Former Smoker  . Quit date: 08/02/1962  . Years since quitting: 57.0  Smokeless Tobacco Never Used    Goals Met:  Independence with exercise equipment Exercise tolerated well No report of cardiac concerns or symptoms Strength training completed today  Goals Unmet:  Not Applicable  Comments: Pt able to follow exercise prescription today without complaint.  Will continue to monitor for progression.    Dr. Emily Filbert is Medical Director for [place] and [facility].

## 2019-08-28 ENCOUNTER — Encounter: Payer: Medicare Other | Admitting: *Deleted

## 2019-08-28 ENCOUNTER — Other Ambulatory Visit: Payer: Self-pay

## 2019-08-28 DIAGNOSIS — I213 ST elevation (STEMI) myocardial infarction of unspecified site: Secondary | ICD-10-CM

## 2019-08-28 NOTE — Progress Notes (Signed)
Daily Session Note  Patient Details  Name: Marvin Jefferson MRN: [id number] Date of Birth: 02-03-1941 Referring Provider:     Cardiac Rehab from 05/28/2019 in [facility]  Referring Provider  Paraschos      Encounter Date: 08/28/2019  Check In: Session Check In - 08/28/19 1046      Check-In   Supervising physician immediately available to respond to emergencies  See telemetry face sheet for immediately available ER MD    Location  [facility]-Cardiac & Pulmonary Rehab    Staff Present  Heath Lark, RN, BSN, CCRP;Joseph Hood RCP,RRT,BSRT    Virtual Visit  No    Medication changes reported      No    Fall or balance concerns reported     No    Warm-up and Cool-down  Performed on first and last piece of equipment    Resistance Training Performed  Yes    VAD Patient?  No    PAD/SET Patient?  No      Pain Assessment   Currently in Pain?  No/denies          Social History   Tobacco Use  Smoking Status Former Smoker  . Quit date: 08/02/1962  . Years since quitting: 57.1  Smokeless Tobacco Never Used    Goals Met:  Independence with exercise equipment Exercise tolerated well No report of cardiac concerns or symptoms  Goals Unmet:  Not Applicable  Comments: Pt able to follow exercise prescription today without complaint.  Will continue to monitor for progression.  Marvin Jefferson graduated today from  rehab with 36 sessions completed.  Details of the patient's exercise prescription and what He needs to do in order to continue the prescription and progress were discussed with patient.  Patient was given a copy of prescription and goals.  Patient verbalized understanding.  Marvin Jefferson plans to continue to exercise by going to the [employer] as soon as he can enroll..    Dr. Emily Filbert is Medical Director for [place] [facility].

## 2019-08-28 NOTE — Progress Notes (Signed)
Cardiac Individual Treatment Plan  Patient Details  Name: Marvin Jefferson MRN: [id number] Date of Birth: 1940/11/12 Referring Provider:     Cardiac Rehab from 05/28/2019 in [facility] Cardiac and Pulmonary Rehab  Referring Provider  Paraschos      Initial Encounter Date:    Cardiac Rehab from 05/28/2019 in [facility] Cardiac and Pulmonary Rehab  Date  05/28/19      Visit Diagnosis: ST elevation myocardial infarction (STEMI), unspecified artery ([place])  Patient's Home Medications on Admission:  Current Outpatient Medications:  .  acetaminophen (TYLENOL) 500 MG tablet, Take 1,000 mg by mouth every 6 (six) hours as needed for mild pain. , Disp: , Rfl:  .  ALPRAZolam (XANAX) 0.25 MG tablet, Take 0.25 mg by mouth 2 (two) times daily as needed (dizziness). , Disp: , Rfl:  .  amLODipine (NORVASC) 5 MG tablet, Take by mouth., Disp: , Rfl:  .  aspirin 81 MG chewable tablet, Chew 1 tablet (81 mg total) by mouth daily., Disp: 30 tablet, Rfl: 0 .  atorvastatin (LIPITOR) 80 MG tablet, Take 1 tablet (80 mg total) by mouth daily at 6 PM., Disp: 30 tablet, Rfl: 0 .  Cholecalciferol (VITAMIN D3) 2000 units capsule, Take 2,000 Units by mouth daily. , Disp: , Rfl:  .  clopidogrel (PLAVIX) 75 MG tablet, Take 75 mg by mouth daily., Disp: , Rfl:  .  docusate sodium (COLACE) 100 MG capsule, Take 100 mg by mouth daily. , Disp: , Rfl:  .  FLUoxetine (PROZAC) 20 MG capsule, Take 20 mg by mouth daily., Disp: , Rfl:  .  fluticasone (FLONASE) 50 MCG/ACT nasal spray, USE 2 SPRAYS IN EACH NOSTRIL EVERY DAY AS NEEDED FOR ALLERGIES, Disp: , Rfl:  .  gabapentin (NEURONTIN) 100 MG capsule, Take 100 mg by mouth 3 (three) times daily., Disp: , Rfl:  .  galantamine (RAZADYNE) 12 MG tablet, Take 12 mg by mouth 2 (two) times daily. , Disp: , Rfl:  .  hydrocortisone cream 1 %, Apply 1 application topically daily as needed for itching., Disp: , Rfl:  .  lactulose (CHRONULAC) 10 GM/15ML solution, Take 30 mLs (20 g total) by mouth daily as  needed for mild constipation., Disp: 120 mL, Rfl: 0 .  lisinopril (ZESTRIL) 2.5 MG tablet, Take 1 tablet (2.5 mg total) by mouth daily., Disp: 30 tablet, Rfl: 0 .  meclizine (ANTIVERT) 25 MG tablet, Take 25 mg by mouth 3 (three) times daily as needed for dizziness., Disp: , Rfl:  .  Melatonin 5 MG TABS, Take 1 tablet (5 mg total) by mouth at bedtime., Disp: 30 tablet, Rfl: 0 .  memantine (NAMENDA) 5 MG tablet, Take 5 mg by mouth 2 (two) times a day., Disp: , Rfl:  .  methocarbamol (ROBAXIN) 500 MG tablet, Take 500 mg by mouth 3 (three) times daily as needed for muscle spasms., Disp: , Rfl:  .  methylphenidate (RITALIN) 5 MG tablet, Take 1 tablet by mouth 2 (two) times daily., Disp: , Rfl:  .  metoprolol tartrate (LOPRESSOR) 25 MG tablet, Take by mouth., Disp: , Rfl:  .  Multiple Vitamin (MULTIVITAMIN WITH MINERALS) TABS tablet, Take 1 tablet by mouth daily., Disp: 30 tablet, Rfl: 0 .  neomycin-bacitracin-polymyxin (NEOSPORIN) ointment, Apply 1 application topically daily as needed for wound care. apply to eye, Disp: , Rfl:  .  nystatin cream (MYCOSTATIN), Apply 1 application topically 2 (two) times daily., Disp: 30 g, Rfl: 0 .  OVER THE COUNTER MEDICATION, Place 0.75 mLs under the  tongue at bedtime., Disp: , Rfl:  .  Polyethylene Glycol 3350 (PEG 3350) 17 GM/SCOOP POWD, Take 17 g by mouth daily., Disp: , Rfl:  .  rivastigmine (EXELON) 9.5 mg/24hr, Place 1 patch onto the skin daily., Disp: , Rfl:  .  sodium chloride (OCEAN) 0.65 % SOLN nasal spray, Place 2 sprays into both nostrils 2 (two) times daily as needed for congestion., Disp: , Rfl:  .  tamsulosin (FLOMAX) 0.4 MG CAPS capsule, Take 1 capsule (0.4 mg total) by mouth daily., Disp: 30 capsule, Rfl: 2 .  terazosin (HYTRIN) 5 MG capsule, Take 5 mg by mouth 2 (two) times daily. , Disp: , Rfl:  .  traMADol (ULTRAM) 50 MG tablet, Take 50 mg by mouth every 6 (six) hours as needed for moderate pain. , Disp: , Rfl:  .  vitamin E 1000 UNIT capsule, Take  1,000 Units by mouth daily. , Disp: , Rfl:   Past Medical History: Past Medical History:  Diagnosis Date  . Anxiety   . Chronic back pain   . DDD (degenerative disc disease), cervical   . DDD (degenerative disc disease), lumbar   . Dementia ([place])   . Depression   . Hypertension   . Meniere's disease   . Pneumonia 2015  . Umbilical hernia   . UTI (urinary tract infection)     Tobacco Use: Social History   Tobacco Use  Smoking Status Former Smoker  . Quit date: 08/02/1962  . Years since quitting: 57.1  Smokeless Tobacco Never Used    Labs: Recent [employer] for ITP Cardiac and Pulmonary Rehab Latest Ref Rng & Units 04/19/2019   Cholestrol 0 - 200 mg/dL 162   LDLCALC 0 - 99 mg/dL 104(H)   HDL >40 mg/dL 44   Trlycerides <150 mg/dL 70       Exercise Target Goals: Exercise Program Goal: Individual exercise prescription set using results from initial 6 min walk test and THRR while considering  patient's activity barriers and safety.   Exercise Prescription Goal: Initial exercise prescription builds to 30-45 minutes a day of aerobic activity, 2-3 days per week.  Home exercise guidelines will be given to patient during program as part of exercise prescription that the participant will acknowledge.  Activity Barriers & Risk Stratification: Activity Barriers & Cardiac Risk Stratification - 05/23/19 1119      Activity Barriers & Cardiac Risk Stratification   Activity Barriers  Balance Concerns;Back Problems;Other (comment)   DJD Neck and Back, "crippled when wakes up-sits with ice pack for 30 min and is better to move   Comments  Left foot goes to sleep when walks or stands for a long time.   Wll improve after more movement    Cardiac Risk Stratification  Moderate       6 Minute Walk: 6 Minute Walk    Row Name 05/28/19 1314 08/17/19 1108       6 Minute Walk   Phase  Initial  Discharge    Distance  1532 feet  1835 feet    Distance % Change  -  19.8  %    Distance Feet Change  -  303 ft    Walk Time  6 minutes  6 minutes    # of Rest Breaks  0  0    MPH  2.9  3.47    METS  2.7  3.75    RPE  7  15    Perceived Dyspnea  0  -    VO2 Peak  9.35  13.12    Symptoms  No  No    Resting HR  58 bpm  65 bpm    Resting BP  108/70  120/68    Resting Oxygen Saturation   98 %  -    Exercise Oxygen Saturation  during 6 min walk  94 %  -    Max Ex. HR  92 bpm  105 bpm    Max Ex. BP  132/66  178/86    2 Minute Post BP  114/70  -       Oxygen Initial Assessment:   Oxygen Re-Evaluation:   Oxygen Discharge (Final Oxygen Re-Evaluation):   Initial Exercise Prescription: Initial Exercise Prescription - 05/28/19 1300      Date of Initial Exercise RX and Referring Provider   Date  05/28/19    Referring Provider  Paraschos      Treadmill   MPH  2.5    Grade  0.5    Minutes  15    METs  3.09      Recumbant Bike   Level  3    RPM  60    Watts  20    Minutes  15    METs  2.7      NuStep   Level  4    SPM  80    Minutes  15    METs  2.7      Elliptical   Level  1    Speed  3    Minutes  15      Biostep-RELP   Level  4    SPM  50    Minutes  15    METs  3      Prescription Details   Frequency (times per week)  3    Duration  Progress to 30 minutes of continuous aerobic without signs/symptoms of physical distress      Intensity   THRR 40-80% of Max Heartrate  92-126    Ratings of Perceived Exertion  11-13    Perceived Dyspnea  0-4      Progression   Progression  Continue to progress workloads to maintain intensity without signs/symptoms of physical distress.      Resistance Training   Training Prescription  Yes    Weight  8 lb    Reps  10-15       Perform Capillary Blood Glucose checks as needed.  Exercise Prescription Changes: Exercise Prescription Changes    Row Name 06/08/19 1000 06/27/19 1300 07/10/19 1600 07/31/19 1000 08/09/19 1600     Response to Exercise   Blood Pressure (Admit)  132/78  128/70   134/76  116/68  120/64   Blood Pressure (Exercise)  142/74  152/76  128/70  134/74  152/78   Blood Pressure (Exit)  104/62  112/66  128/64  108/64  110/64   Heart Rate (Admit)  56 bpm  50 bpm  61 bpm  66 bpm  65 bpm   Heart Rate (Exercise)  90 bpm  99 bpm  102 bpm  103 bpm  101 bpm   Heart Rate (Exit)  66 bpm  60 bpm  73 bpm  74 bpm  64 bpm   Rating of Perceived Exertion (Exercise)  _0 Symptoms  none  none  none  none  none   Duration  Continue with 30 min  of aerobic exercise without signs/symptoms of physical distress.  Continue with 30 min of aerobic exercise without signs/symptoms of physical distress.  Continue with 30 min of aerobic exercise without signs/symptoms of physical distress.  Continue with 30 min of aerobic exercise without signs/symptoms of physical distress.  Continue with 30 min of aerobic exercise without signs/symptoms of physical distress.   Intensity  THRR unchanged  THRR unchanged  THRR unchanged  THRR unchanged  THRR unchanged     Progression   Progression  Continue to progress workloads to maintain intensity without signs/symptoms of physical distress.  Continue to progress workloads to maintain intensity without signs/symptoms of physical distress.  Continue to progress workloads to maintain intensity without signs/symptoms of physical distress.  Continue to progress workloads to maintain intensity without signs/symptoms of physical distress.  Continue to progress workloads to maintain intensity without signs/symptoms of physical distress.   Average METs  2.9  4  4.11  3.92  5.25     Resistance Training   Training Prescription  Yes  Yes  Yes  Yes  Yes   Weight  10 lb  10 lb  10 lbs  10 lbs  10 lb   Reps  10-15  10-15  10-15  10-15  10-15     Interval Training   Interval Training  No  -  No  No  -     Treadmill   MPH  2.5  2.5  2.8  3  -   Grade  0.5  0.5  1.5  2  -   Minutes  '15  15  15  15  '$ -   METs  3.09  3.09  3.72  4.12  -     Recumbant  Bike   Level  '3  3  12  12  '$ -   RPM  60  60  -  -  -   Watts  -  66  76  68  -   Minutes  '15  15  15  15  '$ -   METs  2.7  4.3  4.62  4.45  -     NuStep   Level  -  -  -  5  -   Minutes  -  -  -  15  -   METs  -  -  -  4.1  -     Elliptical   Level  '1  1  1  1  '$ -   Speed  -  3  3  3.9  -   Minutes  '15  15  15  15  '$ -     Biostep-RELP   Level  '4  5  9  11  '$ -   SPM  50  50  -  -  -   Minutes  '15  15  15  15  '$ -   METs  '3  5  4  3  '$ -     Home Exercise Plan   Plans to continue exercise at  -  -  Home (comment) walking  Home (comment) walking  -   Frequency  -  -  Add 2 additional days to program exercise sessions.  Add 2 additional days to program exercise sessions.  -   Initial Home Exercises Provided  -  -  06/14/19  06/14/19  -   [place] Name 08/22/19 1300             Response  to Exercise   Blood Pressure (Admit)  120/68       Blood Pressure (Exercise)  178/86       Blood Pressure (Exit)  120/62       Heart Rate (Admit)  66 bpm       Heart Rate (Exercise)  105 bpm       Heart Rate (Exit)  67 bpm       Rating of Perceived Exertion (Exercise)  14       Symptoms  none       Duration  Continue with 30 min of aerobic exercise without signs/symptoms of physical distress.       Intensity  THRR unchanged         Progression   Progression  Continue to progress workloads to maintain intensity without signs/symptoms of physical distress.       Average METs  6.69         Resistance Training   Training Prescription  Yes       Weight  10 lbs       Reps  10-15         Interval Training   Interval Training  No         Treadmill   MPH  3       Grade  10       Minutes  15       METs  7.4         Recumbant Bike   Level  11       Watts  74       Minutes  15       METs  4.48         NuStep   Level  4       Minutes  15       METs  4.2         Elliptical   Level  1       Speed  3.9       Minutes  15         Biostep-RELP   Level  11       Minutes  15       METs  4          Home Exercise Plan   Plans to continue exercise at  Home (comment) walking       Frequency  Add 2 additional days to program exercise sessions.       Initial Home Exercises Provided  06/14/19          Exercise Comments: Exercise Comments    Row Name 05/29/19 1054           Exercise Comments  First full day of exercise!  Patient was oriented to gym and equipment including functions, settings, policies, and procedures.  Patient's individual exercise prescription and treatment plan were reviewed.  All starting workloads were established based on the results of the 6 minute walk test done at initial orientation visit.  The plan for exercise progression was also introduced and progression will be customized based on patient's performance and goals.          Exercise Goals and Review: Exercise Goals    Row Name 05/28/19 1323             Exercise Goals   Increase Physical Activity  Yes       Expected Outcomes  Short Term: Attend rehab on a regular basis to increase  amount of physical activity.;Long Term: Add in home exercise to make exercise part of routine and to increase amount of physical activity.;Long Term: Exercising regularly at least 3-5 days a week.       Increase Strength and Stamina  Yes       Intervention  Provide advice, education, support and counseling about physical activity/exercise needs.;Develop an individualized exercise prescription for aerobic and resistive training based on initial evaluation findings, risk stratification, comorbidities and participant's personal goals.       Expected Outcomes  Short Term: Increase workloads from initial exercise prescription for resistance, speed, and METs.;Short Term: Perform resistance training exercises routinely during rehab and add in resistance training at home;Long Term: Improve cardiorespiratory fitness, muscular endurance and strength as measured by increased METs and functional capacity (6MWT)       Able to understand  and use rate of perceived exertion (RPE) scale  Yes       Intervention  Provide education and explanation on how to use RPE scale       Expected Outcomes  Short Term: Able to use RPE daily in rehab to express subjective intensity level;Long Term:  Able to use RPE to guide intensity level when exercising independently       Knowledge and understanding of Target Heart Rate Range (THRR)  Yes       Intervention  Provide education and explanation of THRR including how the numbers were predicted and where they are located for reference       Expected Outcomes  Short Term: Able to state/look up THRR;Short Term: Able to use daily as guideline for intensity in rehab;Long Term: Able to use THRR to govern intensity when exercising independently       Able to check pulse independently  Yes       Intervention  Provide education and demonstration on how to check pulse in carotid and radial arteries.;Review the importance of being able to check your own pulse for safety during independent exercise       Expected Outcomes  Short Term: Able to explain why pulse checking is important during independent exercise;Long Term: Able to check pulse independently and accurately       Understanding of Exercise Prescription  Yes       Intervention  Provide education, explanation, and written materials on patient's individual exercise prescription       Expected Outcomes  Short Term: Able to explain program exercise prescription;Long Term: Able to explain home exercise prescription to exercise independently          Exercise Goals Re-Evaluation : Exercise Goals Re-Evaluation    Row Name 05/29/19 1054 06/08/19 1006 06/14/19 1118 06/27/19 1322 07/10/19 1603     Exercise Goal Re-Evaluation   Exercise Goals Review  Increase Physical Activity;Increase Strength and Stamina;Able to understand and use rate of perceived exertion (RPE) scale;Able to understand and use Dyspnea scale;Knowledge and understanding of Target Heart Rate  Range (THRR);Understanding of Exercise Prescription  Increase Physical Activity;Increase Strength and Stamina;Able to understand and use rate of perceived exertion (RPE) scale;Knowledge and understanding of Target Heart Rate Range (THRR);Able to check pulse independently;Understanding of Exercise Prescription  Increase Physical Activity;Increase Strength and Stamina;Understanding of Exercise Prescription  Increase Physical Activity;Increase Strength and Stamina;Able to understand and use rate of perceived exertion (RPE) scale;Knowledge and understanding of Target Heart Rate Range (THRR);Able to check pulse independently;Understanding of Exercise Prescription  Increase Physical Activity;Increase Strength and Stamina;Understanding of Exercise Prescription   Comments  Reviewed RPE scale, THR  and program prescription with pt today.  Pt voiced understanding and was given a copy of goals to take home.  Gershon Mussel has done very well so far.  The elliptical is most challengin for him - her reproted being very tired after the first session he did elliptical.  He was taken off of one of his BP meds and felt much more energy since then.  He is cleared by his Dr to use 10 lb weights.  Gershon Mussel is doing well in rehab.  He is enjoying come to class and getting back into exercise again.  He used to run and bike and now walks.  He used to be up to a 1-2 miles a day.Reviewed home exercise with pt today.  Pt plans to walking and using weights at home for exercise.  He also belongs  [facility] [employer] once it reopens. Reviewed THR, pulse, RPE, sign and symptoms, NTG use, and when to call 911 or MD.  Also discussed weather considerations and indoor options.  Pt voiced understanding.  Gershon Mussel is progressing well and has increased MET level.  he uses 10 lb weights for strength work.Gershon Mussel is doing well in rehab.  He is already up to 76 watts on the recumbent bike and level 9 on the BioStep.  We will continue to monitor his progress.   Expected Outcomes   Short: Use RPE daily to regulate intensity. Long: Follow program prescription in THR.  Short - continue to attend consistently Long - increase overall MET level  Short: Start to add home exercise back in.  Long: Continue to increase stamina.  Short - continue to progress workloads Long - increase overall MET level  Short: Continue to push on treadmill.  Long: Continue to exericse on off days.   [place] Name 07/31/19 1028 08/09/19 1610 08/10/19 1045 08/22/19 1315       Exercise Goal Re-Evaluation   Exercise Goals Review  Increase Physical Activity;Increase Strength and Stamina;Understanding of Exercise Prescription  Increase Physical Activity;Increase Strength and Stamina;Able to understand and use rate of perceived exertion (RPE) scale;Knowledge and understanding of Target Heart Rate Range (THRR);Able to check pulse independently;Understanding of Exercise Prescription  Increase Physical Activity;Increase Strength and Stamina  Increase Physical Activity;Increase Strength and Stamina    Comments  Gershon Mussel continues to do well in rehab.  He was doing 3.9 mph on the ellipitcal!!  He is also up to 68 watts on the recumbent bike.  We will continue to monitor his progress.  Gershon Mussel has improved overall MET level.  He is on level 11 on the elliptical and does up to 10% grade on TM.  Tom and his wife walk around his block for exercise. He does push  ups and tries to stay active when he is not in rehab. He wants to go back to [employer] or the [facility] when he is done with the program.  Gershon Mussel is nearing graduation.  He improved his post 6MWT by over 300 ft!  He is planning to join a gym to continue to exercise.    Expected Outcomes  Short: Continue to increase workloads.  Long: Continue to improve stamina.  Short - continue to attend consistently Long : maintain fitness gains  Short: continue to exercise regularly. Long: joing [employer] or the [employer].  Short: Graduate!!  Long: Continue to exercise independently.        Discharge Exercise Prescription (Final Exercise Prescription Changes): Exercise Prescription Changes - 08/22/19 1300  Response to Exercise   Blood Pressure (Admit)  120/68    Blood Pressure (Exercise)  178/86    Blood Pressure (Exit)  120/62    Heart Rate (Admit)  66 bpm    Heart Rate (Exercise)  105 bpm    Heart Rate (Exit)  67 bpm    Rating of Perceived Exertion (Exercise)  14    Symptoms  none    Duration  Continue with 30 min of aerobic exercise without signs/symptoms of physical distress.    Intensity  THRR unchanged      Progression   Progression  Continue to progress workloads to maintain intensity without signs/symptoms of physical distress.    Average METs  6.69      Resistance Training   Training Prescription  Yes    Weight  10 lbs    Reps  10-15      Interval Training   Interval Training  No      Treadmill   MPH  3    Grade  10    Minutes  15    METs  7.4      Recumbant Bike   Level  11    Watts  74    Minutes  15    METs  4.48      NuStep   Level  4    Minutes  15    METs  4.2      Elliptical   Level  1    Speed  3.9    Minutes  15      Biostep-RELP   Level  11    Minutes  15    METs  4      Home Exercise Plan   Plans to continue exercise at  Home (comment)   walking   Frequency  Add 2 additional days to program exercise sessions.    Initial Home Exercises Provided  06/14/19       Nutrition:  Target Goals: Understanding of nutrition guidelines, daily intake of sodium '1500mg'$ , cholesterol '200mg'$ , calories 30% from fat and 7% or less from saturated fats, daily to have 5 or more servings of fruits and vegetables.  Biometrics: Pre Biometrics - 05/28/19 1323      Pre Biometrics   Height  5' 8.25" (1.734 m)    Weight  204 lb (92.5 kg)    BMI (Calculated)  30.78    Single Leg Stand  2.4 seconds        Nutrition Therapy Plan and Nutrition Goals: Nutrition Therapy & Goals - 05/28/19 1200      Nutrition Therapy   Diet  low  Na, HH diet    Protein (specify units)  75g    Fiber  30 grams    Whole Grain Foods  3 servings    Saturated Fats  12 max. grams    Fruits and Vegetables  5 servings/day    Sodium  1.5 grams      Personal Nutrition Goals   Nutrition Goal  ST: have at least 2 servings of Pro per day and sneak in some more (like soymilk) LT: walk better and get stronger.    Comments  Spoke with pt and wife. B pt has cereal with almond milk and coffee (creamer/splenda). L salad with veggies olive oil and vinegar and some chickpeas (a couple of tbsp) or some peanut butter and fruit or tomato sandwich on homemade bread and dukes mayo or fruit and cottage cheese. D: chicken sald  or tuna sald or buger or rice and beans; usually just steamed vegetables and mashed potatoes (smart balance with olive oil). Pt reports using canola oil in baking. Pt reports not liking meat too much, discussed other ways to eat consistent protein like switching to soymilk or adding more chickpeas to salad, or when having yogurt and fruit to have greek yogurt which is higher in protein. Pt has been watching Na, chosing low salt options. Discussed HH eating.      Intervention Plan   Intervention  Prescribe, educate and counsel regarding individualized specific dietary modifications aiming towards targeted core components such as weight, hypertension, lipid management, diabetes, heart failure and other comorbidities.;Nutrition handout(s) given to patient.    Expected Outcomes  Short Term Goal: Understand basic principles of dietary content, such as calories, fat, sodium, cholesterol and nutrients.;Short Term Goal: A plan has been developed with personal nutrition goals set during dietitian appointment.;Long Term Goal: Adherence to prescribed nutrition plan.       Nutrition Assessments: Nutrition Assessments - 08/27/19 1646      MEDFICTS Scores   Pre Score  20    Post Score  15    Score Difference  -5       Nutrition Goals  Re-Evaluation: Nutrition Goals Re-Evaluation    Row Name 07/10/19 1111 08/21/19 1140           Goals   Nutrition Goal  ST: have at least 2 servings of Pro per day and sneak in some more (like soymilk) LT: walk better and get stronger.  ST: have at least 2 servings of Pro per day and sneak in some more (like soymilk) LT: walk better and get stronger.      Comment  Pt reports that his energy has been abou tthe same and that his diet is still going well, added some more plant based protein like we discussed such as chickpeas. Pt reports his denentia is making him feel less confident, but he is still able to do everything he wants to do.  Continue with current changes      Expected Outcome  ST: have at least 2 servings of Pro per day and sneak in some more (like soymilk) LT: walk better and get stronger.  ST: have at least 2 servings of Pro per day and sneak in some more (like soymilk) LT: walk better and get stronger.         Nutrition Goals Discharge (Final Nutrition Goals Re-Evaluation): Nutrition Goals Re-Evaluation - 08/21/19 1140      Goals   Nutrition Goal  ST: have at least 2 servings of Pro per day and sneak in some more (like soymilk) LT: walk better and get stronger.    Comment  Continue with current changes    Expected Outcome  ST: have at least 2 servings of Pro per day and sneak in some more (like soymilk) LT: walk better and get stronger.       Psychosocial: Target Goals: Acknowledge presence or absence of significant depression and/or stress, maximize coping skills, provide positive support system. Participant is able to verbalize types and ability to use techniques and skills needed for reducing stress and depression.   Initial Review & Psychosocial Screening: Initial Psych Review & Screening - 05/23/19 1122      Initial Review   Current issues with  History of Depression;Current Psychotropic Meds    Source of Stress Concerns  Chronic Illness    Comments  [place]   slow progression- Short  to term memory is starting to degrade.  ABle to follow directions, may not remeber he was in class after he goes home      [place]?  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      Screening Interventions   Interventions  Encouraged to exercise    Expected Outcomes  Short Term goal: Utilizing psychosocial counselor, staff and physician to assist with identification of specific Stressors or current issues interfering with healing process. Setting desired goal for each stressor or current issue identified.;Long Term Goal: Stressors or current issues are controlled or eliminated.;Short Term goal: Identification and review with participant of any Quality of Life or Depression concerns found by scoring the questionnaire.;Long Term goal: The participant improves quality of Life and PHQ9 Scores as seen by post scores and/or verbalization of changes       Quality of Life Scores:  Quality of Life - 08/27/19 1646      Quality of Life   Select  Quality of Life      Quality of Life Scores   Health/Function Pre  20 %    Health/Function Post  23.2 %    Health/Function % Change  16 %    Socioeconomic Pre  24.31 %    Socioeconomic Post  25.68 %    Socioeconomic % Change   5.64 %    Psych/Spiritual Pre  23.14 %    Psych/Spiritual Post  27.42 %    Psych/Spiritual % Change  18.5 %    Family Pre  23.4 %    Family Post  24.6 %    Family % Change  5.13 %    GLOBAL Pre  22.1 %    GLOBAL Post  24.81 %    GLOBAL % Change  12.26 %      Scores of 19 and below usually indicate a poorer quality of life in these areas.  A difference of  2-3 points is a clinically meaningful difference.  A difference of 2-3 points in the total score of the Quality of Life Index has  been associated with significant improvement in overall quality of life, self-image, physical symptoms, and general health in studies assessing change in quality of life.  PHQ-9: Recent Review Flowsheet Data    Depression screen [facility] 2/9 08/24/2019 06/28/2019 05/28/2019   Decreased Interest 0 0 2   Down, Depressed, Hopeless 0 0 1   PHQ - 2 Score 0 0 3   Altered sleeping 0 0 1   Tired, decreased energy 0 1 3   Change in appetite 0 0 0   Feeling bad or failure about yourself  0 0 0   Trouble concentrating 1 0 1   Moving slowly or fidgety/restless 0 0 0   Suicidal thoughts 0 0 0   PHQ-9 Score '1 1 8   '$ Difficult doing work/chores Not difficult at all Not difficult at all Somewhat difficult     Interpretation of Total Score  Total Score Depression Severity:  1-4 = Minimal depression, 5-9 = Mild depression, 10-14 = Moderate depression, 15-19 = Moderately severe depression, 20-27 = Severe depression   Psychosocial Evaluation and Intervention: Psychosocial Evaluation - 08/24/19 1135      Discharge Psychosocial Assessment & Intervention  Comments  Gershon Mussel has done well in rehab and really enjoyed getting to know classmates and staff.  He does his best to stay positive and his deals with his disease process.  He is planning to walk to stay active after graduation.       Psychosocial Re-Evaluation: Psychosocial Re-Evaluation    [place] Name 06/14/19 1119 07/06/19 1054 08/10/19 1049         Psychosocial Re-Evaluation   Current issues with  Current Stress Concerns;Current Depression;Current Psychotropic Meds  Current Depression;Current Sleep Concerns;History of Depression;Current Psychotropic Meds;Current Stress Concerns  Current Depression;Current Sleep Concerns;History of Depression;Current Anxiety/Panic     Comments  Gershon Mussel is doing well in rehab. He is on prozac and doing well.  He has good day and bad days. He views each day as a gift.  He is hoping to make it to 74 as his dad died at 45.  He sleeps  pretty well and uses xanax to help.  He was using melatonin but Dr. Sabra Heck took him off.  If he doesn't take his Xanax he wakes up at 3am.  He is still waking up at night sometimes around 3am. He states he is feeling good and wants to make it to his 78th birthday. 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  Sometimes he gets a little depressed about getting older and forgets things at times. He takes Prozac that helps with his depressions. He has past his dads age and feels more liberated because of it. The pandemic isolations has not affected his mood since his wife and he share common interest.     Expected Outcomes  Short: Continue to work through each day Long: Continue to stay positive.  Short: continue to exersice for his mental health and make it to his 78th birthday. Long:maintain an exercise routine post [facility] to maintain mental health and keep stress at a minumum.  Short: Continue to attend [facility] regularly for regular exercise and social engagement. Long: Continue to improve symptoms and manage a positive mental state.     Interventions  Encouraged to attend Cardiac Rehabilitation for the exercise  Encouraged to attend Cardiac Rehabilitation for the exercise  Encouraged to attend Cardiac Rehabilitation for the exercise     Continue Psychosocial Services   Follow up required by staff  Follow up required by staff  Follow up required by staff        Psychosocial Discharge (Final Psychosocial Re-Evaluation): Psychosocial Re-Evaluation - 08/10/19 1049      Psychosocial Re-Evaluation   Current issues with  Current Depression;Current Sleep Concerns;History of Depression;Current Anxiety/Panic    Comments  Sometimes he gets a little depressed about getting older and forgets things at times. He takes Prozac that helps with his depressions. He has past his dads age and feels more liberated because of it. The pandemic isolations has not affected his mood since his wife and  he share common interest.    Expected Outcomes  Short: Continue to attend [facility] regularly for regular exercise and social engagement. Long: Continue to improve symptoms and manage a positive mental state.    Interventions  Encouraged to attend Cardiac Rehabilitation for the exercise    Continue Psychosocial Services   Follow up required by staff       Vocational Rehabilitation: Provide vocational rehab assistance to qualifying candidates.   Vocational Rehab Evaluation & Intervention: Vocational Rehab - 05/23/19 1129      Initial Vocational Rehab Evaluation & Intervention  Assessment shows need for Vocational Rehabilitation  No       Education: Education Goals: Education classes will be provided on a variety of topics geared toward better understanding of heart health and risk factor modification. Participant will state understanding/return demonstration of topics presented as noted by education test scores.  Learning Barriers/Preferences: Learning Barriers/Preferences - 05/23/19 1128      Learning Barriers/Preferences   Learning Barriers  --   Memory   Learning Preferences  Individual Instruction;Written Material;Video       Education Topics:  AED/CPR: - Group verbal and written instruction with the use of models to demonstrate the basic use of the AED with the basic ABC's of resuscitation.   General Nutrition Guidelines/Fats and Fiber: -Group instruction provided by verbal, written material, models and posters to present the general guidelines for heart healthy nutrition. Gives an explanation and review of dietary fats and fiber.   Controlling Sodium/Reading Food Labels: -Group verbal and written material supporting the discussion of sodium use in heart healthy nutrition. Review and explanation with models, verbal and written materials for utilization of the food label.   Exercise Physiology & General Exercise Guidelines: - Group verbal and written instruction with  models to review the exercise physiology of the cardiovascular system and associated critical values. Provides general exercise guidelines with specific guidelines to those with heart or lung disease.    Aerobic Exercise & Resistance Training: - Gives group verbal and written instruction on the various components of exercise. Focuses on aerobic and resistive training programs and the benefits of this training and how to safely progress through these programs..   Cardiac Rehab from 08/23/2019 in [facility] Cardiac and Pulmonary Rehab  Date  08/23/19  Educator  jh  Instruction Review Code  1- Verbalizes Understanding      Flexibility, Balance, Mind/Body Relaxation: Provides group verbal/written instruction on the benefits of flexibility and balance training, including mind/body exercise modes such as yoga, pilates and tai chi.  Demonstration and skill practice provided.   Stress and Anxiety: - Provides group verbal and written instruction about the health risks of elevated stress and causes of high stress.  Discuss the correlation between heart/lung disease and anxiety and treatment options. Review healthy ways to manage with stress and anxiety.   Depression: - Provides group verbal and written instruction on the correlation between heart/lung disease and depressed mood, treatment options, and the stigmas associated with seeking treatment.   Anatomy & Physiology of the Heart: - Group verbal and written instruction and models provide basic cardiac anatomy and physiology, with the coronary electrical and arterial systems. Review of Valvular disease and Heart Failure   Cardiac Procedures: - Group verbal and written instruction to review commonly prescribed medications for heart disease. Reviews the medication, class of the drug, and side effects. Includes the steps to properly store meds and maintain the prescription regimen. (beta blockers and nitrates)   Cardiac Rehab from 08/23/2019 in [facility]  Cardiac and Pulmonary Rehab  Date  08/23/19  Educator  mc  Instruction Review Code  1- Verbalizes Understanding      Cardiac Medications I: - Group verbal and written instruction to review commonly prescribed medications for heart disease. Reviews the medication, class of the drug, and side effects. Includes the steps to properly store meds and maintain the prescription regimen.   Cardiac Medications II: -Group verbal and written instruction to review commonly prescribed medications for heart disease. Reviews the medication, class of the drug, and side effects. (all other drug  classes)   Cardiac Rehab from 08/09/2019 in [facility] Cardiac and Pulmonary Rehab  Date  08/09/19  Educator  [facility]  Instruction Review Code  1- Verbalizes Understanding       Go Sex-Intimacy & Heart Disease, Get SMART - Goal Setting: - Group verbal and written instruction through game format to discuss heart disease and the return to sexual intimacy. Provides group verbal and written material to discuss and apply goal setting through the application of the S.M.A.R.T. Method.   Cardiac Rehab from 08/23/2019 in [facility] Cardiac and Pulmonary Rehab  Date  08/23/19  Educator  mc  Instruction Review Code  1- Verbalizes Understanding      Other Matters of the Heart: - Provides group verbal, written materials and models to describe Stable Angina and Peripheral Artery. Includes description of the disease process and treatment options available to the cardiac patient.   Exercise & Equipment Safety: - Individual verbal instruction and demonstration of equipment use and safety with use of the equipment.   Cardiac Rehab from 05/28/2019 in [facility] Cardiac and Pulmonary Rehab  Date  05/28/19  Educator  AS  Instruction Review Code  1- Verbalizes Understanding      Infection Prevention: - Provides verbal and written material to individual with discussion of infection control including proper hand washing and proper equipment  cleaning during exercise session.   Cardiac Rehab from 05/28/2019 in [facility] Cardiac and Pulmonary Rehab  Date  05/28/19  Educator  AS  Instruction Review Code  1- Verbalizes Understanding      Falls Prevention: - Provides verbal and written material to individual with discussion of falls prevention and safety.   Cardiac Rehab from 05/28/2019 in [facility] Cardiac and Pulmonary Rehab  Date  05/28/19  Educator  AS  Instruction Review Code  1- Verbalizes Understanding      Diabetes: - Individual verbal and written instruction to review signs/symptoms of diabetes, desired ranges of glucose level fasting, after meals and with exercise. Acknowledge that pre and post exercise glucose checks will be done for 3 sessions at entry of program.   Know Your Numbers and Risk Factors: -Group verbal and written instruction about important numbers in your health.  Discussion of what are risk factors and how they play a role in the disease process.  Review of Cholesterol, Blood Pressure, Diabetes, and BMI and the role they play in your overall health.   Cardiac Rehab from 08/09/2019 in [facility] Cardiac and Pulmonary Rehab  Date  08/09/19  Educator  [facility]  Instruction Review Code  1- Verbalizes Understanding      Sleep Hygiene: -Provides group verbal and written instruction about how sleep can affect your health.  Define sleep hygiene, discuss sleep cycles and impact of sleep habits. Review good sleep hygiene tips.    Other: -Provides group and verbal instruction on various topics (see comments)   Knowledge Questionnaire Score: Knowledge Questionnaire Score - 08/27/19 1646      Knowledge Questionnaire Score   Pre Score  26/26    Post Score  26/26       Core Components/Risk Factors/Patient Goals at Admission: Personal Goals and Risk Factors at Admission - 05/28/19 1324      Core Components/Risk Factors/Patient Goals on Admission    Weight Management  Yes    Admit Weight  204 lb (92.5 kg)     Hypertension  Yes    Intervention  Provide education on lifestyle modifcations including regular physical activity/exercise, weight management, moderate sodium restriction and increased consumption  of fresh fruit, vegetables, and low fat dairy, alcohol moderation, and smoking cessation.;Monitor prescription use compliance.    Expected Outcomes  Short Term: Continued assessment and intervention until BP is < 140/5m HG in hypertensive participants. < 130/863mHG in hypertensive participants with diabetes, heart failure or chronic kidney disease.;Long Term: Maintenance of blood pressure at goal levels.    Lipids  Yes    Intervention  Provide education and support for participant on nutrition & aerobic/resistive exercise along with prescribed medications to achieve LDL '70mg'$ , HDL >'40mg'$ .    Expected Outcomes  Short Term: Participant states understanding of desired cholesterol values and is compliant with medications prescribed. Participant is following exercise prescription and nutrition guidelines.;Long Term: Cholesterol controlled with medications as prescribed, with individualized exercise RX and with personalized nutrition plan. Value goals: LDL < '70mg'$ , HDL > 40 mg.       Core Components/Risk Factors/Patient Goals Review:  Goals and Risk Factor Review    Row Name 06/14/19 1121 07/06/19 1058 08/10/19 1054         Core Components/Risk Factors/Patient Goals Review   Personal Goals Review  Weight Management/Obesity;Hypertension;Lipids  Weight Management/Obesity;Hypertension;Lipids  Weight Management/Obesity;Improve shortness of breath with ADL's;Lipids     Review  Tom's weight has been steady. He is starting to lose now thanks to some diet tweaks.  His blood pressures have improved and he is feeling better overall.  He checks it every morning at home.  He has not noted any problems with his medications.  Tom wants to lose some weight and be as healthy as he can be. His blood pressure has been up and  down lately. His blood pressure has been up and down since some of his medications changes. He is checking his blood pressure at home. He is taking a statin to help with his lipds. He will be on Lipitor for a year.  Toms weight is 197 pounds. Her is watching his eating and wants to reduce his weight to 190 pounds. His lipids were 160 last time he had them checked. He is able to do more at home now and does not get as tired when doing ADL. 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 ToGershon Musseltates that his overall health feels better and that the program has helped him.     Expected Outcomes  Short: Continue to work on weight. Long: Continue to monitor risk factors.  Short: lose 5 pounds in the next two weeks. Long: Maintain weight loss post [facility] independently.  Short: Exercise and take medications as prescribed to help with overall health. Long: maintain exercise and medications independently to improve upon overall health.        Core Components/Risk Factors/Patient Goals at Discharge (Final Review):  Goals and Risk Factor Review - 08/10/19 1054      Core Components/Risk Factors/Patient Goals Review   Personal Goals Review  Weight Management/Obesity;Improve shortness of breath with ADL's;Lipids    Review  Toms weight is 197 pounds. Her is watching his eating and wants to reduce his weight to 190 pounds. His lipids were 160 last time he had them checked. He is able to do more at home now and does not get as tired when doing ADL. 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 ToGershon Musseltates that his overall health feels better and that the program has helped him.    Expected Outcomes  Short: Exercise and take medications as prescribed to help with overall  health. Long: maintain exercise and medications independently to improve upon overall health.       ITP Comments: ITP Comments    Row Name 05/23/19 1137 05/28/19 1311 05/30/19 0554 06/27/19 0556 07/25/19 1336    ITP Comments  Virtual [facility]All for Cardiac Rehab Orientation completed. Has appt on 8/10 for EP/RD Evals and gym orientaiton.  Documentation of diagnosis can be found in [facility] 04/19/19  Completed 6MWT and initial nutrition evaluation  30 Day Review Completed today. Continue with ITP unless changed by Medical Director review.   New to program  30 Day review. Continue with ITP unless directed changes per Medical Director review.  30 day review completed. ITP sent to Dr. Emily Filbert, Medical Director of Cardiac and Pulmonary Rehab. Continue with ITP unless changes are made by physician.  Department closed starting 10/2 until further notice by infection prevention and Health at Work teams for [place].   [place] Name 08/22/19 0620 08/28/19 1047         ITP Comments  30 day review completed. Continue with ITP sent to Dr. Emily Filbert, Medical Director of Cardiac and Pulmonary Rehab for review , changes as needed and signature.  Kayde graduated today from  rehab with 36 sessions completed.  Details of the patient's exercise prescription and what He needs to do in order to continue the prescription and progress were discussed with patient.  Patient was given a copy of prescription and goals.  Patient verbalized understanding.  Geroge plans to continue to exercise by going to the [place] as soon as he can enroll..         Comments: discharge

## 2019-08-28 NOTE — Progress Notes (Signed)
Discharge Progress Report  Patient Details  Name: Marvin Jefferson MRN: [id number] Date of Birth: [birth year] Referring Provider:     Cardiac Rehab from 05/28/2019 in [facility]  Referring Provider  Paraschos       Number of Visits: 77  Reason for Discharge:  Patient reached a stable level of exercise. Patient independent in their exercise. Patient has met program and personal goals.  Smoking History:  Social History   Tobacco Use  Smoking Status Former Smoker  . Quit date: 08/02/1962  . Years since quitting: 57.1  Smokeless Tobacco Never Used    Diagnosis:  ST elevation myocardial infarction (STEMI), unspecified artery ([place])  ADL [facility]SD:   Initial Exercise Prescription: Initial Exercise Prescription - 05/28/19 1300      Date of Initial Exercise RX and Referring Provider   Date  05/28/19    Referring Provider  Paraschos      Treadmill   MPH  2.5    Grade  0.5    Minutes  15    METs  3.09      Recumbant Bike   Level  3    RPM  60    Watts  20    Minutes  15    METs  2.7      NuStep   Level  4    SPM  80    Minutes  15    METs  2.7      Elliptical   Level  1    Speed  3    Minutes  15      Biostep-RELP   Level  4    SPM  50    Minutes  15    METs  3      Prescription Details   Frequency (times per week)  3    Duration  Progress to 30 minutes of continuous aerobic without signs/symptoms of physical distress      Intensity   THRR 40-80% of Max Heartrate  92-126    Ratings of Perceived Exertion  11-13    Perceived Dyspnea  0-4      Progression   Progression  Continue to progress workloads to maintain intensity without signs/symptoms of physical distress.      Resistance Training   Training Prescription  Yes    Weight  8 lb    Reps  10-15       Discharge Exercise Prescription (Final Exercise Prescription Changes): Exercise Prescription Changes - 08/22/19 1300      Response to Exercise   Blood Pressure (Admit)   120/68    Blood Pressure (Exercise)  178/86    Blood Pressure (Exit)  120/62    Heart Rate (Admit)  66 bpm    Heart Rate (Exercise)  105 bpm    Heart Rate (Exit)  67 bpm    Rating of Perceived Exertion (Exercise)  14    Symptoms  none    Duration  Continue with 30 min of aerobic exercise without signs/symptoms of physical distress.    Intensity  THRR unchanged      Progression   Progression  Continue to progress workloads to maintain intensity without signs/symptoms of physical distress.    Average METs  6.69      Resistance Training   Training Prescription  Yes    Weight  10 lbs    Reps  10-15      Interval Training   Interval Training  No  Treadmill   MPH  3    Grade  10    Minutes  15    METs  7.4      Recumbant Bike   Level  11    Watts  74    Minutes  15    METs  4.48      NuStep   Level  4    Minutes  15    METs  4.2      Elliptical   Level  1    Speed  3.9    Minutes  15      Biostep-RELP   Level  11    Minutes  15    METs  4      Home Exercise Plan   Plans to continue exercise at  Home (comment)   walking   Frequency  Add 2 additional days to program exercise sessions.    Initial Home Exercises Provided  06/14/19       Functional Capacity: 6 Minute Walk    Row Name 05/28/19 1314 08/17/19 1108       6 Minute Walk   Phase  Initial  Discharge    Distance  1532 feet  1835 feet    Distance % Change  -  19.8 %    Distance Feet Change  -  303 ft    Walk Time  6 minutes  6 minutes    # of Rest Breaks  0  0    MPH  2.9  3.47    METS  2.7  3.75    RPE  7  15    Perceived Dyspnea   0  -    VO2 Peak  9.35  13.12    Symptoms  No  No    Resting HR  58 bpm  65 bpm    Resting BP  108/70  120/68    Resting Oxygen Saturation   98 %  -    Exercise Oxygen Saturation  during 6 min walk  94 %  -    Max Ex. HR  92 bpm  105 bpm    Max Ex. BP  132/66  178/86    2 Minute Post BP  114/70  -       Psychological, QOL, Others - Outcomes: PHQ  2/9: Depression screen [facility] 2/9 08/24/2019 06/28/2019 05/28/2019  Decreased Interest 0 0 2  Down, Depressed, Hopeless 0 0 1  PHQ - 2 Score 0 0 3  Altered sleeping 0 0 1  Tired, decreased energy 0 1 3  Change in appetite 0 0 0  Feeling bad or failure about yourself  0 0 0  Trouble concentrating 1 0 1  Moving slowly or fidgety/restless 0 0 0  Suicidal thoughts 0 0 0  PHQ-9 Score '1 1 8  ' Difficult doing work/chores Not difficult at all Not difficult at all Somewhat difficult    Quality of Life: Quality of Life - 08/27/19 1646      Quality of Life   Select  Quality of Life      Quality of Life Scores   Health/Function Pre  20 %    Health/Function Post  23.2 %    Health/Function % Change  16 %    Socioeconomic Pre  24.31 %    Socioeconomic Post  25.68 %    Socioeconomic % Change   5.64 %    Psych/Spiritual Pre  23.14 %    Psych/Spiritual Post  27.42 %    Psych/Spiritual % Change  18.5 %    Family Pre  23.4 %    Family Post  24.6 %    Family % Change  5.13 %    GLOBAL Pre  22.1 %    GLOBAL Post  24.81 %    GLOBAL % Change  12.26 %       Personal Goals: Goals established at orientation with interventions provided to work toward goal. Personal Goals and Risk Factors at Admission - 05/28/19 1324      Core Components/Risk Factors/Patient Goals on Admission    Weight Management  Yes    Admit Weight  204 lb (92.5 kg)    Hypertension  Yes    Intervention  Provide education on lifestyle modifcations including regular physical activity/exercise, weight management, moderate sodium restriction and increased consumption of fresh fruit, vegetables, and low fat dairy, alcohol moderation, and smoking cessation.;Monitor prescription use compliance.    Expected Outcomes  Short Term: Continued assessment and intervention until BP is < 140/59m HG in hypertensive participants. < 130/856mHG in hypertensive participants with diabetes, heart failure or chronic kidney disease.;Long Term: Maintenance  of blood pressure at goal levels.    Lipids  Yes    Intervention  Provide education and support for participant on nutrition & aerobic/resistive exercise along with prescribed medications to achieve LDL <702mHDL >69m6m  Expected Outcomes  Short Term: Participant states understanding of desired cholesterol values and is compliant with medications prescribed. Participant is following exercise prescription and nutrition guidelines.;Long Term: Cholesterol controlled with medications as prescribed, with individualized exercise RX and with personalized nutrition plan. Value goals: LDL < 70mg35mL > 40 mg.        Personal Goals Discharge: Goals and Risk Factor Review    Row Name 06/14/19 1121 07/06/19 1058 08/10/19 1054         Core Components/Risk Factors/Patient Goals Review   Personal Goals Review  Weight Management/Obesity;Hypertension;Lipids  Weight Management/Obesity;Hypertension;Lipids  Weight Management/Obesity;Improve shortness of breath with ADL's;Lipids     Review  Tom's weight has been steady. He is starting to lose now thanks to some diet tweaks.  His blood pressures have improved and he is feeling better overall.  He checks it every morning at home.  He has not noted any problems with his medications.  Tom wants to lose some weight and be as healthy as he can be. His blood pressure has been up and down lately. His blood pressure has been up and down since some of his medications changes. He is checking his blood pressure at home. He is taking a statin to help with his lipds. He will be on Lipitor for a year.  Toms weight is 197 pounds. Her is watching his eating and wants to reduce his weight to 190 pounds. His lipids were 160 last time he had them checked. He is able to do more at home now and does not get as tired when doing ADL. 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 Tom sGershon Musseles that his overall health feels better and that the program has helped him.      Expected Outcomes  Short: Continue to work on weight. Long: Continue to monitor risk factors.  Short: lose 5 pounds in the next two weeks. Long: Maintain weight loss post [facility] independently.  Short: Exercise and take medications as prescribed to help with overall health. Long: maintain exercise and medications independently to improve upon  overall health.        Exercise Goals and Review: Exercise Goals    Row Name 05/28/19 1323             Exercise Goals   Increase Physical Activity  Yes       Expected Outcomes  Short Term: Attend rehab on a regular basis to increase amount of physical activity.;Long Term: Add in home exercise to make exercise part of routine and to increase amount of physical activity.;Long Term: Exercising regularly at least 3-5 days a week.       Increase Strength and Stamina  Yes       Intervention  Provide advice, education, support and counseling about physical activity/exercise needs.;Develop an individualized exercise prescription for aerobic and resistive training based on initial evaluation findings, risk stratification, comorbidities and participant's personal goals.       Expected Outcomes  Short Term: Increase workloads from initial exercise prescription for resistance, speed, and METs.;Short Term: Perform resistance training exercises routinely during rehab and add in resistance training at home;Long Term: Improve cardiorespiratory fitness, muscular endurance and strength as measured by increased METs and functional capacity (6MWT)       Able to understand and use rate of perceived exertion (RPE) scale  Yes       Intervention  Provide education and explanation on how to use RPE scale       Expected Outcomes  Short Term: Able to use RPE daily in rehab to express subjective intensity level;Long Term:  Able to use RPE to guide intensity level when exercising independently       Knowledge and understanding of Target Heart Rate Range (THRR)  Yes        Intervention  Provide education and explanation of THRR including how the numbers were predicted and where they are located for reference       Expected Outcomes  Short Term: Able to state/look up THRR;Short Term: Able to use daily as guideline for intensity in rehab;Long Term: Able to use THRR to govern intensity when exercising independently       Able to check pulse independently  Yes       Intervention  Provide education and demonstration on how to check pulse in carotid and radial arteries.;Review the importance of being able to check your own pulse for safety during independent exercise       Expected Outcomes  Short Term: Able to explain why pulse checking is important during independent exercise;Long Term: Able to check pulse independently and accurately       Understanding of Exercise Prescription  Yes       Intervention  Provide education, explanation, and written materials on patient's individual exercise prescription       Expected Outcomes  Short Term: Able to explain program exercise prescription;Long Term: Able to explain home exercise prescription to exercise independently          Exercise Goals Re-Evaluation: Exercise Goals Re-Evaluation    Row Name 05/29/19 1054 06/08/19 1006 06/14/19 1118 06/27/19 1322 07/10/19 1603     Exercise Goal Re-Evaluation   Exercise Goals Review  Increase Physical Activity;Increase Strength and Stamina;Able to understand and use rate of perceived exertion (RPE) scale;Able to understand and use Dyspnea scale;Knowledge and understanding of Target Heart Rate Range (THRR);Understanding of Exercise Prescription  Increase Physical Activity;Increase Strength and Stamina;Able to understand and use rate of perceived exertion (RPE) scale;Knowledge and understanding of Target Heart Rate Range (THRR);Able to check pulse independently;Understanding of Exercise Prescription  Increase Physical Activity;Increase Strength and Stamina;Understanding of Exercise Prescription   Increase Physical Activity;Increase Strength and Stamina;Able to understand and use rate of perceived exertion (RPE) scale;Knowledge and understanding of Target Heart Rate Range (THRR);Able to check pulse independently;Understanding of Exercise Prescription  Increase Physical Activity;Increase Strength and Stamina;Understanding of Exercise Prescription   Comments  Reviewed RPE scale, THR and program prescription with pt today.  Pt voiced understanding and was given a copy of goals to take home.  Gershon Mussel has done very well so far.  The elliptical is most challengin for him - her reproted being very tired after the first session he did elliptical.  He was taken off of one of his BP meds and felt much more energy since then.  He is cleared by his Dr to use 10 lb weights.  Gershon Mussel is doing well in rehab.  He is enjoying come to class and getting back into exercise again.  He used to run and bike and now walks.  He used to be up to a 1-2 miles a day.Reviewed home exercise with pt today.  Pt plans to walking and using weights at home for exercise.  He also belongs  [employer] once it reopens. Reviewed THR, pulse, RPE, sign and symptoms, NTG use, and when to call 911 or MD.  Also discussed weather considerations and indoor options.  Pt voiced understanding.  Gershon Mussel is progressing well and has increased MET level.  he uses 10 lb weights for strength work.Gershon Mussel is doing well in rehab.  He is already up to 76 watts on the recumbent bike and level 9 on the BioStep.  We will continue to monitor his progress.   Expected Outcomes  Short: Use RPE daily to regulate intensity. Long: Follow program prescription in THR.  Short - continue to attend consistently Long - increase overall MET level  Short: Start to add home exercise back in.  Long: Continue to increase stamina.  Short - continue to progress workloads Long - increase overall MET level  Short: Continue to push on treadmill.  Long: Continue to exericse on off days.   [place] Name  07/31/19 1028 08/09/19 1610 08/10/19 1045 08/22/19 1315       Exercise Goal Re-Evaluation   Exercise Goals Review  Increase Physical Activity;Increase Strength and Stamina;Understanding of Exercise Prescription  Increase Physical Activity;Increase Strength and Stamina;Able to understand and use rate of perceived exertion (RPE) scale;Knowledge and understanding of Target Heart Rate Range (THRR);Able to check pulse independently;Understanding of Exercise Prescription  Increase Physical Activity;Increase Strength and Stamina  Increase Physical Activity;Increase Strength and Stamina    Comments  Gershon Mussel continues to do well in rehab.  He was doing 3.9 mph on the ellipitcal!!  He is also up to 68 watts on the recumbent bike.  We will continue to monitor his progress.  Gershon Mussel has improved overall MET level.  He is on level 11 on the elliptical and does up to 10% grade on TM.  Tom and his wife walk around his block for exercise. He does push  ups and tries to stay active when he is not in rehab. He wants to go back to [employer] or the [facility] [employer] when he is done with the program.  Gershon Mussel is nearing graduation.  He improved his post 6MWT by over 300 ft!  He is planning to join a gym to continue to exercise.    Expected Outcomes  Short: Continue to increase workloads.  Long: Continue to improve  stamina.  Short - continue to attend consistently Long : maintain fitness gains  Short: continue to exercise regularly. Long: joing [employer] or the [employer].  Short: Graduate!!  Long: Continue to exercise independently.       Nutrition & Weight - Outcomes: Pre Biometrics - 05/28/19 1323      Pre Biometrics   Height  5' 8.25" (1.734 m)    Weight  204 lb (92.5 kg)    BMI (Calculated)  30.78    Single Leg Stand  2.4 seconds        Nutrition: Nutrition Therapy & Goals - 05/28/19 1200      Nutrition Therapy   Diet  low Na, HH diet    Protein (specify units)  75g    Fiber  30 grams    Whole Grain Foods  3  servings    Saturated Fats  12 max. grams    Fruits and Vegetables  5 servings/day    Sodium  1.5 grams      Personal Nutrition Goals   Nutrition Goal  ST: have at least 2 servings of Pro per day and sneak in some more (like soymilk) LT: walk better and get stronger.    Comments  Spoke with pt and wife. B pt has cereal with almond milk and coffee (creamer/splenda). L salad with veggies olive oil and vinegar and some chickpeas (a couple of tbsp) or some peanut butter and fruit or tomato sandwich on homemade bread and dukes mayo or fruit and cottage cheese. D: chicken sald or tuna sald or buger or rice and beans; usually just steamed vegetables and mashed potatoes (smart balance with olive oil). Pt reports using canola oil in baking. Pt reports not liking meat too much, discussed other ways to eat consistent protein like switching to soymilk or adding more chickpeas to salad, or when having yogurt and fruit to have greek yogurt which is higher in protein. Pt has been watching Na, chosing low salt options. Discussed HH eating.      Intervention Plan   Intervention  Prescribe, educate and counsel regarding individualized specific dietary modifications aiming towards targeted core components such as weight, hypertension, lipid management, diabetes, heart failure and other comorbidities.;Nutrition handout(s) given to patient.    Expected Outcomes  Short Term Goal: Understand basic principles of dietary content, such as calories, fat, sodium, cholesterol and nutrients.;Short Term Goal: A plan has been developed with personal nutrition goals set during dietitian appointment.;Long Term Goal: Adherence to prescribed nutrition plan.       Nutrition Discharge: Nutrition Assessments - 08/27/19 1646      MEDFICTS Scores   Pre Score  20    Post Score  15    Score Difference  -5       Education Questionnaire Score: Knowledge Questionnaire Score - 08/27/19 1646      Knowledge Questionnaire Score   Pre  Score  26/26    Post Score  26/26       Goals reviewed with patient; copy given to patient.

## 2019-09-24 ENCOUNTER — Other Ambulatory Visit: Payer: Self-pay | Admitting: Physician Assistant

## 2019-09-24 DIAGNOSIS — R339 Retention of urine, unspecified: Secondary | ICD-10-CM

## 2019-12-22 ENCOUNTER — Other Ambulatory Visit: Payer: Self-pay | Admitting: Physician Assistant

## 2019-12-22 DIAGNOSIS — R339 Retention of urine, unspecified: Secondary | ICD-10-CM

## 2019-12-24 ENCOUNTER — Telehealth: Payer: Self-pay | Admitting: Physician Assistant

## 2019-12-24 NOTE — Telephone Encounter (Signed)
Please contact the patient and inform him that I have refilled his Flomax for another 3 months.  Please schedule him for follow-up in clinic in 3 months for PVR and symptom recheck with me or Dr. Erlene Quan.

## 2019-12-24 NOTE — Telephone Encounter (Signed)
Called pt informed him of the information below. Pt gave verbal understanding. Pt scheduled for 03/25/2020.

## 2020-03-25 ENCOUNTER — Other Ambulatory Visit: Payer: Self-pay

## 2020-03-25 ENCOUNTER — Ambulatory Visit: Payer: Medicare PPO | Admitting: Physician Assistant

## 2020-03-25 ENCOUNTER — Encounter: Payer: Self-pay | Admitting: Physician Assistant

## 2020-03-25 VITALS — BP 145/85 | HR 59 | Ht 72.0 in | Wt 194.0 lb

## 2020-03-25 DIAGNOSIS — N3001 Acute cystitis with hematuria: Secondary | ICD-10-CM

## 2020-03-25 DIAGNOSIS — R339 Retention of urine, unspecified: Secondary | ICD-10-CM

## 2020-03-25 LAB — MICROSCOPIC EXAMINATION

## 2020-03-25 LAB — BLADDER SCAN AMB NON-IMAGING: Scan Result: 261 mL

## 2020-03-25 MED ORDER — TAMSULOSIN HCL 0.4 MG PO CAPS: 0.4000 mg | ORAL_CAPSULE | Freq: Every day | ORAL | 11 refills | Status: DC

## 2020-03-25 MED ORDER — SULFAMETHOXAZOLE-TRIMETHOPRIM 800-160 MG PO TABS: 1.0000 | ORAL_TABLET | Freq: Two times a day (BID) | ORAL | 0 refills | Status: AC

## 2020-03-25 NOTE — Progress Notes (Signed)
03/25/2020 2:53 PM   Marvin Jefferson [birth year] [id number]  CC: Chief Complaint  Patient presents with  . Urinary Retention    HPI: Marvin Jefferson is a 79 y.o. 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  I saw him most recently in clinic on 07/04/2019 for evaluation of acute onset difficulty urinating following discontinuation of Terazosin due to hypotension.  Patient was instructed to resume CIC as needed at that time and start tamsulosin as an alternative.  Today, patient reports feeling well with no acute concerns.  He denies dysuria, urgency, frequency, and gross hematuria.  He is no longer CICing as he is not experiencing bladder pain associated with retention.  Of note, he reports having completed labs with his PCP last week notable for microscopic hematuria.  On chart review, he was noted to have 4-10 RBCs/hpf on microscopy at that time; UA also notable for nitrites, 4-10 WBCs/hpf, and many bacteria. Labs also notable for creatinine 1.0.  In-office UA today positive for trace-intact blood, nitrites, and 1+ leukocyte esterase; urine microscopy with 11-30 WBCs/HPF and many bacteria. PVR 214mL.  PMH: Past Medical History:  Diagnosis Date  . Anxiety   . Chronic back pain   . DDD (degenerative disc disease), cervical   . DDD (degenerative disc disease), lumbar   . Dementia ([place])   . Depression   . History of dysplastic nevus   . Hypertension   . Meniere's disease   . Pneumonia 2015  . Umbilical hernia   . UTI (urinary tract infection)     Surgical History: Past Surgical History:  Procedure Laterality Date  . APPENDECTOMY    . COLONOSCOPY WITH PROPOFOL N/A 05/23/2017   Procedure: COLONOSCOPY WITH PROPOFOL;  Surgeon: Lollie Sails, MD;  Location: [facility] ENDOSCOPY;   Service: Endoscopy;  Laterality: N/A;  . CORONARY/GRAFT ACUTE MI REVASCULARIZATION N/A 04/19/2019   Procedure: Coronary/Graft Acute MI Revascularization;  Surgeon: Isaias Cowman, MD;  Location: [place] CV LAB;  Service: Cardiovascular;  Laterality: N/A;  . ESOPHAGOGASTRODUODENOSCOPY (EGD) WITH PROPOFOL N/A 05/23/2017   Procedure: ESOPHAGOGASTRODUODENOSCOPY (EGD) WITH PROPOFOL;  Surgeon: Lollie Sails, MD;  Location: [facility] ENDOSCOPY;  Service: Endoscopy;  Laterality: N/A;  . GANGLION CYST EXCISION Right    wrist  . HERNIA REPAIR     umbilical hernia  . 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 N/A 08/23/2016   Procedure: 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;  Surgeon: Hollice Espy, MD;  Location: [facility] ORS;  Service: Urology;  Laterality: N/A;  . 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 N/A 12/06/2016   Procedure: 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;  Surgeon: Hollice Espy, MD;  Location: [facility] ORS;  Service: Urology;  Laterality: N/A;  . LEFT HEART CATH AND CORONARY ANGIOGRAPHY N/A 04/19/2019   Procedure: LEFT HEART CATH AND CORONARY ANGIOGRAPHY;  Surgeon: Isaias Cowman, MD;  Location: [place] CV LAB;  Service: Cardiovascular;  Laterality: N/A;    Home Medications:  Allergies as of 03/25/2020      Reactions   Clarithromycin Other (See Comments)   Terrible taste and smell      Medication List       Accurate as of March 25, 2020  2:53 PM. If you have any questions, ask your nurse or doctor.  STOP taking these medications   terazosin 5 MG capsule Commonly known as: HYTRIN Stopped by: Debroah Loop, PA-C     TAKE these medications   acetaminophen 500 MG tablet Commonly known as: TYLENOL Take 1,000 mg by mouth every 6 (six) hours as needed for mild pain.   ALPRAZolam 0.25 MG tablet Commonly known as: XANAX Take 0.25 mg by mouth 2 (two) times daily as needed  (dizziness).   amLODipine 5 MG tablet Commonly known as: NORVASC Take by mouth.   aspirin 81 MG chewable tablet Chew 1 tablet (81 mg total) by mouth daily.   atorvastatin 80 MG tablet Commonly known as: LIPITOR Take 1 tablet (80 mg total) by mouth daily at 6 PM.   clopidogrel 75 MG tablet Commonly known as: PLAVIX Take 75 mg by mouth daily.   docusate sodium 100 MG capsule Commonly known as: COLACE Take 100 mg by mouth daily.   FLUoxetine 20 MG capsule Commonly known as: PROZAC Take 20 mg by mouth daily.   fluticasone 50 MCG/ACT nasal spray Commonly known as: FLONASE USE 2 SPRAYS IN EACH NOSTRIL EVERY DAY AS NEEDED FOR ALLERGIES   gabapentin 100 MG capsule Commonly known as: NEURONTIN Take 100 mg by mouth 3 (three) times daily.   galantamine 12 MG tablet Commonly known as: RAZADYNE Take 12 mg by mouth 2 (two) times daily.   hydrocortisone cream 1 % Apply 1 application topically daily as needed for itching.   lactulose 10 GM/15ML solution Commonly known as: CHRONULAC Take 30 mLs (20 g total) by mouth daily as needed for mild constipation.   lisinopril 2.5 MG tablet Commonly known as: ZESTRIL Take 1 tablet (2.5 mg total) by mouth daily.   meclizine 25 MG tablet Commonly known as: ANTIVERT Take 25 mg by mouth 3 (three) times daily as needed for dizziness.   melatonin 5 MG Tabs Take 1 tablet (5 mg total) by mouth at bedtime.   memantine 5 MG tablet Commonly known as: NAMENDA Take 5 mg by mouth 2 (two) times a day.   methocarbamol 500 MG tablet Commonly known as: ROBAXIN Take 500 mg by mouth 3 (three) times daily as needed for muscle spasms.   methylphenidate 5 MG tablet Commonly known as: RITALIN Take 1 tablet by mouth 2 (two) times daily.   metoprolol tartrate 25 MG tablet Commonly known as: LOPRESSOR Take by mouth.   multivitamin with minerals Tabs tablet Take 1 tablet by mouth daily.   neomycin-bacitracin-polymyxin ointment Commonly known as:  NEOSPORIN Apply 1 application topically daily as needed for wound care. apply to eye   nystatin cream Commonly known as: MYCOSTATIN Apply 1 application topically 2 (two) times daily.   OVER THE COUNTER MEDICATION Place 0.75 mLs under the tongue at bedtime.   PEG 3350 17 GM/SCOOP Powd Take 17 g by mouth daily.   rivastigmine 9.5 mg/24hr Commonly known as: EXELON Place 1 patch onto the skin daily.   sodium chloride 0.65 % Soln nasal spray Commonly known as: OCEAN Place 2 sprays into both nostrils 2 (two) times daily as needed for congestion.   sulfamethoxazole-trimethoprim 800-160 MG tablet Commonly known as: BACTRIM DS Take 1 tablet by mouth 2 (two) times daily for 7 days. Started by: Debroah Loop, PA-C   tamsulosin 0.4 MG Caps capsule Commonly known as: FLOMAX Take 1 capsule (0.4 mg total) by mouth daily.   traMADol 50 MG tablet Commonly known as: ULTRAM Take 50 mg by mouth every 6 (six) hours as needed for moderate  pain.   Vitamin D3 50 MCG (2000 UT) capsule Take 2,000 Units by mouth daily.   vitamin E 1000 UNIT capsule Take 1,000 Units by mouth daily.       Allergies:  Allergies  Allergen Reactions  . Clarithromycin Other (See Comments)    Terrible taste and smell    Family History: Family History  Problem Relation Age of Onset  . Prostate cancer Father   . Hypertension Father   . Stroke Father   . Hypertension Mother   . Dementia Mother   . Bladder Cancer Neg Hx   . Kidney cancer Neg Hx     Social History:   reports that he quit smoking about 57 years ago. He has never used smokeless tobacco. He reports that he does not drink alcohol or use drugs.  Physical Exam: BP (!) 145/85   Pulse (!) 59   Ht 6' (1.829 m)   Wt 194 lb (88 kg)   BMI 26.31 kg/m   Constitutional:  Alert and oriented, no acute distress, nontoxic appearing HEENT: [place], AT Cardiovascular: No clubbing, cyanosis, or edema Respiratory: Normal respiratory effort, no  increased work of breathing Skin: No rashes, bruises or suspicious lesions Neurologic: Grossly intact, no focal deficits, moving all 4 extremities Psychiatric: Normal mood and affect  Laboratory Data: Results for orders placed or performed in visit on 03/25/20  Microscopic Examination   URINE  Result Value Ref Range   WBC, UA 11-30 (A) 0 - 5 /hpf   RBC 0-2 0 - 2 /hpf   Epithelial Cells (non renal) 0-10 0 - 10 /hpf   Crystals Present (A) N/A   Crystal Type Amorphous Sediment N/A   Bacteria, UA Many (A) None seen/Few  BLADDER SCAN AMB NON-IMAGING  Result Value Ref Range   Scan Result 261 mL   Assessment & Plan:   1. Incomplete bladder emptying PVR elevated today, however this appears stable given that he has remained asymptomatic since restarting Flomax 9 months ago.  Recent creatinine reassuring for upper tract involvement.  We will plan to watch conservatively and continue Flomax.  Patient is in agreement with this plan. - BLADDER SCAN AMB NON-IMAGING - tamsulosin (FLOMAX) 0.4 MG CAPS capsule; Take 1 capsule (0.4 mg total) by mouth daily.  Dispense: 30 capsule; Refill: 11  2. Acute cystitis with hematuria MH noted on UA last week, resolved on UA today. Patient denies irritative voiding symptoms; suspect colonization. Will treat empirically and recheck urine in one week to ensure stable resolution in [place]. If MH persists, recommend hematuria workup. If it resolves, no further intervention indicated. - Urinalysis, Complete - CULTURE, URINE COMPREHENSIVE - sulfamethoxazole-trimethoprim (BACTRIM DS) 800-160 MG tablet; Take 1 tablet by mouth 2 (two) times daily for 7 days.  Dispense: 14 tablet; Refill: 0 - Urinalysis, Complete; Future   Return in about 1 week (around 04/01/2020) for Lab visit for UA, 1 year symptom recheck with PVR with Dr. Erlene Quan.  Debroah Loop, PA-C  [facility] [address], [place], [place] [address] [phone]

## 2020-03-30 LAB — CULTURE, URINE COMPREHENSIVE

## 2020-03-31 ENCOUNTER — Other Ambulatory Visit: Payer: Self-pay

## 2020-03-31 DIAGNOSIS — R339 Retention of urine, unspecified: Secondary | ICD-10-CM

## 2020-04-01 ENCOUNTER — Other Ambulatory Visit: Payer: Self-pay

## 2020-04-01 ENCOUNTER — Other Ambulatory Visit: Payer: Medicare PPO

## 2020-04-01 DIAGNOSIS — R339 Retention of urine, unspecified: Secondary | ICD-10-CM

## 2020-04-01 LAB — MICROSCOPIC EXAMINATION
Bacteria, UA: NONE SEEN
RBC, Urine: NONE SEEN /hpf (ref 0–2)
WBC, UA: NONE SEEN /hpf (ref 0–5)

## 2020-04-01 LAB — URINALYSIS, COMPLETE
Bilirubin, UA: NEGATIVE
Glucose, UA: NEGATIVE
Ketones, UA: NEGATIVE
Leukocytes,UA: NEGATIVE
Nitrite, UA: NEGATIVE
Protein,UA: NEGATIVE
Specific Gravity, UA: 1.015 (ref 1.005–1.030)
Urobilinogen, Ur: 0.2 mg/dL (ref 0.2–1.0)
pH, UA: 6.5 (ref 5.0–7.5)

## 2020-05-19 ENCOUNTER — Encounter: Payer: Self-pay | Admitting: Dermatology

## 2020-05-19 ENCOUNTER — Other Ambulatory Visit: Payer: Self-pay

## 2020-05-19 ENCOUNTER — Ambulatory Visit: Payer: Medicare PPO | Admitting: Dermatology

## 2020-05-19 DIAGNOSIS — L570 Actinic keratosis: Secondary | ICD-10-CM | POA: Diagnosis not present

## 2020-05-19 DIAGNOSIS — L821 Other seborrheic keratosis: Secondary | ICD-10-CM

## 2020-05-19 DIAGNOSIS — L814 Other melanin hyperpigmentation: Secondary | ICD-10-CM

## 2020-05-19 DIAGNOSIS — D229 Melanocytic nevi, unspecified: Secondary | ICD-10-CM | POA: Diagnosis not present

## 2020-05-19 DIAGNOSIS — L578 Other skin changes due to chronic exposure to nonionizing radiation: Secondary | ICD-10-CM

## 2020-05-19 DIAGNOSIS — Z1283 Encounter for screening for malignant neoplasm of skin: Secondary | ICD-10-CM | POA: Diagnosis not present

## 2020-05-19 DIAGNOSIS — D1800 Hemangioma unspecified site: Secondary | ICD-10-CM

## 2020-05-19 NOTE — Progress Notes (Signed)
   Follow-Up Visit   Subjective  Marvin Jefferson is a 79 y.o. male who presents for the following: Annual Exam (History of dysplastic nevus - no new or changing moles, lesions, or spots the the paitent has noticed). The patient presents for Total-Body Skin Exam (TBSE) for skin cancer screening and mole check.  The following portions of the chart were reviewed this encounter and updated as appropriate:  Tobacco  Allergies  Meds  Problems  Med Hx  Surg Hx  Fam Hx     Review of Systems:  No other skin or systemic complaints except as noted in HPI or Assessment and Plan.  Objective  Well appearing patient in no apparent distress; mood and affect are within normal limits.  A full examination was performed including scalp, head, eyes, ears, nose, lips, neck, chest, axillae, abdomen, back, buttocks, bilateral upper extremities, bilateral lower extremities, hands, feet, fingers, toes, fingernails, and toenails. All findings within normal limits unless otherwise noted below.  Objective  Left Lower [place] Lip : Erythematous thin papules/macules with gritty scale.   Assessment & Plan  AK (actinic keratosis) Left Lower [place] Lip   Destruction of lesion - Left [place] Lip  Complexity: simple   Destruction method: cryotherapy   Informed consent: discussed and consent obtained   Timeout:  patient name, date of birth, surgical site, and procedure verified Lesion destroyed using liquid nitrogen: Yes   Region frozen until ice ball extended beyond lesion: Yes   Outcome: patient tolerated procedure well with no complications   Post-procedure details: wound care instructions given    Skin cancer screening   Lentigines - Scattered tan macules - Discussed due to sun exposure - Benign, observe - Call for any changes  Seborrheic Keratoses - Stuck-on, waxy, tan-brown papules and plaques  - Discussed benign etiology and prognosis. - Observe - Call for any  changes  Melanocytic Nevi - Tan-brown and/or pink-flesh-colored symmetric macules and papules - Benign appearing on exam today - Observation - Call clinic for new or changing moles - Recommend daily use of broad spectrum spf 30+ sunscreen to sun-exposed areas.   Hemangiomas - Red papules - Discussed benign nature - Observe - Call for any changes  Actinic Damage - diffuse scaly erythematous macules with underlying dyspigmentation - Recommend daily broad spectrum sunscreen SPF 30+ to sun-exposed areas, reapply every 2 hours as needed.  - Call for new or changing lesions.  Skin cancer screening performed today.  Return in about 1 year (around 05/19/2021) for TBSE.  Luther Redo, CMA, am acting as scribe for Sarina Ser, MD .  Documentation: I have reviewed the above documentation for accuracy and completeness, and I agree with the above.  Sarina Ser, MD

## 2020-05-19 NOTE — Progress Notes (Deleted)
   Follow-Up Visit   Subjective  Marvin Jefferson is a 79 y.o. male who presents for the following: Annual Exam (History of dysplastic nevus - no new or changing moles, lesions, or spots the the paitent has noticed).  The following portions of the chart were reviewed this encounter and updated as appropriate:     Review of Systems:  No other skin or systemic complaints except as noted in HPI or Assessment and Plan.  Objective  Well appearing patient in no apparent distress; mood and affect are within normal limits.  A full examination was performed including scalp, head, eyes, ears, nose, lips, neck, chest, axillae, abdomen, back, buttocks, bilateral upper extremities, bilateral lower extremities, hands, feet, fingers, toes, fingernails, and toenails. All findings within normal limits unless otherwise noted below.  Objective  Left Lower [place] Lip : Erythematous thin papules/macules with gritty scale.   Assessment & Plan  AK (actinic keratosis) Left Lower Vermilion Lip   Destruction of lesion - Left Lower [place] Lip  Complexity: simple   Destruction method: cryotherapy   Informed consent: discussed and consent obtained   Timeout:  patient name, date of birth, surgical site, and procedure verified Lesion destroyed using liquid nitrogen: Yes   Region frozen until ice ball extended beyond lesion: Yes   Outcome: patient tolerated procedure well with no complications   Post-procedure details: wound care instructions given     Lentigines - Scattered tan macules - Discussed due to sun exposure - Benign, observe - Call for any changes  Seborrheic Keratoses - Stuck-on, waxy, tan-brown papules and plaques  - Discussed benign etiology and prognosis. - Observe - Call for any changes  Melanocytic Nevi - Tan-brown and/or pink-flesh-colored symmetric macules and papules - Benign appearing on exam today - Observation - Call clinic for new or changing moles - Recommend daily use  of broad spectrum spf 30+ sunscreen to sun-exposed areas.   Hemangiomas - Red papules - Discussed benign nature - Observe - Call for any changes  Actinic Damage - diffuse scaly erythematous macules with underlying dyspigmentation - Recommend daily broad spectrum sunscreen SPF 30+ to sun-exposed areas, reapply every 2 hours as needed.  - Call for new or changing lesions.  Skin cancer screening performed today.  Return in about 1 year (around 05/19/2021) for TBSE.  Luther Redo, CMA, am acting as scribe for Sarina Ser, MD .

## 2020-05-20 ENCOUNTER — Encounter: Payer: Self-pay | Admitting: Dermatology

## 2020-09-30 ENCOUNTER — Other Ambulatory Visit: Payer: Self-pay | Admitting: Internal Medicine

## 2020-09-30 ENCOUNTER — Other Ambulatory Visit (HOSPITAL_COMMUNITY): Payer: Self-pay | Admitting: Internal Medicine

## 2020-09-30 DIAGNOSIS — I712 Thoracic aortic aneurysm, without rupture, unspecified: Secondary | ICD-10-CM

## 2020-10-01 ENCOUNTER — Other Ambulatory Visit: Payer: Self-pay | Admitting: Internal Medicine

## 2020-10-01 DIAGNOSIS — I2510 Atherosclerotic heart disease of native coronary artery without angina pectoris: Secondary | ICD-10-CM

## 2020-10-01 DIAGNOSIS — I712 Thoracic aortic aneurysm, without rupture, unspecified: Secondary | ICD-10-CM

## 2020-10-15 ENCOUNTER — Other Ambulatory Visit: Payer: Self-pay

## 2020-10-15 ENCOUNTER — Ambulatory Visit
Admission: RE | Admit: 2020-10-15 | Discharge: 2020-10-15 | Disposition: A | Discharge status: Home / Self Care | Payer: Medicare PPO | Source: Ambulatory Visit | Attending: Internal Medicine | Admitting: Internal Medicine

## 2020-10-15 DIAGNOSIS — I712 Thoracic aortic aneurysm, without rupture, unspecified: Secondary | ICD-10-CM

## 2020-10-15 DIAGNOSIS — I2510 Atherosclerotic heart disease of native coronary artery without angina pectoris: Secondary | ICD-10-CM | POA: Diagnosis present

## 2021-03-14 ENCOUNTER — Other Ambulatory Visit: Payer: Self-pay | Admitting: Physician Assistant

## 2021-03-14 DIAGNOSIS — R339 Retention of urine, unspecified: Secondary | ICD-10-CM

## 2021-03-25 ENCOUNTER — Ambulatory Visit (INDEPENDENT_AMBULATORY_CARE_PROVIDER_SITE_OTHER): Payer: Medicare PPO | Admitting: Urology

## 2021-03-25 ENCOUNTER — Other Ambulatory Visit: Payer: Self-pay

## 2021-03-25 VITALS — BP 126/84 | HR 73 | Ht 68.0 in | Wt 193.0 lb

## 2021-03-25 DIAGNOSIS — R339 Retention of urine, unspecified: Secondary | ICD-10-CM | POA: Diagnosis not present

## 2021-03-25 LAB — BLADDER SCAN AMB NON-IMAGING: Scan Result: 262

## 2021-03-25 NOTE — Progress Notes (Signed)
03/25/2021 8:52 AM   Marvin Jefferson [birth year] [id number]  Referring provider: Rusty Aus, MD [facility] [place],  [place] [address]  Chief Complaint  Patient presents with   Benign Prostatic Hypertrophy    HPI: 80 year old male who returns today for routine annual follow-up.  He has a personal history of BPH test was holep in 2017.  He continues to need to self cath and initially on the procedure but with fairly residuals, has ultimately weaned himself off of this.  He still has the equipment and knowledge to do so if he feels like he is not emptying completely.  He has not do this in quite some time.  He was treated for UTI back in 03/2020 but not had any issues since.  Overall, he is very pleased with his urinary symptoms.     Results for orders placed or performed in visit on 03/25/21  BLADDER SCAN AMB NON-IMAGING  Result Value Ref Range   Scan Result 262 ml      PMH: Past Medical History:  Diagnosis Date   Anxiety    Chronic back pain    DDD (degenerative disc disease), cervical    DDD (degenerative disc disease), lumbar    Dementia ([place])    Depression    History of dysplastic nevus 12/23/2009   Right upper back 4.0 cm lat to spine   Hypertension    Meniere's disease    Pneumonia 4034   Umbilical hernia    UTI (urinary tract infection)     Surgical History: Past Surgical History:  Procedure Laterality Date   APPENDECTOMY     COLONOSCOPY WITH PROPOFOL N/A 05/23/2017   Procedure: COLONOSCOPY WITH PROPOFOL;  Surgeon: Lollie Sails, MD;  Location: [facility] ENDOSCOPY;  Service: Endoscopy;  Laterality: N/A;   CORONARY/GRAFT ACUTE MI REVASCULARIZATION N/A 04/19/2019   Procedure: Coronary/Graft Acute MI Revascularization;  Surgeon: Isaias Cowman, MD;  Location: [place] CV LAB;  Service: Cardiovascular;  Laterality: N/A;   ESOPHAGOGASTRODUODENOSCOPY (EGD) WITH PROPOFOL N/A 05/23/2017   Procedure:  ESOPHAGOGASTRODUODENOSCOPY (EGD) WITH PROPOFOL;  Surgeon: Lollie Sails, MD;  Location[facility] ENDOSCOPY;  Service: Endoscopy;  Laterality: N/A;   GANGLION CYST EXCISION Right    wrist   HERNIA REPAIR     umbilical hernia   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 N/A 08/23/2016   Procedure: 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;  Surgeon: Hollice Espy, MD;  Location: [facility] ORS;  Service: Urology;  Laterality: N/A;   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 N/A 12/06/2016   Procedure: 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;  Surgeon: Hollice Espy, MD;  Location: [facility] ORS;  Service: Urology;  Laterality: N/A;   LEFT HEART CATH AND CORONARY ANGIOGRAPHY N/A 04/19/2019   Procedure: LEFT HEART CATH AND CORONARY ANGIOGRAPHY;  Surgeon: Isaias Cowman, MD;  Location: [place] CV LAB;  Service: Cardiovascular;  Laterality: N/A;    Home Medications:  Allergies as of 03/25/2021       Reactions   Clarithromycin Other (See Comments)   Terrible taste and smell        Medication List        Accurate as of March 25, 2021 11:59 PM. If you have any questions, ask your nurse or doctor.          STOP taking these medications    docusate sodium 100 MG capsule Commonly known as: COLACE Stopped by: Hollice Espy, MD   lactulose  10 GM/15ML solution Commonly known as: [place] by: Hollice Espy, MD   lisinopril 2.5 MG tablet Commonly known as: ZESTRIL Stopped by: Hollice Espy, MD   methocarbamol 500 MG tablet Commonly known as: ROBAXIN Stopped by: Hollice Espy, MD   neomycin-bacitracin-polymyxin ointment Commonly known as: NEOSPORIN Stopped by: Hollice Espy, MD   rivastigmine 9.5 mg/24hr Commonly known as: EXELON Stopped by: Hollice Espy, MD       TAKE these medications    acetaminophen 500 MG tablet Commonly known as: TYLENOL Take 1,000 mg by mouth every 6 (six) hours as  needed for mild pain.   ALPRAZolam 0.25 MG tablet Commonly known as: XANAX Take 0.25 mg by mouth 2 (two) times daily as needed (dizziness).   amLODipine 5 MG tablet Commonly known as: NORVASC Take by mouth.   ARIPiprazole 2 MG tablet Commonly known as: ABILIFY Take by mouth.   aspirin 81 MG chewable tablet Chew 1 tablet (81 mg total) by mouth daily.   atorvastatin 80 MG tablet Commonly known as: LIPITOR Take 1 tablet (80 mg total) by mouth daily at 6 PM.   clopidogrel 75 MG tablet Commonly known as: PLAVIX Take 75 mg by mouth daily.   doxazosin 4 MG tablet Commonly known as: CARDURA Take 4 mg by mouth 2 (two) times daily.   ferrous sulfate 325 (65 FE) MG tablet 325 mg daily with breakfast   FLUoxetine 40 MG capsule Commonly known as: PROZAC Take 40 mg by mouth daily. What changed: Another medication with the same name was removed. Continue taking this medication, and follow the directions you see here. Changed by: Hollice Espy, MD   fluticasone 50 MCG/ACT nasal spray Commonly known as: FLONASE USE 2 SPRAYS IN EACH NOSTRIL EVERY DAY AS NEEDED FOR ALLERGIES   gabapentin 100 MG capsule Commonly known as: NEURONTIN Take 100 mg by mouth 3 (three) times daily.   galantamine 12 MG tablet Commonly known as: RAZADYNE Take 12 mg by mouth 2 (two) times daily.   hydrocortisone cream 1 % Apply 1 application topically daily as needed for itching.   meclizine 25 MG tablet Commonly known as: ANTIVERT Take 25 mg by mouth 3 (three) times daily as needed for dizziness.   melatonin 5 MG Tabs Take 1 tablet (5 mg total) by mouth at bedtime.   memantine 10 MG tablet Commonly known as: NAMENDA Take 10 mg by mouth 2 (two) times daily. What changed: Another medication with the same name was removed. Continue taking this medication, and follow the directions you see here. Changed by: Hollice Espy, MD   methylphenidate 5 MG tablet Commonly known as: RITALIN Take 1 tablet  by mouth 2 (two) times daily.   metoprolol tartrate 25 MG tablet Commonly known as: LOPRESSOR Take by mouth.   multivitamin with minerals Tabs tablet Take 1 tablet by mouth daily.   nystatin cream Commonly known as: MYCOSTATIN Apply 1 application topically 2 (two) times daily.   OVER THE COUNTER MEDICATION Place 0.75 mLs under the tongue at bedtime.   PEG 3350 17 GM/SCOOP Powd Take 17 g by mouth daily.   sodium chloride 0.65 % Soln nasal spray Commonly known as: OCEAN Place 2 sprays into both nostrils 2 (two) times daily as needed for congestion.   tamsulosin 0.4 MG Caps capsule Commonly known as: FLOMAX TAKE 1 CAPSULE BY MOUTH ONCE DAILY   traMADol 50 MG tablet Commonly known as: ULTRAM Take 50 mg by mouth every 6 (six) hours as needed for moderate pain.  triamterene-hydrochlorothiazide 37.5-25 MG capsule Commonly known as: DYAZIDE Take 1 capsule by mouth every morning.   vitamin B-12 500 MCG tablet Commonly known as: CYANOCOBALAMIN Take by mouth.   Vitamin D3 50 MCG (2000 UT) capsule Take 2,000 Units by mouth daily.   vitamin E 1000 UNIT capsule Take 1,000 Units by mouth daily.        Allergies:  Allergies  Allergen Reactions   Clarithromycin Other (See Comments)    Terrible taste and smell    Family History: Family History  Problem Relation Age of Onset   Prostate cancer Father    Hypertension Father    Stroke Father    Hypertension Mother    Dementia Mother    Bladder Cancer Neg Hx    Kidney cancer Neg Hx     Social History:  reports that he quit smoking about 58 years ago. He has never used smokeless tobacco. He reports that he does not drink alcohol and does not use drugs.   Physical Exam: BP 126/84   Pulse 73   Ht 5\' 8"  (1.727 m)   Wt 193 lb (87.5 kg)   BMI 29.35 kg/m   Constitutional:  Alert and oriented, No acute distress. HEENT: [place] AT, moist mucus membranes.  Trachea midline, no masses. Cardiovascular: No clubbing, cyanosis,  or edema. Respiratory: Normal respiratory effort, no increased work of breathing. Skin: No rashes, bruises or suspicious lesions. Neurologic: Grossly intact, no focal deficits, moving all 4 extremities. Psychiatric: Normal mood and affect.  Laboratory Data: Lab Results  Component Value Date   WBC 6.9 04/20/2019   HGB 13.2 04/20/2019   HCT 38.3 (L) 04/20/2019   MCV 92.7 04/20/2019   PLT 155 04/20/2019    Lab Results  Component Value Date   CREATININE 0.99 04/20/2019    Pertinent Imaging: Results for orders placed or performed in visit on 03/25/21  BLADDER SCAN AMB NON-IMAGING  Result Value Ref Range   Scan Result 262 ml      Assessment & Plan:    1. Incomplete bladder emptying Chronic, self cath occasionally as needed  Isolated UTI over past year  On both cardura 8 mg and flomax which is redundant and may increase risk of orthostasis recommend stopping one or the other.  He will call back and let us know which one he plans to stop and will with note that in his chart.     - BLADDER SCAN AMB NON-IMAGING  F/u 1 year with IPSS/ PVR  Hollice Espy, MD  [place] [address] [place], [place] [address] [phone]

## 2021-06-01 ENCOUNTER — Other Ambulatory Visit: Payer: Self-pay

## 2021-06-01 ENCOUNTER — Ambulatory Visit: Payer: Medicare PPO | Admitting: Dermatology

## 2021-06-01 DIAGNOSIS — D692 Other nonthrombocytopenic purpura: Secondary | ICD-10-CM

## 2021-06-01 DIAGNOSIS — L820 Inflamed seborrheic keratosis: Secondary | ICD-10-CM

## 2021-06-01 DIAGNOSIS — D1800 Hemangioma unspecified site: Secondary | ICD-10-CM

## 2021-06-01 DIAGNOSIS — L821 Other seborrheic keratosis: Secondary | ICD-10-CM

## 2021-06-01 DIAGNOSIS — L570 Actinic keratosis: Secondary | ICD-10-CM

## 2021-06-01 DIAGNOSIS — Z86018 Personal history of other benign neoplasm: Secondary | ICD-10-CM | POA: Diagnosis not present

## 2021-06-01 DIAGNOSIS — Z1283 Encounter for screening for malignant neoplasm of skin: Secondary | ICD-10-CM

## 2021-06-01 DIAGNOSIS — L578 Other skin changes due to chronic exposure to nonionizing radiation: Secondary | ICD-10-CM

## 2021-06-01 DIAGNOSIS — L814 Other melanin hyperpigmentation: Secondary | ICD-10-CM

## 2021-06-01 DIAGNOSIS — D229 Melanocytic nevi, unspecified: Secondary | ICD-10-CM

## 2021-06-01 NOTE — Progress Notes (Signed)
Follow-Up Visit   Subjective  Marvin Jefferson is a 80 y.o. male who presents for the following: Annual Exam (Mole check, hx of Dysplastic nevus  ). The patient presents for Total-Body Skin Exam (TBSE) for skin cancer screening and mole check.  The following portions of the chart were reviewed this encounter and updated as appropriate:   Tobacco  Allergies  Meds  Problems  Med Hx  Surg Hx  Fam Hx     Patient here for full body skin exam and skin cancer screening.  Review of Systems:  No other skin or systemic complaints except as noted in HPI or Assessment and Plan.  Objective  Well appearing patient in no apparent distress; mood and affect are within normal limits.  A full examination was performed including scalp, head, eyes, ears, nose, lips, neck, chest, axillae, abdomen, back, buttocks, bilateral upper extremities, bilateral lower extremities, hands, feet, fingers, toes, fingernails, and toenails. All findings within normal limits unless otherwise noted below.  Right Forehead x 9 (9) Erythematous thin papules/macules with gritty scale.   left back x 1 Erythematous keratotic or waxy stuck-on papule or plaque.    Assessment & Plan  Actinic keratosis (9) Right Forehead x 9  Actinic keratoses are precancerous spots that appear secondary to cumulative UV radiation exposure/sun exposure over time. They are chronic with expected duration over 1 year. A portion of actinic keratoses will progress to squamous cell carcinoma of the skin. It is not possible to reliably predict which spots will progress to skin cancer and so treatment is recommended to prevent development of skin cancer.  Recommend daily broad spectrum sunscreen SPF 30+ to sun-exposed areas, reapply every 2 hours as needed.  Recommend staying in the shade or wearing long sleeves, sun glasses (UVA+UVB protection) and wide brim hats (4-inch brim around the entire circumference of the hat). Call for new or changing  lesions.  Destruction of lesion - Right Forehead x 9 Complexity: simple   Destruction method: cryotherapy   Informed consent: discussed and consent obtained   Timeout:  patient name, date of birth, surgical site, and procedure verified Lesion destroyed using liquid nitrogen: Yes   Region frozen until ice ball extended beyond lesion: Yes   Outcome: patient tolerated procedure well with no complications   Post-procedure details: wound care instructions given    Inflamed seborrheic keratosis left back x 1  Prior to procedure, discussed risks of blister formation, small wound, skin dyspigmentation, or rare scar following cryotherapy. Recommend Vaseline ointment to treated areas while healing.   Destruction of lesion - left back x 1 Complexity: simple   Destruction method: cryotherapy   Informed consent: discussed and consent obtained   Timeout:  patient name, date of birth, surgical site, and procedure verified Lesion destroyed using liquid nitrogen: Yes   Region frozen until ice ball extended beyond lesion: Yes   Outcome: patient tolerated procedure well with no complications   Post-procedure details: wound care instructions given    Skin cancer screening  History of Dysplastic Nevi - No evidence of recurrence today - Recommend regular full body skin exams - Recommend daily broad spectrum sunscreen SPF 30+ to sun-exposed areas, reapply every 2 hours as needed.  - Call if any new or changing lesions are noted between office visits   Purpura - Chronic; persistent and recurrent.  Treatable, but not curable. - Violaceous macules and patches at arms  - Benign - Related to trauma, age, sun damage and/or use of blood thinners,  chronic use of topical and/or oral steroids - Observe - Can use OTC arnica containing moisturizer such as Dermend Bruise Formula if desired - Call for worsening or other concerns  Lentigines - Scattered tan macules - Due to sun exposure - Benign-appering,  observe - Recommend daily broad spectrum sunscreen SPF 30+ to sun-exposed areas, reapply every 2 hours as needed. - Call for any changes  Seborrheic Keratoses - Stuck-on, waxy, tan-brown papules and/or plaques  - Benign-appearing - Discussed benign etiology and prognosis. - Observe - Call for any changes  Melanocytic Nevi - Tan-brown and/or pink-flesh-colored symmetric macules and papules - Benign appearing on exam today - Observation - Call clinic for new or changing moles - Recommend daily use of broad spectrum spf 30+ sunscreen to sun-exposed areas.   Hemangiomas - Red papules - Discussed benign nature - Observe - Call for any changes  Actinic Damage - Chronic condition, secondary to cumulative UV/sun exposure - diffuse scaly erythematous macules with underlying dyspigmentation - Recommend daily broad spectrum sunscreen SPF 30+ to sun-exposed areas, reapply every 2 hours as needed.  - Staying in the shade or wearing long sleeves, sun glasses (UVA+UVB protection) and wide brim hats (4-inch brim around the entire circumference of the hat) are also recommended for sun protection.  - Call for new or changing lesions.  Skin cancer screening performed today.   Return for 1 year tbse . IRuthell Rummage, CMA, am acting as scribe for Sarina Ser, MD. Documentation: I have reviewed the above documentation for accuracy and completeness, and I agree with the above.  Sarina Ser, MD

## 2021-06-01 NOTE — Patient Instructions (Addendum)
Actinic keratoses are precancerous spots that appear secondary to cumulative UV radiation exposure/sun exposure over time. They are chronic with expected duration over 1 year. A portion of actinic keratoses will progress to squamous cell carcinoma of the skin. It is not possible to reliably predict which spots will progress to skin cancer and so treatment is recommended to prevent development of skin cancer.  Recommend daily broad spectrum sunscreen SPF 30+ to sun-exposed areas, reapply every 2 hours as needed.  Recommend staying in the shade or wearing long sleeves, sun glasses (UVA+UVB protection) and wide brim hats (4-inch brim around the entire circumference of the hat). Call for new or changing lesions.   Cryotherapy Aftercare  Wash gently with soap and water everyday.   Apply Vaseline and Band-Aid daily until healed.   If you have any questions or concerns for your doctor, please call our main line at [phone] and press option 4 to reach your doctor's medical assistant. If no one answers, please leave a voicemail as directed and we will return your call as soon as possible. Messages left after 4 pm will be answered the following business day.   You may also send us a message via MyChart. We typically respond to MyChart messages within 1-2 business days.  For prescription refills, please ask your pharmacy to contact our office. Our fax number is [phone].  If you have an urgent issue when the clinic is closed that cannot wait until the next business day, you can page your doctor at the number below.    Please note that while we do our best to be available for urgent issues outside of office hours, we are not available 24/7.   If you have an urgent issue and are unable to reach us, you may choose to seek medical care at your doctor's office, retail clinic, urgent care center, or emergency room.  If you have a medical emergency, please immediately call 911 or go to the emergency  department.  Pager Numbers  - Dr. Kowalski: [phone]  - Dr. Moye: [phone]  - Dr. Stewart: [phone]  In the event of inclement weather, please call our main line at [phone] for an update on the status of any delays or closures.  Dermatology Medication Tips: Please keep the boxes that topical medications come in in order to help keep track of the instructions about where and how to use these. Pharmacies typically print the medication instructions only on the boxes and not directly on the medication tubes.   If your medication is too expensive, please contact our office at [phone] option 4 or send us a message through MyChart.   We are unable to tell what your co-pay for medications will be in advance as this is different depending on your insurance coverage. However, we may be able to find a substitute medication at lower cost or fill out paperwork to get insurance to cover a needed medication.   If a prior authorization is required to get your medication covered by your insurance company, please allow us 1-2 business days to complete this process.  Drug prices often vary depending on where the prescription is filled and some pharmacies may offer cheaper prices.  The website www.goodrx.com contains coupons for medications through different pharmacies. The prices here do not account for what the cost may be with help from insurance (it may be cheaper with your insurance), but the website can give you the price if you did not use any insurance.  - You   can print the associated coupon and take it with your prescription to the pharmacy.  - You may also stop by our office during regular business hours and pick up a GoodRx coupon card.  - If you need your prescription sent electronically to a different pharmacy, notify our office through [facility] MyChart or by phone at [phone] option 4.  

## 2021-06-03 ENCOUNTER — Encounter: Payer: Self-pay | Admitting: Dermatology

## 2022-03-30 ENCOUNTER — Ambulatory Visit: Payer: Medicare PPO | Admitting: Urology

## 2022-03-30 ENCOUNTER — Encounter: Payer: Self-pay | Admitting: Urology

## 2022-03-30 VITALS — BP 125/79 | HR 62 | Ht 68.0 in | Wt 187.0 lb

## 2022-03-30 DIAGNOSIS — R339 Retention of urine, unspecified: Secondary | ICD-10-CM

## 2022-03-30 DIAGNOSIS — N401 Enlarged prostate with lower urinary tract symptoms: Secondary | ICD-10-CM | POA: Diagnosis not present

## 2022-03-30 LAB — BLADDER SCAN AMB NON-IMAGING: Scan Result: 184

## 2022-03-30 NOTE — Progress Notes (Signed)
03/30/22 2:39 PM   Marvin Jefferson January 10, 1941 [id number]  Referring provider:  Rusty Aus, MD [facility] [place],  [place] [address] Chief Complaint  Patient presents with   Urinary Retention      HPI: Marvin Jefferson is a 81 y.o.male with a personal history of BPH s/p HoLEP in 2017 and incomplete bladder emptying who self caths as needed  who presents for routine annual follow-up.  His most recent PSA was 1.45 on 03/31/2021.   He reports that he is not self cathing anymore. He is taking doxazosin and Flomax, this was addressed last year but he was not able to confirm that he was in fact taking both he never called [place] back to let us know.  He does mention today that he has had issues with his Mnire's disease.  He is often dizzy both room spinning and when he stands up.  Minimal urinary symptoms.  He is pleased as below.  No UTIs this year.  No dysuria or gross hematuria.   IPSS     Row Name 03/30/22 1300         International Prostate Symptom Score   How often have you had the sensation of not emptying your bladder? Less than half the time     How often have you had to urinate less than every two hours? Not at All     How often have you found you stopped and started again several times when you urinated? Less than half the time     How often have you found it difficult to postpone urination? Less than half the time     How often have you had a weak urinary stream? Not at All     How often have you had to strain to start urination? Less than half the time     How many times did you typically get up at night to urinate? None     Total IPSS Score 8       Quality of Life due to urinary symptoms   If you were to spend the rest of your life with your urinary condition just the way it is now how would you feel about that? Delighted              Score:  1-7 Mild 8-19 Moderate 20-35 Severe   PMH: Past Medical  History:  Diagnosis Date   Anxiety    Chronic back pain    DDD (degenerative disc disease), cervical    DDD (degenerative disc disease), lumbar    Dementia (HCC)    Depression    History of dysplastic nevus 12/23/2009   Right upper back 4.0 cm lat to spine   Hypertension    Meniere's disease    Pneumonia 9024   Umbilical hernia    UTI (urinary tract infection)     Surgical History: Past Surgical History:  Procedure Laterality Date   APPENDECTOMY     COLONOSCOPY WITH PROPOFOL N/A 05/23/2017   Procedure: COLONOSCOPY WITH PROPOFOL;  Surgeon: Lollie Sails, MD;  Location[facility] ENDOSCOPY;  Service: Endoscopy;  Laterality: N/A;   CORONARY/GRAFT ACUTE MI REVASCULARIZATION N/A 04/19/2019   Procedure: Coronary/Graft Acute MI Revascularization;  Surgeon: Isaias Cowman, MD;  Location: [facility];  Service: Cardiovascular;  Laterality: N/A;   ESOPHAGOGASTRODUODENOSCOPY (EGD) WITH PROPOFOL N/A 05/23/2017   Procedure: ESOPHAGOGASTRODUODENOSCOPY (EGD) WITH PROPOFOL;  Surgeon: Lollie Sails, MD;  Location: [facility] ENDOSCOPY;  Service: Endoscopy;  Laterality: N/A;   GANGLION CYST EXCISION Right    wrist   HERNIA REPAIR     umbilical hernia   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 N/A 08/23/2016   Procedure: 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;  Surgeon: Hollice Espy, MD;  Location: [facility] ORS;  Service: Urology;  Laterality: N/A;   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 N/A 12/06/2016   Procedure: 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;  Surgeon: Hollice Espy, MD;  Location: [facility] ORS;  Service: Urology;  Laterality: N/A;   LEFT HEART CATH AND CORONARY ANGIOGRAPHY N/A 04/19/2019   Procedure: LEFT HEART CATH AND CORONARY ANGIOGRAPHY;  Surgeon: Isaias Cowman, MD;  Location: [facility];  Service: Cardiovascular;  Laterality: N/A;    Home Medications:  Allergies as of 03/30/2022        Reactions   Clarithromycin Other (See Comments)   Terrible taste and smell        Medication List        Accurate as of March 30, 2022  2:39 PM. If you have any questions, ask your nurse or doctor.          STOP taking these medications    clopidogrel 75 MG tablet Commonly known as: PLAVIX Stopped by: Hollice Espy, MD   gabapentin 100 MG capsule Commonly known as: NEURONTIN Stopped by: Hollice Espy, MD   tamsulosin 0.4 MG Caps capsule Commonly known as: FLOMAX Stopped by: Hollice Espy, MD       TAKE these medications    acetaminophen 500 MG tablet Commonly known as: TYLENOL Take 1,000 mg by mouth every 6 (six) hours as needed for mild pain.   ALPRAZolam 0.25 MG tablet Commonly known as: XANAX Take 0.25 mg by mouth 2 (two) times daily as needed (dizziness).   amLODipine 5 MG tablet Commonly known as: NORVASC Take by mouth.   ARIPiprazole 2 MG tablet Commonly known as: ABILIFY Take by mouth.   aspirin 81 MG chewable tablet Chew 1 tablet (81 mg total) by mouth daily.   atorvastatin 80 MG tablet Commonly known as: LIPITOR Take 1 tablet (80 mg total) by mouth daily at 6 PM.   doxazosin 4 MG tablet Commonly known as: CARDURA Take 4 mg by mouth 2 (two) times daily.   ferrous sulfate 325 (65 FE) MG tablet 325 mg daily with breakfast   FLUoxetine 40 MG capsule Commonly known as: PROZAC Take 40 mg by mouth daily.   fluticasone 50 MCG/ACT nasal spray Commonly known as: FLONASE USE 2 SPRAYS IN EACH NOSTRIL EVERY DAY AS NEEDED FOR ALLERGIES   galantamine 12 MG tablet Commonly known as: RAZADYNE Take 12 mg by mouth 2 (two) times daily.   hydrocortisone cream 1 % Apply 1 application topically daily as needed for itching.   meclizine 25 MG tablet Commonly known as: ANTIVERT Take 25 mg by mouth 3 (three) times daily as needed for dizziness.   melatonin 5 MG Tabs Take 1 tablet (5 mg total) by mouth at bedtime.   memantine 10 MG  tablet Commonly known as: NAMENDA Take 10 mg by mouth 2 (two) times daily.   methylphenidate 5 MG tablet Commonly known as: RITALIN Take 1 tablet by mouth 2 (two) times daily.   metoprolol tartrate 25 MG tablet Commonly known as: LOPRESSOR Take by mouth.   multivitamin with minerals Tabs tablet Take 1 tablet by mouth daily.   nystatin cream Commonly known as: MYCOSTATIN Apply 1 application  topically 2 (two) times daily.   OVER THE COUNTER MEDICATION Place 0.75 mLs under the tongue at bedtime.   PEG 3350 17 GM/SCOOP Powd Take 17 g by mouth daily.   sodium chloride 0.65 % Soln nasal spray Commonly known as: OCEAN Place 2 sprays into both nostrils 2 (two) times daily as needed for congestion.   traMADol 50 MG tablet Commonly known as: ULTRAM Take 50 mg by mouth every 6 (six) hours as needed for moderate pain.   triamterene-hydrochlorothiazide 37.5-25 MG capsule Commonly known as: DYAZIDE Take 1 capsule by mouth every morning.   vitamin B-12 500 MCG tablet Commonly known as: CYANOCOBALAMIN Take by mouth.   Vitamin D3 50 MCG (2000 UT) capsule Take 2,000 Units by mouth daily.   vitamin E 1000 UNIT capsule Take 1,000 Units by mouth daily.        Allergies:  Allergies  Allergen Reactions   Clarithromycin Other (See Comments)    Terrible taste and smell    Family History: Family History  Problem Relation Age of Onset   Prostate cancer Father    Hypertension Father    Stroke Father    Hypertension Mother    Dementia Mother    Bladder Cancer Neg Hx    Kidney cancer Neg Hx     Social History:  reports that he quit smoking about 59 years ago. His smoking use included cigarettes. He has never used smokeless tobacco. He reports that he does not drink alcohol and does not use drugs.   Physical Exam: BP 125/79   Pulse 62   Ht '5\' 8"'$  (1.727 m)   Wt 187 lb (84.8 kg)   BMI 28.43 kg/m   Constitutional:  Alert and oriented, No acute distress. HEENT: [place] AT,  moist mucus membranes.  Trachea midline, no masses. Cardiovascular: No clubbing, cyanosis, or edema. Respiratory: Normal respiratory effort, no increased work of breathing. Skin: No rashes, bruises or suspicious lesions. Neurologic: Grossly intact, no focal deficits, moving all 4 extremities. Psychiatric: Normal mood and affect.  Laboratory Data: Lab Results  Component Value Date   CREATININE 0.99 04/20/2019   No results found for: "HGBA1C"  Pertinent Imaging: Results for orders placed or performed in visit on 03/30/22  Bladder Scan (Post Void Residual) in office  Result Value Ref Range   Scan Result 184     Assessment & Plan:    1. Incomplete bladder emptying/ BPH - Chronic, improved s/p HoLEP - Emptying adequately today with PVR of 184 ml.  - In light of improved urinary symptoms and dizziness, will stop Flomax -PSA appropriate -No infections of sequela of incomplete emptying, will continue to monitor  Follow-up in 1 year with IPSS/PVR   Conley Rolls as a scribe for Hollice Espy, MD.,have documented all relevant documentation on the behalf of Hollice Espy, MD,as directed by  Hollice Espy, MD while in the presence of Hollice Espy, MD.  I have reviewed the above documentation for accuracy and completeness, and I agree with the above.   Hollice Espy, MD   [facility] [address], [place], [place] [address] [phone]

## 2022-06-07 ENCOUNTER — Encounter: Payer: Medicare PPO | Admitting: Dermatology

## 2022-06-14 ENCOUNTER — Encounter: Payer: Medicare PPO | Admitting: Dermatology

## 2022-06-28 ENCOUNTER — Ambulatory Visit: Payer: Medicare PPO | Admitting: Dermatology

## 2022-06-28 DIAGNOSIS — Z86018 Personal history of other benign neoplasm: Secondary | ICD-10-CM | POA: Diagnosis not present

## 2022-06-28 DIAGNOSIS — L578 Other skin changes due to chronic exposure to nonionizing radiation: Secondary | ICD-10-CM | POA: Diagnosis not present

## 2022-06-28 DIAGNOSIS — L814 Other melanin hyperpigmentation: Secondary | ICD-10-CM

## 2022-06-28 DIAGNOSIS — L570 Actinic keratosis: Secondary | ICD-10-CM

## 2022-06-28 DIAGNOSIS — D239 Other benign neoplasm of skin, unspecified: Secondary | ICD-10-CM

## 2022-06-28 DIAGNOSIS — L821 Other seborrheic keratosis: Secondary | ICD-10-CM

## 2022-06-28 DIAGNOSIS — D229 Melanocytic nevi, unspecified: Secondary | ICD-10-CM

## 2022-06-28 DIAGNOSIS — D1800 Hemangioma unspecified site: Secondary | ICD-10-CM

## 2022-06-28 DIAGNOSIS — Z1283 Encounter for screening for malignant neoplasm of skin: Secondary | ICD-10-CM | POA: Diagnosis not present

## 2022-06-28 NOTE — Progress Notes (Unsigned)
Follow-Up Visit   Subjective  Marvin Jefferson is a 81 y.o. male who presents for the following: Annual Exam (1 year tbse, itchy areas at back for a couple of weeks. ). The patient presents for Total-Body Skin Exam (TBSE) for skin cancer screening and mole check.  The patient has spots, moles and lesions to be evaluated, some may be new or changing and the patient has concerns that these could be cancer.  The following portions of the chart were reviewed this encounter and updated as appropriate:  Tobacco  Allergies  Meds  Problems  Med Hx  Surg Hx  Fam Hx     Review of Systems: No other skin or systemic complaints except as noted in HPI or Assessment and Plan.  Objective  Well appearing patient in no apparent distress; mood and affect are within normal limits.  A full examination was performed including scalp, head, eyes, ears, nose, lips, neck, chest, axillae, abdomen, back, buttocks, bilateral upper extremities, bilateral lower extremities, hands, feet, fingers, toes, fingernails, and toenails. All findings within normal limits unless otherwise noted below.  forehead x 3 (3) Erythematous thin papules/macules with gritty scale.    Assessment & Plan  Actinic keratosis (3) forehead x 3  Actinic keratoses are precancerous spots that appear secondary to cumulative UV radiation exposure/sun exposure over time. They are chronic with expected duration over 1 year. A portion of actinic keratoses will progress to squamous cell carcinoma of the skin. It is not possible to reliably predict which spots will progress to skin cancer and so treatment is recommended to prevent development of skin cancer.  Recommend daily broad spectrum sunscreen SPF 30+ to sun-exposed areas, reapply every 2 hours as needed.  Recommend staying in the shade or wearing long sleeves, sun glasses (UVA+UVB protection) and wide brim hats (4-inch brim around the entire circumference of the hat). Call for new or changing  lesions.  Destruction of lesion - forehead x 3 Complexity: simple   Destruction method: cryotherapy   Informed consent: discussed and consent obtained   Timeout:  patient name, date of birth, surgical site, and procedure verified Lesion destroyed using liquid nitrogen: Yes   Region frozen until ice ball extended beyond lesion: Yes   Outcome: patient tolerated procedure well with no complications   Post-procedure details: wound care instructions given   Additional details:  Prior to procedure, discussed risks of blister formation, small wound, skin dyspigmentation, or rare scar following cryotherapy. Recommend Vaseline ointment to treated areas while healing.   Lentigines - Scattered tan macules - Due to sun exposure - Benign-appearing, observe - Recommend daily broad spectrum sunscreen SPF 30+ to sun-exposed areas, reapply every 2 hours as needed. - Call for any changes  Seborrheic Keratoses - Stuck-on, waxy, tan-brown papules and/or plaques  - Benign-appearing - Discussed benign etiology and prognosis. - Observe - Call for any changes  Melanocytic Nevi - Tan-brown and/or pink-flesh-colored symmetric macules and papules - Benign appearing on exam today - Observation - Call clinic for new or changing moles - Recommend daily use of broad spectrum spf 30+ sunscreen to sun-exposed areas.   Hemangiomas - Red papules - Discussed benign nature - Observe - Call for any changes  Actinic Damage - Chronic condition, secondary to cumulative UV/sun exposure - diffuse scaly erythematous macules with underlying dyspigmentation - Recommend daily broad spectrum sunscreen SPF 30+ to sun-exposed areas, reapply every 2 hours as needed.  - Staying in the shade or wearing long sleeves, sun glasses (UVA+UVB  protection) and wide brim hats (4-inch brim around the entire circumference of the hat) are also recommended for sun protection.  - Call for new or changing lesions.  History of  Dysplastic Nevi Right upper back 4 cm lat to spine 12/2009 - No evidence of recurrence today - Recommend regular full body skin exams - Recommend daily broad spectrum sunscreen SPF 30+ to sun-exposed areas, reapply every 2 hours as needed.  - Call if any new or changing lesions are noted between office visits  Skin cancer screening performed today. Return for 1 - 2 year tbse . IRuthell Rummage, CMA, am acting as scribe for Sarina Ser, MD. Documentation: I have reviewed the above documentation for accuracy and completeness, and I agree with the above.  Sarina Ser, MD

## 2022-06-28 NOTE — Patient Instructions (Addendum)
Actinic keratoses are precancerous spots that appear secondary to cumulative UV radiation exposure/sun exposure over time. They are chronic with expected duration over 1 year. A portion of actinic keratoses will progress to squamous cell carcinoma of the skin. It is not possible to reliably predict which spots will progress to skin cancer and so treatment is recommended to prevent development of skin cancer.  Recommend daily broad spectrum sunscreen SPF 30+ to sun-exposed areas, reapply every 2 hours as needed.  Recommend staying in the shade or wearing long sleeves, sun glasses (UVA+UVB protection) and wide brim hats (4-inch brim around the entire circumference of the hat). Call for new or changing lesions.   Cryotherapy Aftercare  Wash gently with soap and water everyday.   Apply Vaseline and Band-Aid daily until healed.    Seborrheic Keratosis  What causes seborrheic keratoses? Seborrheic keratoses are harmless, common skin growths that first appear during adult life.  As time goes by, more growths appear.  Some people may develop a large number of them.  Seborrheic keratoses appear on both covered and uncovered body parts.  They are not caused by sunlight.  The tendency to develop seborrheic keratoses can be inherited.  They vary in color from skin-colored to gray, brown, or even black.  They can be either smooth or have a rough, warty surface.   Seborrheic keratoses are superficial and look as if they were stuck on the skin.  Under the microscope this type of keratosis looks like layers upon layers of skin.  That is why at times the top layer may seem to fall off, but the rest of the growth remains and re-grows.    Treatment Seborrheic keratoses do not need to be treated, but can easily be removed in the office.  Seborrheic keratoses often cause symptoms when they rub on clothing or jewelry.  Lesions can be in the way of shaving.  If they become inflamed, they can cause itching, soreness, or  burning.  Removal of a seborrheic keratosis can be accomplished by freezing, burning, or surgery. If any spot bleeds, scabs, or grows rapidly, please return to have it checked, as these can be an indication of a skin cancer.  Melanoma ABCDEs  Melanoma is the most dangerous type of skin cancer, and is the leading cause of death from skin disease.  You are more likely to develop melanoma if you: Have light-colored skin, light-colored eyes, or red or blond hair Spend a lot of time in the sun Tan regularly, either outdoors or in a tanning bed Have had blistering sunburns, especially during childhood Have a close family member who has had a melanoma Have atypical moles or large birthmarks  Early detection of melanoma is key since treatment is typically straightforward and cure rates are extremely high if we catch it early.   The first sign of melanoma is often a change in a mole or a new dark spot.  The ABCDE system is a way of remembering the signs of melanoma.  A for asymmetry:  The two halves do not match. B for border:  The edges of the growth are irregular. C for color:  A mixture of colors are present instead of an even brown color. D for diameter:  Melanomas are usually (but not always) greater than 6mm - the size of a pencil eraser. E for evolution:  The spot keeps changing in size, shape, and color.  Please check your skin once per month between visits. You can use a small   mirror in front and a large mirror behind you to keep an eye on the back side or your body.   If you see any new or changing lesions before your next follow-up, please call to schedule a visit.  Please continue daily skin protection including broad spectrum sunscreen SPF 30+ to sun-exposed areas, reapplying every 2 hours as needed when you're outdoors.   Staying in the shade or wearing long sleeves, sun glasses (UVA+UVB protection) and wide brim hats (4-inch brim around the entire circumference of the hat) are also  recommended for sun protection.    Due to recent changes in healthcare laws, you may see results of your pathology and/or laboratory studies on MyChart before the doctors have had a chance to review them. We understand that in some cases there may be results that are confusing or concerning to you. Please understand that not all results are received at the same time and often the doctors may need to interpret multiple results in order to provide you with the best plan of care or course of treatment. Therefore, we ask that you please give us 2 business days to thoroughly review all your results before contacting the office for clarification. Should we see a critical lab result, you will be contacted sooner.   If You Need Anything After Your Visit  If you have any questions or concerns for your doctor, please call our main line at [phone] and press option 4 to reach your doctor's medical assistant. If no one answers, please leave a voicemail as directed and we will return your call as soon as possible. Messages left after 4 pm will be answered the following business day.   You may also send us a message via MyChart. We typically respond to MyChart messages within 1-2 business days.  For prescription refills, please ask your pharmacy to contact our office. Our fax number is [phone].  If you have an urgent issue when the clinic is closed that cannot wait until the next business day, you can page your doctor at the number below.    Please note that while we do our best to be available for urgent issues outside of office hours, we are not available 24/7.   If you have an urgent issue and are unable to reach us, you may choose to seek medical care at your doctor's office, retail clinic, urgent care center, or emergency room.  If you have a medical emergency, please immediately call 911 or go to the emergency department.  Pager Numbers  - Dr. Kowalski: [phone]  - Dr. Moye:  [phone]  - Dr. Stewart: [phone]  In the event of inclement weather, please call our main line at [phone] for an update on the status of any delays or closures.  Dermatology Medication Tips: Please keep the boxes that topical medications come in in order to help keep track of the instructions about where and how to use these. Pharmacies typically print the medication instructions only on the boxes and not directly on the medication tubes.   If your medication is too expensive, please contact our office at [phone] option 4 or send us a message through MyChart.   We are unable to tell what your co-pay for medications will be in advance as this is different depending on your insurance coverage. However, we may be able to find a substitute medication at lower cost or fill out paperwork to get insurance to cover a needed medication.   If a prior authorization   is required to get your medication covered by your insurance company, please allow us 1-2 business days to complete this process.  Drug prices often vary depending on where the prescription is filled and some pharmacies may offer cheaper prices.  The website www.goodrx.com contains coupons for medications through different pharmacies. The prices here do not account for what the cost may be with help from insurance (it may be cheaper with your insurance), but the website can give you the price if you did not use any insurance.  - You can print the associated coupon and take it with your prescription to the pharmacy.  - You may also stop by our office during regular business hours and pick up a GoodRx coupon card.  - If you need your prescription sent electronically to a different pharmacy, notify our office through [facility] MyChart or by phone at [phone] option 4.     Si Usted Necesita Algo Despus de Su Visita  Tambin puede enviarnos un mensaje a travs de MyChart. Por lo general respondemos a los mensajes de  MyChart en el transcurso de 1 a 2 das hbiles.  Para renovar recetas, por favor pida a su farmacia que se ponga en contacto con nuestra oficina. Nuestro nmero de fax es el [phone].  Si tiene un asunto urgente cuando la clnica est cerrada y que no puede esperar hasta el siguiente da hbil, puede llamar/localizar a su doctor(a) al nmero que aparece a continuacin.   Por favor, tenga en cuenta que aunque hacemos todo lo posible para estar disponibles para asuntos urgentes fuera del horario de oficina, no estamos disponibles las 24 horas del da, los 7 das de la semana.   Si tiene un problema urgente y no puede comunicarse con nosotros, puede optar por buscar atencin mdica  en el consultorio de su doctor(a), en una clnica privada, en un centro de atencin urgente o en una sala de emergencias.  Si tiene una emergencia mdica, por favor llame inmediatamente al 911 o vaya a la sala de emergencias.  Nmeros de bper  - Dr. Kowalski: [phone]  - Dra. Moye: [phone]  - Dra. Stewart: [phone]  En caso de inclemencias del tiempo, por favor llame a nuestra lnea principal al [phone] para una actualizacin sobre el estado de cualquier retraso o cierre.  Consejos para la medicacin en dermatologa: Por favor, guarde las cajas en las que vienen los medicamentos de uso tpico para ayudarle a seguir las instrucciones sobre dnde y cmo usarlos. Las farmacias generalmente imprimen las instrucciones del medicamento slo en las cajas y no directamente en los tubos del medicamento.   Si su medicamento es muy caro, por favor, pngase en contacto con nuestra oficina llamando al [phone] y presione la opcin 4 o envenos un mensaje a travs de MyChart.   No podemos decirle cul ser su copago por los medicamentos por adelantado ya que esto es diferente dependiendo de la cobertura de su seguro. Sin embargo, es posible que podamos encontrar un medicamento sustituto a menor costo o  llenar un formulario para que el seguro cubra el medicamento que se considera necesario.   Si se requiere una autorizacin previa para que su compaa de seguros cubra su medicamento, por favor permtanos de 1 a 2 das hbiles para completar este proceso.  Los precios de los medicamentos varan con frecuencia dependiendo del lugar de dnde se surte la receta y alguna farmacias pueden ofrecer precios ms baratos.  El sitio web www.goodrx.com tiene cupones para medicamentos   de diferentes farmacias. Los precios aqu no tienen en cuenta lo que podra costar con la ayuda del seguro (puede ser ms barato con su seguro), pero el sitio web puede darle el precio si no utiliz ningn seguro.  - Puede imprimir el cupn correspondiente y llevarlo con su receta a la farmacia.  - Tambin puede pasar por nuestra oficina durante el horario de atencin regular y recoger una tarjeta de cupones de GoodRx.  - Si necesita que su receta se enve electrnicamente a una farmacia diferente, informe a nuestra oficina a travs de MyChart de [facility] o por telfono llamando al [phone] y presione la opcin 4.  

## 2022-06-30 ENCOUNTER — Encounter: Payer: Self-pay | Admitting: Dermatology

## 2022-11-15 ENCOUNTER — Ambulatory Visit: Payer: Medicare PPO | Admitting: Anesthesiology

## 2022-11-15 ENCOUNTER — Encounter: Payer: Self-pay | Admitting: Gastroenterology

## 2022-11-15 ENCOUNTER — Ambulatory Visit
Admission: RE | Admit: 2022-11-15 | Discharge: 2022-11-15 | Disposition: A | Discharge status: Home / Self Care | Payer: Medicare PPO | Attending: Gastroenterology | Admitting: Gastroenterology

## 2022-11-15 ENCOUNTER — Encounter
Admission: RE | Disposition: A | Discharge status: Home / Self Care | Payer: Self-pay | Source: Home / Self Care | Attending: Gastroenterology

## 2022-11-15 DIAGNOSIS — F419 Anxiety disorder, unspecified: Secondary | ICD-10-CM | POA: Diagnosis not present

## 2022-11-15 DIAGNOSIS — Z87891 Personal history of nicotine dependence: Secondary | ICD-10-CM | POA: Diagnosis not present

## 2022-11-15 DIAGNOSIS — D122 Benign neoplasm of ascending colon: Secondary | ICD-10-CM | POA: Insufficient documentation

## 2022-11-15 DIAGNOSIS — I252 Old myocardial infarction: Secondary | ICD-10-CM | POA: Insufficient documentation

## 2022-11-15 DIAGNOSIS — K640 First degree hemorrhoids: Secondary | ICD-10-CM | POA: Insufficient documentation

## 2022-11-15 DIAGNOSIS — D124 Benign neoplasm of descending colon: Secondary | ICD-10-CM | POA: Diagnosis not present

## 2022-11-15 DIAGNOSIS — D120 Benign neoplasm of cecum: Secondary | ICD-10-CM | POA: Insufficient documentation

## 2022-11-15 DIAGNOSIS — D123 Benign neoplasm of transverse colon: Secondary | ICD-10-CM | POA: Insufficient documentation

## 2022-11-15 DIAGNOSIS — Z86010 Personal history of colonic polyps: Secondary | ICD-10-CM | POA: Diagnosis not present

## 2022-11-15 DIAGNOSIS — D128 Benign neoplasm of rectum: Secondary | ICD-10-CM | POA: Insufficient documentation

## 2022-11-15 DIAGNOSIS — F32A Depression, unspecified: Secondary | ICD-10-CM | POA: Diagnosis not present

## 2022-11-15 DIAGNOSIS — D175 Benign lipomatous neoplasm of intra-abdominal organs: Secondary | ICD-10-CM | POA: Insufficient documentation

## 2022-11-15 DIAGNOSIS — I10 Essential (primary) hypertension: Secondary | ICD-10-CM | POA: Insufficient documentation

## 2022-11-15 DIAGNOSIS — Z1211 Encounter for screening for malignant neoplasm of colon: Secondary | ICD-10-CM | POA: Insufficient documentation

## 2022-11-15 DIAGNOSIS — G4730 Sleep apnea, unspecified: Secondary | ICD-10-CM | POA: Insufficient documentation

## 2022-11-15 DIAGNOSIS — K5730 Diverticulosis of large intestine without perforation or abscess without bleeding: Secondary | ICD-10-CM | POA: Diagnosis not present

## 2022-11-15 HISTORY — PX: COLONOSCOPY WITH PROPOFOL: SHX5780

## 2022-11-15 HISTORY — DX: Sleep apnea, unspecified: G47.30

## 2022-11-15 SURGERY — COLONOSCOPY WITH PROPOFOL
Anesthesia: General

## 2022-11-15 MED ORDER — SODIUM CHLORIDE 0.9 % IV SOLN: INTRAVENOUS | Status: DC

## 2022-11-15 MED ORDER — EPHEDRINE SULFATE (PRESSORS) 50 MG/ML IJ SOLN
INTRAMUSCULAR | Status: DC | PRN
  Administered 2022-11-15: 5 mg via INTRAVENOUS
  Administered 2022-11-15 (×2): 10 mg via INTRAVENOUS

## 2022-11-15 MED ORDER — PROPOFOL 10 MG/ML IV BOLUS
INTRAVENOUS | Status: DC | PRN
  Administered 2022-11-15: 10 mg via INTRAVENOUS
  Administered 2022-11-15 (×2): 20 mg via INTRAVENOUS

## 2022-11-15 MED ORDER — PROPOFOL 500 MG/50ML IV EMUL
INTRAVENOUS | Status: DC | PRN
  Administered 2022-11-15: 50 ug/kg/min via INTRAVENOUS

## 2022-11-15 MED ORDER — PHENYLEPHRINE 80 MCG/ML (10ML) SYRINGE FOR IV PUSH (FOR BLOOD PRESSURE SUPPORT)
PREFILLED_SYRINGE | INTRAVENOUS | Status: DC | PRN
  Administered 2022-11-15: 80 ug via INTRAVENOUS

## 2022-11-15 MED ORDER — LIDOCAINE HCL (CARDIAC) PF 100 MG/5ML IV SOSY
PREFILLED_SYRINGE | INTRAVENOUS | Status: DC | PRN
  Administered 2022-11-15: 50 mg via INTRAVENOUS

## 2022-11-15 MED ORDER — PHENYLEPHRINE 80 MCG/ML (10ML) SYRINGE FOR IV PUSH (FOR BLOOD PRESSURE SUPPORT)
PREFILLED_SYRINGE | INTRAVENOUS | Status: AC
  Filled 2022-11-15: qty 10

## 2022-11-15 NOTE — Anesthesia Postprocedure Evaluation (Signed)
Anesthesia Post Note  Patient: Marvin Jefferson  Procedure(s) Performed: COLONOSCOPY WITH PROPOFOL  Patient location during evaluation: PACU Anesthesia Type: General Level of consciousness: awake and alert, oriented and patient cooperative Pain management: pain level controlled Vital Signs Assessment: post-procedure vital signs reviewed and stable Respiratory status: spontaneous breathing, nonlabored ventilation and respiratory function stable Cardiovascular status: blood pressure returned to baseline and stable Postop Assessment: adequate PO intake Anesthetic complications: no   No notable events documented.   Last Vitals:  Vitals:   11/15/22 1405 11/15/22 1415  BP: 116/75 124/73  Pulse: 83   Resp: 15 (!) 26  Temp:    SpO2: 99% 100%    Last Pain:  Vitals:   11/15/22 1415  TempSrc:   PainSc: 0-No pain                 Darrin Nipper

## 2022-11-15 NOTE — H&P (Signed)
Pre-Procedure H&P   Patient ID: Marvin Jefferson is a 82 y.o. male.  Gastroenterology Provider: [name], DO  Referring Provider: Laurine Blazer, PA PCP: Rusty Aus, MD  Date: 11/15/2022  HPI Marvin Jefferson is a 82 y.o. male who presents today for Colonoscopy for Surveillance-personal history of colon polyps .  Patient with a personal history of polyps.  Last colonoscopy performed in August 2018 demonstrating 1 sessile serrated polyp in the descending colon along with hyperplastic polyps and left-sided diverticulosis.  Fair prep was noted at that time  Patient with regular daily bowel movements without melena hematochezia  No family history of colon cancer or colon polyps  Patient with previous normal colonoscopies in 2012 2010 and 2007  Status post appendectomy   Past Medical History:  Diagnosis Date   Anxiety    Chronic back pain    DDD (degenerative disc disease), cervical    DDD (degenerative disc disease), lumbar    Dementia ([place])    Depression    History of dysplastic nevus 12/23/2009   Right upper back 4.0 cm lat to spine   Hypertension    Meniere's disease    Pneumonia 2015   Sleep apnea    Umbilical hernia    UTI (urinary tract infection)     Past Surgical History:  Procedure Laterality Date   APPENDECTOMY     COLONOSCOPY WITH PROPOFOL N/A 05/23/2017   Procedure: COLONOSCOPY WITH PROPOFOL;  Surgeon: Lollie Sails, MD;  Location: [facility] ENDOSCOPY;  Service: Endoscopy;  Laterality: N/A;   CORONARY/GRAFT ACUTE MI REVASCULARIZATION N/A 04/19/2019   Procedure: Coronary/Graft Acute MI Revascularization;  Surgeon: Isaias Cowman, MD;  Location: [facility] CV LAB;  Service: Cardiovascular;  Laterality: N/A;   ESOPHAGOGASTRODUODENOSCOPY (EGD) WITH PROPOFOL N/A 05/23/2017   Procedure: ESOPHAGOGASTRODUODENOSCOPY (EGD) WITH PROPOFOL;  Surgeon: Lollie Sails, MD;  Location: [facility] ENDOSCOPY;  Service: Endoscopy;  Laterality: N/A;    GANGLION CYST EXCISION Right    wrist   HERNIA REPAIR     umbilical hernia   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 N/A 08/23/2016   Procedure: 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;  Surgeon: Hollice Espy, MD;  Location: [facility] ORS;  Service: Urology;  Laterality: N/A;   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 N/A 12/06/2016   Procedure: 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;  Surgeon: Hollice Espy, MD;  Location: [facility] ORS;  Service: Urology;  Laterality: N/A;   LEFT HEART CATH AND CORONARY ANGIOGRAPHY N/A 04/19/2019   Procedure: LEFT HEART CATH AND CORONARY ANGIOGRAPHY;  Surgeon: Isaias Cowman, MD;  Location: [facility];  Service: Cardiovascular;  Laterality: N/A;    Family History No h/o GI disease or malignancy  Review of Systems  Constitutional:  Negative for activity change, appetite change, chills, diaphoresis, fatigue, fever and unexpected weight change.  HENT:  Negative for trouble swallowing and voice change.   Respiratory:  Negative for shortness of breath and wheezing.   Cardiovascular:  Negative for chest pain, palpitations and leg swelling.  Gastrointestinal:  Negative for abdominal distention, abdominal pain, anal bleeding, blood in stool, constipation, diarrhea, nausea and vomiting.  Musculoskeletal:  Negative for arthralgias and myalgias.  Skin:  Negative for color change and pallor.  Neurological:  Negative for dizziness, syncope and weakness.  Psychiatric/Behavioral:  Negative for confusion. The patient is not nervous/anxious.   All other systems reviewed and are negative.    Medications No current facility-administered medications on file prior  to encounter.   Current Outpatient Medications on File Prior to Encounter  Medication Sig Dispense Refill   amLODipine (NORVASC) 5 MG tablet Take by mouth.     aspirin 81 MG chewable tablet Chew 1 tablet (81 mg total) by mouth  daily. 30 tablet 0   Multiple Vitamin (MULTIVITAMIN WITH MINERALS) TABS tablet Take 1 tablet by mouth daily. 30 tablet 0   vitamin B-12 (CYANOCOBALAMIN) 500 MCG tablet Take by mouth.     vitamin E 1000 UNIT capsule Take 1,000 Units by mouth daily.      acetaminophen (TYLENOL) 500 MG tablet Take 1,000 mg by mouth every 6 (six) hours as needed for mild pain.      ALPRAZolam (XANAX) 0.25 MG tablet Take 0.25 mg by mouth 2 (two) times daily as needed (dizziness).      ARIPiprazole (ABILIFY) 2 MG tablet Take by mouth.     atorvastatin (LIPITOR) 80 MG tablet Take 1 tablet (80 mg total) by mouth daily at 6 PM. 30 tablet 0   Cholecalciferol (VITAMIN D3) 2000 units capsule Take 2,000 Units by mouth daily.      FLUoxetine (PROZAC) 40 MG capsule Take 40 mg by mouth daily.     fluticasone (FLONASE) 50 MCG/ACT nasal spray USE 2 SPRAYS IN EACH NOSTRIL EVERY DAY AS NEEDED FOR ALLERGIES     galantamine (RAZADYNE) 12 MG tablet Take 12 mg by mouth 2 (two) times daily.      hydrocortisone cream 1 % Apply 1 application topically daily as needed for itching.     meclizine (ANTIVERT) 25 MG tablet Take 25 mg by mouth 3 (three) times daily as needed for dizziness.     Melatonin 5 MG TABS Take 1 tablet (5 mg total) by mouth at bedtime. 30 tablet 0   memantine (NAMENDA) 10 MG tablet Take 10 mg by mouth 2 (two) times daily.     metoprolol tartrate (LOPRESSOR) 25 MG tablet Take by mouth.     OVER THE COUNTER MEDICATION Place 0.75 mLs under the tongue at bedtime.     Polyethylene Glycol 3350 (PEG 3350) 17 GM/SCOOP POWD Take 17 g by mouth daily.     sodium chloride (OCEAN) 0.65 % SOLN nasal spray Place 2 sprays into both nostrils 2 (two) times daily as needed for congestion.     traMADol (ULTRAM) 50 MG tablet Take 50 mg by mouth every 6 (six) hours as needed for moderate pain.      triamterene-hydrochlorothiazide (DYAZIDE) 37.5-25 MG capsule Take 1 capsule by mouth every morning.      Pertinent medications related to GI  and procedure were reviewed by me with the patient prior to the procedure   Current Facility-Administered Medications:    0.9 %  sodium chloride infusion, , Intravenous, Continuous, [name], DO      Allergies  Allergen Reactions   Clarithromycin Other (See Comments)    Terrible taste and smell   Allergies were reviewed by me prior to the procedure  Objective   Body mass index is 27.82 kg/m. Vitals:   11/15/22 1222  BP: (!) 149/84  Pulse: (!) 58  Resp: 20  Temp: (!) 97.1 F (36.2 C)  TempSrc: Temporal  SpO2: 99%  Weight: 79.4 kg  Height: 5' 6.5" (1.689 m)     Physical Exam Vitals and nursing note reviewed.  Constitutional:      General: He is not in acute distress.    Appearance: Normal appearance. He is not ill-appearing, toxic-appearing or  diaphoretic.  HENT:     Head: Normocephalic and atraumatic.     Nose: Nose normal.     Mouth/Throat:     Mouth: Mucous membranes are moist.     Pharynx: Oropharynx is clear.  Eyes:     General: No scleral icterus.    Extraocular Movements: Extraocular movements intact.  Cardiovascular:     Rate and Rhythm: Regular rhythm. Bradycardia present.     Heart sounds: Normal heart sounds. No murmur heard.    No friction rub. No gallop.  Pulmonary:     Effort: Pulmonary effort is normal. No respiratory distress.     Breath sounds: Normal breath sounds. No wheezing, rhonchi or rales.  Abdominal:     General: Bowel sounds are normal. There is no distension.     Palpations: Abdomen is soft.     Tenderness: There is no abdominal tenderness. There is no guarding or rebound.  Musculoskeletal:     Cervical back: Neck supple.     Right lower leg: No edema.     Left lower leg: No edema.  Skin:    General: Skin is warm and dry.     Coloration: Skin is not jaundiced or pale.  Neurological:     Mental Status: He is alert and oriented to person, place, and time. Mental status is at baseline.  Psychiatric:        Mood and  Affect: Mood normal.        Behavior: Behavior normal.        Thought Content: Thought content normal.        Judgment: Judgment normal.      Assessment:  Marvin Jefferson is a 82 y.o. male  who presents today for Colonoscopy for Surveillance-personal history of colon polyps .  Plan:  Colonoscopy with possible intervention today  Colonoscopy with possible biopsy, control of bleeding, polypectomy, and interventions as necessary has been discussed with the patient/patient representative. Informed consent was obtained from the patient/patient representative after explaining the indication, nature, and risks of the procedure including but not limited to death, bleeding, perforation, missed neoplasm/lesions, cardiorespiratory compromise, and reaction to medications. Opportunity for questions was given and appropriate answers were provided. Patient/patient representative has verbalized understanding is amenable to undergoing the procedure.   [name], DO  [facility] Gastroenterology  Portions of the record may have been created with voice recognition software. Occasional wrong-word or 'sound-a-like' substitutions may have occurred due to the inherent limitations of voice recognition software.  Read the chart carefully and recognize, using context, where substitutions may have occurred.

## 2022-11-15 NOTE — Interval H&P Note (Signed)
History and Physical Interval Note: Preprocedure H&P from 11/15/22  was reviewed and there was no interval change after seeing and examining the patient.  Written consent was obtained from the patient after discussion of risks, benefits, and alternatives. Patient has consented to proceed with Colonoscopy with possible intervention   11/15/2022 12:34 PM  Marvin Jefferson  has presented today for surgery, with the diagnosis of Z86.010  - History of colon polyps.  The various methods of treatment have been discussed with the patient and family. After consideration of risks, benefits and other options for treatment, the patient has consented to  Procedure(s): COLONOSCOPY WITH PROPOFOL (N/A) as a surgical intervention.  The patient's history has been reviewed, patient examined, no change in status, stable for surgery.  I have reviewed the patient's chart and labs.  Questions were answered to the patient's satisfaction.     [name]

## 2022-11-15 NOTE — Anesthesia Preprocedure Evaluation (Signed)
Anesthesia Evaluation  Patient identified by MRN, date of birth, ID band Patient awake    Reviewed: Allergy & Precautions, NPO status , Patient's Chart, lab work & pertinent test results  Airway Mallampati: II       Dental  (+) Teeth Intact, Dental Advidsory Given   Pulmonary neg shortness of breath, sleep apnea , former smoker   breath sounds clear to auscultation       Cardiovascular Exercise Tolerance: Good hypertension, Pt. on home beta blockers (-) angina + Past MI   Rhythm:Regular     Neuro/Psych   Anxiety Depression       GI/Hepatic negative GI ROS, Neg liver ROS,,,  Endo/Other  negative endocrine ROS    Renal/GU negative Renal ROS     Musculoskeletal negative musculoskeletal ROS (+)    Abdominal Normal abdominal exam  (+)   Peds negative pediatric ROS (+)  Hematology negative hematology ROS (+)   Anesthesia Other Findings   Reproductive/Obstetrics                             Anesthesia Physical Anesthesia Plan  ASA: 3  Anesthesia Plan: General   Post-op Pain Management: Minimal or no pain anticipated   Induction: Intravenous  PONV Risk Score and Plan: 0 and Propofol infusion, TIVA and Ondansetron  Airway Management Planned: Natural Airway and Nasal Cannula  Additional Equipment: None  Intra-op Plan:   Post-operative Plan:   Informed Consent: I have reviewed the patients History and Physical, chart, labs and discussed the procedure including the risks, benefits and alternatives for the proposed anesthesia with the patient or authorized representative who has indicated his/her understanding and acceptance.     Dental advisory given  Plan Discussed with: CRNA  Anesthesia Plan Comments: (Discussed risks of anesthesia with patient, including possibility of difficulty with spontaneous ventilation under anesthesia necessitating airway intervention, PONV, and rare risks  such as cardiac or respiratory or neurological events, and allergic reactions. Discussed the role of CRNA in patient's perioperative care. Patient understands.)        Anesthesia Quick Evaluation

## 2022-11-15 NOTE — Op Note (Signed)
[facility] Gastroenterology Patient Name: Marvin Jefferson Procedure Date: 11/15/2022 12:46 PM MRN: [id number] Account #: [id number] Date of Birth: 13-Mar-1941 Admit Type: Outpatient Age: 82 Room: [facility] ENDO ROOM 2 Gender: Male Note Status: Finalized Instrument Name: Colonscope [id number] Procedure:             Colonoscopy Indications:           High risk colon cancer surveillance: Personal history                         of colonic polyps Providers:             Rueben Bash, DO Referring MD:          Rusty Aus, MD (Referring MD) Medicines:             Monitored Anesthesia Care Complications:         No immediate complications. Estimated blood loss:                         Minimal. Procedure:             Pre-Anesthesia Assessment:                        - Prior to the procedure, a History and Physical was                         performed, and patient medications and allergies were                         reviewed. The patient is competent. The risks and                         benefits of the procedure and the sedation options and                         risks were discussed with the patient. All questions                         were answered and informed consent was obtained.                         Patient identification and proposed procedure were                         verified by the physician, the nurse, the anesthetist                         and the technician in the endoscopy suite. Mental                         Status Examination: alert and oriented. Airway                         Examination: normal oropharyngeal airway and neck                         mobility. Respiratory Examination: clear to  auscultation. CV Examination: RRR, no murmurs, no S3                         or S4. Prophylactic Antibiotics: The patient does not                         require prophylactic antibiotics. Prior                          Anticoagulants: The patient has taken no anticoagulant                         or antiplatelet agents. ASA Grade Assessment: III - A                         patient with severe systemic disease. After reviewing                         the risks and benefits, the patient was deemed in                         satisfactory condition to undergo the procedure. The                         anesthesia plan was to use monitored anesthesia care                         (MAC). Immediately prior to administration of                         medications, the patient was re-assessed for adequacy                         to receive sedatives. The heart rate, respiratory                         rate, oxygen saturations, blood pressure, adequacy of                         pulmonary ventilation, and response to care were                         monitored throughout the procedure. The physical                         status of the patient was re-assessed after the                         procedure.                        After obtaining informed consent, the colonoscope was                         passed under direct vision. Throughout the procedure,                         the patient's blood pressure, pulse, and oxygen  saturations were monitored continuously. The                         Colonoscope was introduced through the anus and                         advanced to the the cecum, identified by appendiceal                         orifice and ileocecal valve. The colonoscopy was                         performed without difficulty. The patient tolerated                         the procedure well. The quality of the bowel                         preparation was evaluated using the BBPS [facility] Bowel                         Preparation Scale) with scores of: Right Colon = 2                         (minor amount of residual staining, small fragments of                         stool  and/or opaque liquid, but mucosa seen well),                         Transverse Colon = 3 (entire mucosa seen well with no                         residual staining, small fragments of stool or opaque                         liquid) and Left Colon = 3 (entire mucosa seen well                         with no residual staining, small fragments of stool or                         opaque liquid). The total BBPS score equals 8. The                         quality of the bowel preparation was excellent. The                         ileocecal valve, appendiceal orifice, and rectum were                         photographed. Findings:      The perianal and digital rectal examinations were normal. Pertinent       negatives include normal sphincter tone.      Six sessile polyps were found in the descending colon (2), transverse       colon (1), ascending colon (1) and cecum (  2). The polyps were 1 to 2 mm       in size. These polyps were removed with a jumbo cold forceps. Resection       and retrieval were complete. Estimated blood loss was minimal.      Five sessile polyps were found in the rectum, transverse colon,       ascending colon and cecum. The polyps were 3 to 5 mm in size. These       polyps were removed with a cold snare. Resection and retrieval were       complete. Estimated blood loss was minimal.      Multiple small-mouthed diverticula were found in the left colon.       Estimated blood loss: none.      Non-bleeding internal hemorrhoids were found during retroflexion. The       hemorrhoids were Grade I (internal hemorrhoids that do not prolapse).       Estimated blood loss: none.      The exam was otherwise without abnormality on direct and retroflexion       views.      There was a small lipoma, in the sigmoid colon. Estimated blood loss:       none. Impression:            - Six 1 to 2 mm polyps in the descending colon, in the                         transverse colon, in the  ascending colon and in the                         cecum, removed with a jumbo cold forceps. Resected and                         retrieved.                        - Five 3 to 5 mm polyps in the rectum, in the                         transverse colon, in the ascending colon and in the                         cecum, removed with a cold snare. Resected and                         retrieved.                        - Diverticulosis in the left colon.                        - Non-bleeding internal hemorrhoids.                        - The examination was otherwise normal on direct and                         retroflexion views.                        - Small lipoma in the sigmoid colon.  Recommendation:        - Patient has a contact number available for                         emergencies. The signs and symptoms of potential                         delayed complications were discussed with the patient.                         Return to normal activities tomorrow. Written                         discharge instructions were provided to the patient.                        - Discharge patient to home.                        - Resume previous diet.                        - Continue present medications.                        - No aspirin, ibuprofen, naproxen, or other                         non-steroidal anti-inflammatory drugs for 5 days after                         polyp removal.                        - Await pathology results.                        - Repeat colonoscopy for surveillance based on                         pathology results.                        - Return to referring physician as previously                         scheduled.                        - The findings and recommendations were discussed with                         the patient. Procedure Code(s):     --- Professional ---                        [phone], Colonoscopy, flexible; with removal of                          tumor(s), polyp(s), or other lesion(s) by snare                         technique  42706, 3, Colonoscopy, flexible; with biopsy, single                         or multiple Diagnosis Code(s):     --- Professional ---                        Z86.010, Personal history of colonic polyps                        D12.4, Benign neoplasm of descending colon                        D12.8, Benign neoplasm of rectum                        D12.3, Benign neoplasm of transverse colon (hepatic                         flexure or splenic flexure)                        D12.2, Benign neoplasm of ascending colon                        D12.0, Benign neoplasm of cecum                        K64.0, First degree hemorrhoids                        K57.30, Diverticulosis of large intestine without                         perforation or abscess without bleeding CPT copyright 2022 American [employer]. All rights reserved. The codes documented in this report are preliminary and upon coder review may  be revised to meet current compliance requirements. Attending Participation:      I personally performed the entire procedure. Volney American, DO [name] DO, DO 11/15/2022 2:10:11 PM This report has been signed electronically. Number of Addenda: 0 Note Initiated On: 11/15/2022 12:46 PM Scope Withdrawal Time: 0 hours 28 minutes 53 seconds  Total Procedure Duration: 0 hours 36 minutes 58 seconds  Estimated Blood Loss:  Estimated blood loss was minimal.      [facility]

## 2022-11-15 NOTE — Transfer of Care (Signed)
Immediate Anesthesia Transfer of Care Note  Patient: Marvin Jefferson  Procedure(s) Performed: COLONOSCOPY WITH PROPOFOL  Patient Location: Endoscopy Unit  Anesthesia Type:General  Level of Consciousness: awake, alert , and oriented  Airway & Oxygen Therapy: Patient Spontanous Breathing and Patient connected to nasal cannula oxygen  Post-op Assessment: Report given to RN and Post -op Vital signs reviewed and stable  Post vital signs: Reviewed and stable  Last Vitals:  Vitals Value Taken Time  BP 95/63 11/15/22 1355  Temp 35.7 C 11/15/22 1355  Pulse 74 11/15/22 1355  Resp 16 11/15/22 1355  SpO2 99 % 11/15/22 1355    Last Pain:  Vitals:   11/15/22 1355  TempSrc: Temporal  PainSc: 0-No pain         Complications: No notable events documented.

## 2022-11-16 ENCOUNTER — Encounter: Payer: Self-pay | Admitting: Gastroenterology

## 2022-11-16 LAB — SURGICAL PATHOLOGY

## 2023-03-30 ENCOUNTER — Ambulatory Visit: Payer: Medicare PPO | Admitting: Urology

## 2023-03-30 VITALS — BP 132/85 | HR 73 | Ht 66.0 in | Wt 179.0 lb

## 2023-03-30 DIAGNOSIS — N400 Enlarged prostate without lower urinary tract symptoms: Secondary | ICD-10-CM

## 2023-03-30 DIAGNOSIS — N401 Enlarged prostate with lower urinary tract symptoms: Secondary | ICD-10-CM | POA: Diagnosis not present

## 2023-03-30 DIAGNOSIS — R339 Retention of urine, unspecified: Secondary | ICD-10-CM

## 2023-03-30 LAB — BLADDER SCAN AMB NON-IMAGING: Scan Result: 478

## 2023-03-30 MED ORDER — TAMSULOSIN HCL 0.4 MG PO CAPS: 0.4000 mg | ORAL_CAPSULE | Freq: Every day | ORAL | 11 refills | Status: DC

## 2023-03-30 NOTE — Progress Notes (Signed)
I,Marvin Jefferson,acting as a scribe for Marvin Scotland, MD.,have documented all relevant documentation on the behalf of Marvin Scotland, MD,as directed by  Marvin Scotland, MD while in the presence of Marvin Scotland, MD.  03/30/2023 2:31 PM   Marvin Jefferson 12/09/1940 [id number]  Referring provider: Danella Penton, MD [phone] [facility] [address] [facility] [place],  [place] [address]  Chief Complaint  Patient presents with   Follow-up    HPI: 82 year-old male with a personal history of BPH and incomplete bladder emptying, presents today for a routine annual follow-up.   Status post HoLep in 2017. He self-caths as needed. He continues to take Doxazosin and Flomax, but has never been able to confirm if he's taking both or one or the other.   His PVRs have been typically ranging in the 100 to 200 range but today is 478.  He was asked to void, was able to void an additional 5 cc but still with PVR greater than 500.  He had a PSA with his primary care on 04/06/2022 and it was 1.03. This year he says he's not taking either of Rapaflo or Flomax.  He mentions he thinks he is doing well this year. He no longer has nocturia or self-cathing. He does not feel full at all. He confirms that he is no longer taking both Rapaflo and Flomax.   He does have memory issues and has been trying to read more and stimulate his mind.  Results for orders placed or performed in visit on 03/30/23  BLADDER SCAN AMB NON-IMAGING  Result Value Ref Range   Scan Result 478 ml     IPSS     Row Name 03/30/23 1300         International Prostate Symptom Score   How often have you had the sensation of not emptying your bladder? Less than 1 in 5     How often have you had to urinate less than every two hours? Not at All     How often have you found you stopped and started again several times when you urinated? Less than half the time     How often have you found it difficult to postpone urination?  Less than half the time     How often have you had a weak urinary stream? Less than half the time     How often have you had to strain to start urination? Not at All     How many times did you typically get up at night to urinate? None     Total IPSS Score 7       Quality of Life due to urinary symptoms   If you were to spend the rest of your life with your urinary condition just the way it is now how would you feel about that? Delighted            Score:  1-7 Mild 8-19 Moderate 20-35 Severe   PMH: Past Medical History:  Diagnosis Date   Anxiety    Chronic back pain    DDD (degenerative disc disease), cervical    DDD (degenerative disc disease), lumbar    Dementia (HCC)    Depression    History of dysplastic nevus 12/23/2009   Right upper back 4.0 cm lat to spine   Hypertension    Meniere's disease    Pneumonia 2015   Sleep apnea    Umbilical hernia    UTI (urinary tract infection)  Surgical History: Past Surgical History:  Procedure Laterality Date   APPENDECTOMY     COLONOSCOPY WITH PROPOFOL N/A 05/23/2017   Procedure: COLONOSCOPY WITH PROPOFOL;  Surgeon: Christena Deem, MD;  Location: [facility] ENDOSCOPY;  Service: Endoscopy;  Laterality: N/A;   COLONOSCOPY WITH PROPOFOL N/A 11/15/2022   Procedure: COLONOSCOPY WITH PROPOFOL;  Surgeon: Jaynie Collins, DO;  Location: [facility] ENDOSCOPY;  Service: Gastroenterology;  Laterality: N/A;   CORONARY/GRAFT ACUTE MI REVASCULARIZATION N/A 04/19/2019   Procedure: Coronary/Graft Acute MI Revascularization;  Surgeon: Marcina Millard, MD;  Location: [facility] INVASIVE CV LAB;  Service: Cardiovascular;  Laterality: N/A;   ESOPHAGOGASTRODUODENOSCOPY (EGD) WITH PROPOFOL N/A 05/23/2017   Procedure: ESOPHAGOGASTRODUODENOSCOPY (EGD) WITH PROPOFOL;  Surgeon: Christena Deem, MD;  Location: [facility] ENDOSCOPY;  Service: Endoscopy;  Laterality: N/A;   GANGLION CYST EXCISION Right    wrist   HERNIA REPAIR     umbilical hernia    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 N/A 08/23/2016   Procedure: 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;  Surgeon: Marvin Scotland, MD;  Location: [facility] ORS;  Service: Urology;  Laterality: N/A;   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 N/A 12/06/2016   Procedure: 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;  Surgeon: Marvin Scotland, MD;  Location: [facility] ORS;  Service: Urology;  Laterality: N/A;   LEFT HEART CATH AND CORONARY ANGIOGRAPHY N/A 04/19/2019   Procedure: LEFT HEART CATH AND CORONARY ANGIOGRAPHY;  Surgeon: Marcina Millard, MD;  Location: [facility] INVASIVE CV LAB;  Service: Cardiovascular;  Laterality: N/A;    Home Medications:  Allergies as of 03/30/2023       Reactions   Clarithromycin Other (See Comments)   Terrible taste and smell        Medication List        Accurate as of March 30, 2023  2:31 PM. If you have any questions, ask your nurse or doctor.          STOP taking these medications    ALPRAZolam 0.25 MG tablet Commonly known as: XANAX   amLODipine 5 MG tablet Commonly known as: NORVASC   fluticasone 50 MCG/ACT nasal spray Commonly known as: FLONASE   hydrocortisone cream 1 %   PEG 3350 17 GM/SCOOP Powd   sodium chloride 0.65 % Soln nasal spray Commonly known as: OCEAN   traMADol 50 MG tablet Commonly known as: ULTRAM       TAKE these medications    acetaminophen 500 MG tablet Commonly known as: TYLENOL Take 1,000 mg by mouth every 6 (six) hours as needed for mild pain.   ARIPiprazole 2 MG tablet Commonly known as: ABILIFY Take by mouth.   aspirin 81 MG chewable tablet Chew 1 tablet (81 mg total) by mouth daily.   atorvastatin 80 MG tablet Commonly known as: LIPITOR Take 1 tablet (80 mg total) by mouth daily at 6 PM.   cyanocobalamin 500 MCG tablet Commonly known as: VITAMIN B12 Take by mouth.   doxazosin 4 MG tablet Commonly known as: CARDURA Take by mouth.    FLUoxetine 40 MG capsule Commonly known as: PROZAC Take 40 mg by mouth daily.   galantamine 12 MG tablet Commonly known as: RAZADYNE Take 12 mg by mouth 2 (two) times daily.   meclizine 25 MG tablet Commonly known as: ANTIVERT Take 25 mg by mouth 3 (three) times daily as needed for dizziness.   melatonin 5 MG Tabs Take 1 tablet (5 mg total) by mouth at bedtime.   memantine 10  MG tablet Commonly known as: NAMENDA Take 10 mg by mouth 2 (two) times daily.   metoprolol tartrate 25 MG tablet Commonly known as: LOPRESSOR Take by mouth.   multivitamin with minerals Tabs tablet Take 1 tablet by mouth daily.   OVER THE COUNTER MEDICATION Place 0.75 mLs under the tongue at bedtime.   tamsulosin 0.4 MG Caps capsule Commonly known as: FLOMAX Take 1 capsule (0.4 mg total) by mouth daily.   triamterene-hydrochlorothiazide 37.5-25 MG capsule Commonly known as: DYAZIDE Take 1 capsule by mouth every morning.   Vitamin D3 50 MCG (2000 UT) capsule Take 2,000 Units by mouth daily.   vitamin E 1000 UNIT capsule Take 1,000 Units by mouth daily.        Allergies:  Allergies  Allergen Reactions   Clarithromycin Other (See Comments)    Terrible taste and smell    Family History: Family History  Problem Relation Age of Onset   Prostate cancer Father    Hypertension Father    Stroke Father    Hypertension Mother    Dementia Mother    Bladder Cancer Neg Hx    Kidney cancer Neg Hx     Social History:  reports that he quit smoking about 60 years ago. His smoking use included cigarettes. He has never used smokeless tobacco. He reports that he does not drink alcohol and does not use drugs.   Physical Exam: BP 132/85   Pulse 73   Ht 5\' 6"  (1.676 m)   Wt 179 lb (81.2 kg)   BMI 28.89 kg/m   Constitutional:  Alert and oriented, No acute distress. HEENT: [place] AT, moist mucus membranes.  Trachea midline, no masses. Neurologic: Grossly intact, no focal deficits, moving all 4  extremities. Psychiatric: Normal mood and affect.  Assessment & Plan:    BPH with incomplete bladder emptying  - His PVR is markedly more elevated today than on previous occasions.  - No longer on alpha blocker, however it's likely he may benefit from this.  -Resume flomax  - He doesn't have any sequela. Basically, chronic retention, incomplete bladder emptying, hasn't had any infections. His creatinine stable. Likely has some sensory impairment issues as it relates to the bladder.   - Encouraged him to try to empty as best as possible. Will have him return in a month or two to recheck his PVR to see if he says there's any improvement. If not, we may have him start resuming CIC one or two times per day, depending on the result.   Return in about 1 year (around 03/29/2024) for IPSS, PVR.   [facility] [address] [place], [place] [address] [phone]

## 2023-03-31 ENCOUNTER — Encounter: Payer: Self-pay | Admitting: Urology

## 2023-03-31 MED ORDER — TAMSULOSIN HCL 0.4 MG PO CAPS: 0.4000 mg | ORAL_CAPSULE | Freq: Every day | ORAL | 11 refills | Status: AC

## 2023-05-30 ENCOUNTER — Encounter: Payer: Self-pay | Admitting: Physician Assistant

## 2023-05-30 ENCOUNTER — Ambulatory Visit: Payer: Medicare PPO | Admitting: Physician Assistant

## 2023-05-30 VITALS — BP 135/80 | HR 71 | Wt 177.0 lb

## 2023-05-30 DIAGNOSIS — N401 Enlarged prostate with lower urinary tract symptoms: Secondary | ICD-10-CM | POA: Diagnosis not present

## 2023-05-30 DIAGNOSIS — R339 Retention of urine, unspecified: Secondary | ICD-10-CM

## 2023-05-30 LAB — BLADDER SCAN AMB NON-IMAGING

## 2023-05-30 NOTE — Progress Notes (Signed)
05/30/2023 1:32 PM   Marvin Jefferson 09-23-1941 [id number]  CC: Chief Complaint  Patient presents with   Follow-up   HPI: Marvin Jefferson is a 82 y.o. 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   Today he reports he is tolerating Flomax well with no acute concerns.  He denies any bladder discomfort or UTI symptoms.  He had an annual physical 11 days ago, creatinine stable at 1.2.  PVR >371mL.  PMH: Past Medical History:  Diagnosis Date   Anxiety    Chronic back pain    DDD (degenerative disc disease), cervical    DDD (degenerative disc disease), lumbar    Dementia (HCC)    Depression    History of dysplastic nevus 12/23/2009   Right upper back 4.0 cm lat to spine   Hypertension    Meniere's disease    Pneumonia 2015   Sleep apnea    Umbilical hernia    UTI (urinary tract infection)     Surgical History: Past Surgical History:  Procedure Laterality Date   APPENDECTOMY     COLONOSCOPY WITH PROPOFOL N/A 05/23/2017   Procedure: COLONOSCOPY WITH PROPOFOL;  Surgeon: Christena Deem, MD;  Location: [facility] ENDOSCOPY;  Service: Endoscopy;  Laterality: N/A;   COLONOSCOPY WITH PROPOFOL N/A 11/15/2022   Procedure: COLONOSCOPY WITH PROPOFOL;  Surgeon: Jaynie Collins, DO;  Location: [facility] ENDOSCOPY;  Service: Gastroenterology;  Laterality: N/A;   CORONARY/GRAFT ACUTE MI REVASCULARIZATION N/A 04/19/2019   Procedure: Coronary/Graft Acute MI Revascularization;  Surgeon: Marcina Millard, MD;  Location: [facility] INVASIVE CV LAB;  Service: Cardiovascular;  Laterality: N/A;   ESOPHAGOGASTRODUODENOSCOPY (EGD) WITH PROPOFOL N/A 05/23/2017   Procedure: ESOPHAGOGASTRODUODENOSCOPY (EGD) WITH PROPOFOL;  Surgeon: Christena Deem, MD;  Location: [facility] ENDOSCOPY;  Service: Endoscopy;  Laterality: N/A;   GANGLION CYST EXCISION Right    wrist   HERNIA REPAIR      umbilical hernia   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 N/A 08/23/2016   Procedure: 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;  Surgeon: [name], MD;  Location: [facility] ORS;  Service: Urology;  Laterality: N/A;   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 N/A 12/06/2016   Procedure: 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;  Surgeon: [name], MD;  Location: [facility] ORS;  Service: Urology;  Laterality: N/A;   LEFT HEART CATH AND CORONARY ANGIOGRAPHY N/A 04/19/2019   Procedure: LEFT HEART CATH AND CORONARY ANGIOGRAPHY;  Surgeon: Marcina Millard, MD;  Location: [facility] INVASIVE CV LAB;  Service: Cardiovascular;  Laterality: N/A;    Home Medications:  Allergies as of 05/30/2023       Reactions   Clarithromycin Other (See Comments)   Terrible taste and smell        Medication List        Accurate as of May 30, 2023  1:32 PM. If you have any questions, ask your nurse or doctor.          acetaminophen 500 MG tablet Commonly known as: TYLENOL Take 1,000 mg by mouth every 6 (six) hours as needed for mild pain.   ARIPiprazole 2 MG tablet Commonly known as: ABILIFY Take by mouth.   aspirin 81 MG chewable tablet Chew 1 tablet (81 mg total) by mouth daily.   atorvastatin 80 MG tablet Commonly known as: LIPITOR Take 1 tablet (80 mg total) by mouth daily at 6 PM.  cyanocobalamin 500 MCG tablet Commonly known as: VITAMIN B12 Take by mouth.   doxazosin 4 MG tablet Commonly known as: CARDURA Take by mouth.   FLUoxetine 40 MG capsule Commonly known as: PROZAC Take 40 mg by mouth daily.   galantamine 12 MG tablet Commonly known as: RAZADYNE Take 12 mg by mouth 2 (two) times daily.   meclizine 25 MG tablet Commonly known as: ANTIVERT Take 25 mg by mouth 3 (three) times daily as needed for dizziness.   melatonin 5 MG Tabs Take 1 tablet (5 mg total) by mouth at bedtime.   memantine 10 MG  tablet Commonly known as: NAMENDA Take 10 mg by mouth 2 (two) times daily.   metoprolol tartrate 25 MG tablet Commonly known as: LOPRESSOR Take by mouth.   multivitamin with minerals Tabs tablet Take 1 tablet by mouth daily.   OVER THE COUNTER MEDICATION Place 0.75 mLs under the tongue at bedtime.   tamsulosin 0.4 MG Caps capsule Commonly known as: FLOMAX Take 1 capsule (0.4 mg total) by mouth daily.   triamterene-hydrochlorothiazide 37.5-25 MG capsule Commonly known as: DYAZIDE Take 1 capsule by mouth every morning.   Vitamin D3 50 MCG (2000 UT) capsule Take 2,000 Units by mouth daily.   vitamin E 1000 UNIT capsule Take 1,000 Units by mouth daily.        Allergies:  Allergies  Allergen Reactions   Clarithromycin Other (See Comments)    Terrible taste and smell    Family History: Family History  Problem Relation Age of Onset   Prostate cancer Father    Hypertension Father    Stroke Father    Hypertension Mother    Dementia Mother    Bladder Cancer Neg Hx    Kidney cancer Neg Hx     Social History:   reports that he quit smoking about 60 years ago. His smoking use included cigarettes. He has never used smokeless tobacco. He reports that he does not drink alcohol and does not use drugs.  Physical Exam: BP 135/80   Pulse 71   Wt 177 lb (80.3 kg)   BMI 28.57 kg/m   Constitutional:  Alert and oriented, no acute distress, nontoxic appearing HEENT: [place], AT Cardiovascular: No clubbing, cyanosis, or edema Respiratory: Normal respiratory effort, no increased work of breathing Skin: No rashes, bruises or suspicious lesions Neurologic: Grossly intact, no focal deficits, moving all 4 extremities Psychiatric: Normal mood and affect  Laboratory Data: Results for orders placed or performed in visit on 05/30/23  Bladder Scan (Post Void Residual) in office  Result Value Ref Range   Scan Result >313ml    Assessment & Plan:   1. Benign prostatic hyperplasia with  incomplete bladder emptying PVR is stably elevated today, possible slight improvement since resuming Flomax.  Since he is tolerating this well, will continue.  He remains asymptomatic with stable renal function.  We discussed consideration of resuming CIC, however he is not particularly excited about doing this, which is reasonable.  In mutual decision making, we elected to defer CIC for now.  We discussed that we may resume it in the future if he develops frequent UTIs, bladder discomfort, or declining renal function.  He is in agreement with this plan. - Bladder Scan (Post Void Residual) in office  Return in about 9 months (around 02/27/2024) for Annual follow-up with Dr. Apolinar Junes.  Carman Ching, PA-C  [facility] [place] [address], [address] [place], [place] [address] [phone]

## 2023-06-02 ENCOUNTER — Ambulatory Visit: Payer: Medicare PPO | Admitting: Dermatology

## 2023-06-02 VITALS — BP 136/82 | HR 69

## 2023-06-02 DIAGNOSIS — L570 Actinic keratosis: Secondary | ICD-10-CM | POA: Diagnosis not present

## 2023-06-02 DIAGNOSIS — D229 Melanocytic nevi, unspecified: Secondary | ICD-10-CM

## 2023-06-02 DIAGNOSIS — D692 Other nonthrombocytopenic purpura: Secondary | ICD-10-CM

## 2023-06-02 DIAGNOSIS — D1801 Hemangioma of skin and subcutaneous tissue: Secondary | ICD-10-CM

## 2023-06-02 DIAGNOSIS — W908XXA Exposure to other nonionizing radiation, initial encounter: Secondary | ICD-10-CM

## 2023-06-02 DIAGNOSIS — L578 Other skin changes due to chronic exposure to nonionizing radiation: Secondary | ICD-10-CM | POA: Diagnosis not present

## 2023-06-02 DIAGNOSIS — Z1283 Encounter for screening for malignant neoplasm of skin: Secondary | ICD-10-CM

## 2023-06-02 DIAGNOSIS — L814 Other melanin hyperpigmentation: Secondary | ICD-10-CM

## 2023-06-02 DIAGNOSIS — Z86018 Personal history of other benign neoplasm: Secondary | ICD-10-CM

## 2023-06-02 DIAGNOSIS — L821 Other seborrheic keratosis: Secondary | ICD-10-CM

## 2023-06-02 NOTE — Patient Instructions (Addendum)

## 2023-06-02 NOTE — Progress Notes (Signed)
Follow-Up Visit   Subjective  Marvin Jefferson is a 82 y.o. male who presents for the following: Skin Cancer Screening and Full Body Skin Exam, hx of Dysplastic nevus.   The patient presents for Total-Body Skin Exam (TBSE) for skin cancer screening and mole check. The patient has spots, moles and lesions to be evaluated, some may be new or changing and the patient may have concern these could be cancer.    The following portions of the chart were reviewed this encounter and updated as appropriate: medications, allergies, medical history  Review of Systems:  No other skin or systemic complaints except as noted in HPI or Assessment and Plan.  Objective  Well appearing patient in no apparent distress; mood and affect are within normal limits.  A full examination was performed including scalp, head, eyes, ears, nose, lips, neck, chest, axillae, abdomen, back, buttocks, bilateral upper extremities, bilateral lower extremities, hands, feet, fingers, toes, fingernails, and toenails. All findings within normal limits unless otherwise noted below.   Relevant physical exam findings are noted in the Assessment and Plan.  right forehead x 3, nasal tip x 1 (4) Erythematous thin papules/macules with gritty scale.     Assessment & Plan   SKIN CANCER SCREENING PERFORMED TODAY.  ACTINIC DAMAGE - Chronic condition, secondary to cumulative UV/sun exposure - diffuse scaly erythematous macules with underlying dyspigmentation - Recommend daily broad spectrum sunscreen SPF 30+ to sun-exposed areas, reapply every 2 hours as needed.  - Staying in the shade or wearing long sleeves, sun glasses (UVA+UVB protection) and wide brim hats (4-inch brim around the entire circumference of the hat) are also recommended for sun protection.  - Call for new or changing lesions.  LENTIGINES, SEBORRHEIC KERATOSES, HEMANGIOMAS - Benign normal skin lesions - Benign-appearing - Call for any changes  MELANOCYTIC NEVI -  Tan-brown and/or pink-flesh-colored symmetric macules and papules - Benign appearing on exam today - Observation - Call clinic for new or changing moles - Recommend daily use of broad spectrum spf 30+ sunscreen to sun-exposed areas.    Purpura - Chronic; persistent and recurrent.  Treatable, but not curable. - Violaceous macules and patches - Benign - Related to trauma, age, sun damage and/or use of blood thinners, chronic use of topical and/or oral steroids - Observe - Can use OTC arnica containing moisturizer such as Dermend Bruise Formula if desired - Call for worsening or other concerns    HISTORY OF DYSPLASTIC NEVUS No evidence of recurrence today Recommend regular full body skin exams Recommend daily broad spectrum sunscreen SPF 30+ to sun-exposed areas, reapply every 2 hours as needed.  Call if any new or changing lesions are noted between office visits    AK (actinic keratosis) (4) right forehead x 3, nasal tip x 1  Destruction of lesion - right forehead x 3, nasal tip x 1 (4) Complexity: simple   Destruction method: cryotherapy   Informed consent: discussed and consent obtained   Timeout:  patient name, date of birth, surgical site, and procedure verified Lesion destroyed using liquid nitrogen: Yes   Region frozen until ice ball extended beyond lesion: Yes   Outcome: patient tolerated procedure well with no complications   Post-procedure details: wound care instructions given     Return in about 1 year (around 06/01/2024) for TBSE, hx of Dysplastic nevus .  Cipriano Bunker, CMA, am acting as scribe for Armida Sans, MD .   Documentation: I have reviewed the above documentation for accuracy and completeness,  and I agree with the above.  Armida Sans, MD

## 2023-06-12 ENCOUNTER — Encounter: Payer: Self-pay | Admitting: Dermatology

## 2023-06-14 ENCOUNTER — Ambulatory Visit: Payer: Medicare PPO | Attending: Neurology | Admitting: Speech Pathology

## 2023-06-14 ENCOUNTER — Other Ambulatory Visit: Payer: Self-pay | Admitting: Student

## 2023-06-14 DIAGNOSIS — R41841 Cognitive communication deficit: Secondary | ICD-10-CM | POA: Insufficient documentation

## 2023-06-14 DIAGNOSIS — G3184 Mild cognitive impairment, so stated: Secondary | ICD-10-CM | POA: Insufficient documentation

## 2023-06-14 DIAGNOSIS — H8103 Meniere's disease, bilateral: Secondary | ICD-10-CM

## 2023-06-14 NOTE — Therapy (Unsigned)
OUTPATIENT SPEECH LANGUAGE PATHOLOGY  COGNITION EVALUATION ONLY   Patient Name: Marvin Jefferson MRN: [id number] DOB:Feb 06, 1941, 82 y.o., male Today's Date: 06/14/2023  PCP: Bethann Punches, MD REFERRING PROVIDER: Cristopher Peru, MD   End of Session - 06/14/23 1348     Visit Number 1    Number of Visits --    Date for SLP Re-Evaluation --    Authorization Type [employer] PPO    Progress Note Due on Visit --    SLP Start Time 1400    SLP Stop Time  1445    SLP Time Calculation (min) 45 min    Activity Tolerance Patient tolerated treatment well             Past Medical History:  Diagnosis Date   Anxiety    Chronic back pain    DDD (degenerative disc disease), cervical    DDD (degenerative disc disease), lumbar    Dementia (HCC)    Depression    History of dysplastic nevus 12/23/2009   Right upper back 4.0 cm lat to spine   Hypertension    Meniere's disease    Pneumonia 2015   Sleep apnea    Umbilical hernia    UTI (urinary tract infection)    Past Surgical History:  Procedure Laterality Date   APPENDECTOMY     COLONOSCOPY WITH PROPOFOL N/A 05/23/2017   Procedure: COLONOSCOPY WITH PROPOFOL;  Surgeon: Christena Deem, MD;  Location: [facility] ENDOSCOPY;  Service: Endoscopy;  Laterality: N/A;   COLONOSCOPY WITH PROPOFOL N/A 11/15/2022   Procedure: COLONOSCOPY WITH PROPOFOL;  Surgeon: Jaynie Collins, DO;  Location: [facility] ENDOSCOPY;  Service: Gastroenterology;  Laterality: N/A;   CORONARY/GRAFT ACUTE MI REVASCULARIZATION N/A 04/19/2019   Procedure: Coronary/Graft Acute MI Revascularization;  Surgeon: Marcina Millard, MD;  Location: [facility] INVASIVE CV LAB;  Service: Cardiovascular;  Laterality: N/A;   ESOPHAGOGASTRODUODENOSCOPY (EGD) WITH PROPOFOL N/A 05/23/2017   Procedure: ESOPHAGOGASTRODUODENOSCOPY (EGD) WITH PROPOFOL;  Surgeon: Christena Deem, MD;  Location: [facility] ENDOSCOPY;  Service: Endoscopy;  Laterality: N/A;   GANGLION CYST EXCISION Right    wrist    HERNIA REPAIR     umbilical hernia   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 N/A 08/23/2016   Procedure: 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;  Surgeon: [name], MD;  Location: [facility] ORS;  Service: Urology;  Laterality: N/A;   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 N/A 12/06/2016   Procedure: 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;  Surgeon: [name], MD;  Location: [facility] ORS;  Service: Urology;  Laterality: N/A;   LEFT HEART CATH AND CORONARY ANGIOGRAPHY N/A 04/19/2019   Procedure: LEFT HEART CATH AND CORONARY ANGIOGRAPHY;  Surgeon: Marcina Millard, MD;  Location: [facility] INVASIVE CV LAB;  Service: Cardiovascular;  Laterality: N/A;   Patient Active Problem List   Diagnosis Date Noted   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   STEMI (ST elevation myocardial infarction) (HCC) 04/19/2019   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   Benign essential hypertension 09/16/2016   BPH with obstruction/lower urinary tract symptoms 09/16/2016   Medicare annual wellness visit, initial 09/16/2016   Lumbar disc disease 02/28/2014   Meniere's disease 02/28/2014   AR (allergic rhinitis) 02/27/2014   Cervical disc disease 02/27/2014   HTN (hypertension) 02/27/2014   Late onset Alzheimer's disease without behavioral disturbance (HCC) 02/27/2014   OA (osteoarthritis) 02/27/2014    ONSET DATE: ~ 2008; date of referral 06/13/2023   REFERRING DIAG: G31.84 (ICD-10-CM) -  Mild cognitive impairment    THERAPY DIAG:  Cognitive communication deficit  Mild cognitive impairment  Rationale for Evaluation and Treatment Rehabilitation  SUBJECTIVE:   SUBJECTIVE STATEMENT: Pt pleasant, describes use of compensatory strategies such as making lists Pt accompanied by: self  PERTINENT HISTORY: Pt is a 82 year old male with medical hisotry of mild cognitive impairment with significant amnestic component  strongly suggestive of late onset Alzheimer's diesease without behavioral disturbance, restless leg syndrome, OSA, Meniere's Disease. Per chart review, very slowly progressive mild Alzheimer's Disease since around 2008.    DIAGNOSTIC FINDINGS: SLUMS 06/13/2023 20/30   PAIN:  Are you having pain? No   FALLS: Has patient fallen in last 6 months?  No  LIVING ENVIRONMENT: Lives with: lives with their spouse Lives in: House/apartment  PLOF:  Level of assistance: Independent with ADLs, Independent with IADLs Employment: Retired   PATIENT GOALS   to assess cognitive communication abilities  OBJECTIVE:   COGNITIVE COMMUNICATION Overall cognitive status: Within functional limits for tasks assessed Areas of impairment:  Memory: Impaired: Short term Functional Impairments: needs to use lists to help with recall of activities, lists for grocery store  AUDITORY COMPREHENSION  Overall auditory comprehension: Appears intact YES/NO questions: Appears intact Following directions: Appears intact Conversation: Complex  READING COMPREHENSION: Intact  EXPRESSION: verbal  VERBAL EXPRESSION:   Overall verbal expression: Appears intact Level of generative/spontaneous verbalization: conversation Automatic speech: name: intact and social response: intact  Repetition: Appears intact Naming: Responsive: 76-100%, Confrontation: 76-100%, Convergent: 76-100%, and Divergent: 76-100% Pragmatics: Appears intact Non-verbal means of communication: N/A  WRITTEN EXPRESSION: Dominant hand: right Written expression: Appears intact  ORAL MOTOR EXAMINATION Facial : WFL Lingual: WFL Velum: WFL Mandible: WFL Cough: WFL Voice: WFL  MOTOR SPEECH: Overall motor speech: Appears intact Respiration: diaphragmatic/abdominal breathing Phonation: normal Resonance: WFL Articulation: Appears intact Intelligibility: Intelligible Motor planning: Appears intact   STANDARDIZED  ASSESSMENTS: Addenbrooke's Cognitive Examination - ACE III The Addenbrooke's Cognitive Examination-III (ACE-III) is a brief cognitive test that assesses five cognitive domains. The total score is 100 with higher scores indicating better cognitive functioning. Cut off scores of 88 and 82 are recommended for suspicion of dementia (88 has sensitivity of 1.00 and specificity of 0.96, 82 has sensitivity of 0.93 and specificity of 1.00). American Version A  Attention 16/18  Memory 17/26  Fluency 13/14  Language 25/26  Visuospatial 16/16  TOTAL ACE- III Score 87/100      PATIENT EDUCATION: Education details: results of this assessment, compensatory strategies for short term memory Person educated: Patient Education method: Explanation Education comprehension: verbalized understanding     ASSESSMENT:  CLINICAL IMPRESSION: Patient is a 82 y.o. male who was seen today for a formal cognitive communication evaluation. Pt presents with overall good cognitive abilities as evidenced by his performance on the ACE 3. He reports some difficulty with short-term memory loss ("I don't have any short term memory, I can't remember things from 5 minutes ago ") Despite this, he reports that he is still driving, grocery store shopping, about to navigate his phone, computer, reads, goes to the gym, his wife intermittently checks his medication organization and uses lists for daily tasks as well as a grocery list. He continues to eagerly engage in hobbies. Education provided on results of pt's assessment as well as some suggestions on ways to prevent further short term memory loss. Pt receptive and able to demonstrate learning. At this time, skilled ST intervention doesn't appear indicated. Pt in agreement.    PLAN:  Skilled ST intervention is not indicated at this time.    Marvena Tally B. Dreama Saa, M.S., CCC-SLP, Tree surgeon Certified Brain Injury Specialist [facility]  [facility] Rehabilitation Services Office [phone] Ascom [phone] Fax [phone]

## 2023-06-15 ENCOUNTER — Other Ambulatory Visit: Payer: Self-pay | Admitting: Student

## 2023-06-15 DIAGNOSIS — F0280 Dementia in other diseases classified elsewhere without behavioral disturbance: Secondary | ICD-10-CM

## 2023-06-16 ENCOUNTER — Ambulatory Visit: Payer: Medicare PPO | Admitting: Speech Pathology

## 2023-06-21 ENCOUNTER — Ambulatory Visit
Admission: RE | Admit: 2023-06-21 | Discharge: 2023-06-21 | Disposition: A | Discharge status: Home / Self Care | Payer: Medicare PPO | Source: Ambulatory Visit | Attending: Student | Admitting: Student

## 2023-06-21 ENCOUNTER — Ambulatory Visit: Payer: Medicare PPO | Admitting: Speech Pathology

## 2023-06-21 DIAGNOSIS — H8103 Meniere's disease, bilateral: Secondary | ICD-10-CM | POA: Diagnosis present

## 2023-06-22 ENCOUNTER — Ambulatory Visit
Admission: RE | Admit: 2023-06-22 | Discharge: 2023-06-22 | Disposition: A | Discharge status: Home / Self Care | Payer: Medicare PPO | Source: Ambulatory Visit | Attending: Student | Admitting: Student

## 2023-06-22 DIAGNOSIS — F0280 Dementia in other diseases classified elsewhere without behavioral disturbance: Secondary | ICD-10-CM | POA: Insufficient documentation

## 2023-06-22 DIAGNOSIS — G301 Alzheimer's disease with late onset: Secondary | ICD-10-CM | POA: Diagnosis present

## 2023-06-23 ENCOUNTER — Encounter: Payer: Medicare PPO | Admitting: Speech Pathology

## 2023-06-29 ENCOUNTER — Encounter: Payer: Medicare PPO | Admitting: Speech Pathology

## 2023-06-30 ENCOUNTER — Encounter: Payer: Medicare PPO | Admitting: Speech Pathology

## 2023-07-06 ENCOUNTER — Encounter: Payer: Medicare PPO | Admitting: Speech Pathology

## 2023-07-07 ENCOUNTER — Encounter: Payer: Medicare PPO | Admitting: Speech Pathology

## 2023-07-11 ENCOUNTER — Encounter: Payer: Medicare PPO | Admitting: Speech Pathology

## 2023-07-13 ENCOUNTER — Encounter: Payer: Medicare PPO | Admitting: Speech Pathology

## 2023-07-14 ENCOUNTER — Ambulatory Visit: Payer: Medicare PPO | Admitting: Dermatology

## 2023-07-18 ENCOUNTER — Encounter: Payer: Medicare PPO | Admitting: Speech Pathology

## 2023-07-20 ENCOUNTER — Encounter: Payer: Medicare PPO | Admitting: Speech Pathology

## 2023-07-25 ENCOUNTER — Encounter: Payer: Medicare PPO | Admitting: Speech Pathology

## 2023-07-27 ENCOUNTER — Encounter: Payer: Medicare PPO | Admitting: Speech Pathology

## 2023-08-01 ENCOUNTER — Encounter: Payer: Medicare PPO | Admitting: Speech Pathology

## 2023-08-03 ENCOUNTER — Encounter: Payer: Medicare PPO | Admitting: Speech Pathology

## 2023-08-08 ENCOUNTER — Encounter: Payer: Medicare PPO | Admitting: Speech Pathology

## 2023-08-10 ENCOUNTER — Encounter: Payer: Medicare PPO | Admitting: Speech Pathology

## 2023-08-15 ENCOUNTER — Encounter: Payer: Medicare PPO | Admitting: Speech Pathology

## 2023-08-17 ENCOUNTER — Encounter: Payer: Medicare PPO | Admitting: Speech Pathology

## 2023-08-22 ENCOUNTER — Encounter: Payer: Medicare PPO | Admitting: Speech Pathology

## 2023-08-24 ENCOUNTER — Encounter: Payer: Medicare PPO | Admitting: Speech Pathology

## 2023-08-29 ENCOUNTER — Encounter: Payer: Medicare PPO | Admitting: Speech Pathology

## 2023-08-31 ENCOUNTER — Encounter: Payer: Medicare PPO | Admitting: Speech Pathology

## 2023-11-08 ENCOUNTER — Other Ambulatory Visit: Payer: Self-pay

## 2023-11-08 ENCOUNTER — Emergency Department
Admission: EM | Admit: 2023-11-08 | Discharge: 2023-11-08 | Disposition: A | Discharge status: Home / Self Care | Payer: Medicare PPO | Attending: Emergency Medicine | Admitting: Emergency Medicine

## 2023-11-08 DIAGNOSIS — I10 Essential (primary) hypertension: Secondary | ICD-10-CM | POA: Diagnosis not present

## 2023-11-08 DIAGNOSIS — R040 Epistaxis: Secondary | ICD-10-CM | POA: Insufficient documentation

## 2023-11-08 LAB — CBC WITH DIFFERENTIAL/PLATELET
Abs Immature Granulocytes: 0.04 10*3/uL (ref 0.00–0.07)
Basophils Absolute: 0.1 10*3/uL (ref 0.0–0.1)
Basophils Relative: 1 %
Eosinophils Absolute: 0.2 10*3/uL (ref 0.0–0.5)
Eosinophils Relative: 2 %
HCT: 42.4 % (ref 39.0–52.0)
Hemoglobin: 14.7 g/dL (ref 13.0–17.0)
Immature Granulocytes: 0 %
Lymphocytes Relative: 20 %
Lymphs Abs: 2.1 10*3/uL (ref 0.7–4.0)
MCH: 32.4 pg (ref 26.0–34.0)
MCHC: 34.7 g/dL (ref 30.0–36.0)
MCV: 93.4 fL (ref 80.0–100.0)
Monocytes Absolute: 1 10*3/uL (ref 0.1–1.0)
Monocytes Relative: 10 %
Neutro Abs: 7.1 10*3/uL (ref 1.7–7.7)
Neutrophils Relative %: 67 %
Platelets: 238 10*3/uL (ref 150–400)
RBC: 4.54 MIL/uL (ref 4.22–5.81)
RDW: 12.7 % (ref 11.5–15.5)
WBC: 10.5 10*3/uL (ref 4.0–10.5)
nRBC: 0 % (ref 0.0–0.2)

## 2023-11-08 MED ORDER — TRANEXAMIC ACID FOR EPISTAXIS
500.0000 mg | Freq: Once | TOPICAL | Status: DC
  Filled 2023-11-08: qty 5

## 2023-11-08 MED ORDER — SILVER NITRATE-POT NITRATE 75-25 % EX MISC
CUTANEOUS | Status: AC
  Filled 2023-11-08: qty 10

## 2023-11-08 MED ORDER — OXYMETAZOLINE HCL 0.05 % NA SOLN
1.0000 | Freq: Once | NASAL | Status: AC
Start: 2023-11-08 — End: 2023-11-08
  Administered 2023-11-08: 1 via NASAL
  Filled 2023-11-08: qty 30

## 2023-11-08 MED ORDER — OXYMETAZOLINE HCL 0.05 % NA SOLN
1.0000 | Freq: Once | NASAL | Status: AC
  Administered 2023-11-08: 1 via NASAL

## 2023-11-08 MED ORDER — SILVER NITRATE-POT NITRATE 75-25 % EX MISC
1.0000 | Freq: Once | CUTANEOUS | Status: AC
  Administered 2023-11-08: 1 via TOPICAL

## 2023-11-08 NOTE — ED Notes (Signed)
 Patient discharged from ED by provider. Discharge instructions reviewed with patient and all questions answered. Patient ambulatory from ED in NAD.

## 2023-11-08 NOTE — ED Provider Notes (Signed)
[facility] Provider Note   Event Date/Time   First MD Initiated Contact with Patient 11/08/23 2213     (approximate) History  Epistaxis  HPI Marvin Jefferson is a 83 y.o. male with stated past medical history of hypertension presents complaining of a nosebleed that began this morning and has been difficult to control at home.  Patient was given an Afrin nasal spray and clamp prior to my evaluation and patient is still bleeding.  Patient denies any blood thinning medications. ROS: Patient currently denies any vision changes, tinnitus, difficulty speaking, facial droop, sore throat, chest pain, shortness of breath, abdominal pain, nausea/vomiting/diarrhea, dysuria, or weakness/numbness/paresthesias in any extremity   Physical Exam  Triage Vital Signs: ED Triage Vitals  Encounter Vitals Group     BP 11/08/23 2123 114/83     Systolic BP Percentile --      Diastolic BP Percentile --      Pulse Rate 11/08/23 2123 (!) 112     Resp 11/08/23 2119 (!) 21     Temp 11/08/23 2123 98.4 F (36.9 C)     Temp Source 11/08/23 2123 Oral     SpO2 11/08/23 2118 98 %     Weight 11/08/23 2119 174 lb (78.9 kg)     Height 11/08/23 2119 5\' 6"  (1.676 m)     Head Circumference --      Peak Flow --      Pain Score 11/08/23 2119 0     Pain Loc --      Pain Education --      Exclude from Growth Chart --    Most recent vital signs: Vitals:   11/08/23 2119 11/08/23 2123  BP:  114/83  Pulse:  (!) 112  Resp: (!) 21   Temp:  98.4 F (36.9 C)  SpO2:     General: Awake, oriented x4. CV:  Good peripheral perfusion.  Resp:  Normal effort.  Abd:  No distention.  Other:  Elderly well-developed, well-nourished Caucasian male resting comfortably in no acute distress.  Right anterior pulsatile bleeding at the medial aspect of the nare ED Results / Procedures / Treatments  Labs (all labs ordered are listed, but only abnormal results are displayed) Labs Reviewed  CBC WITH  DIFFERENTIAL/PLATELET  PROCEDURES: Critical Care performed: No Procedures MEDICATIONS ORDERED IN ED: Medications  tranexamic acid (CYKLOKAPRON) 1000 MG/10ML topical solution 500 mg (has no administration in time range)  oxymetazoline (AFRIN) 0.05 % nasal spray 1 spray (1 spray Each Nare Given 11/08/23 2150)  silver nitrate applicators applicator 1 Application ( Topical Not Given 11/08/23 2230)  oxymetazoline (AFRIN) 0.05 % nasal spray 1 spray (1 spray Each Nare Provided for home use 11/08/23 2300)   IMPRESSION / MDM / ASSESSMENT AND PLAN / ED COURSE  I reviewed the triage vital signs and the nursing notes.                             The patient is on the cardiac monitor to evaluate for evidence of arrhythmia and/or significant heart rate changes. Patient's presentation is most consistent with acute presentation with potential threat to life or bodily function. The bleeding source appears to be anterior in origin. Blood clots were cleared by having the patient blow them out into a towel. After this, a nasal clamp was applied for about 10 minutes. Upon removing the nasal clamp, the bleeding continued and silver nitrate was applied.  After  several minutes, patient had breakthrough bleeding from silver nitrate administration however after readministration, patient states that this bleeding has stopped.  Patient was discharged with instructions for nasal moisturizer as well as Afrin as needed and follow-up with ENT  Dispo: Discharge home with ENT follow-up   FINAL CLINICAL IMPRESSION(S) / ED DIAGNOSES   Final diagnoses:  Right-sided epistaxis  Anterior epistaxis   Rx / DC Orders   ED Discharge Orders     None      Note:  This document was prepared using Dragon voice recognition software and may include unintentional dictation errors.   Merwyn Katos, MD 11/08/23 [phone]

## 2023-11-08 NOTE — ED Notes (Signed)
Pt with bleeding noted from rt nare........nose clamp removed; pt able to blow large amount of clots from nasal passages; afrin admin as ordered and nose clamp reapplied; pt assisted to clens hands and face of blood; significant decrease in bleeding noted and reported by pt

## 2023-11-08 NOTE — ED Triage Notes (Addendum)
Pt arrives via [facility] from home with CC of nose bleed for approx 1.5 hours with loss of (per pt) a lot of blood. Hx of same. Denies blood thinner use. External nasal clamp in place - pt reports still feeling bleeding draining down back of throat. Mucosa pink and pt does not appear to be pallored at this time. Lav, lt grn, and blue blood tubes sent to lab.

## 2024-03-28 ENCOUNTER — Ambulatory Visit: Payer: Self-pay | Admitting: Urology

## 2024-03-28 VITALS — BP 163/90 | HR 73 | Ht 65.0 in | Wt 180.0 lb

## 2024-03-28 DIAGNOSIS — N401 Enlarged prostate with lower urinary tract symptoms: Secondary | ICD-10-CM

## 2024-03-28 DIAGNOSIS — R3914 Feeling of incomplete bladder emptying: Secondary | ICD-10-CM | POA: Diagnosis not present

## 2024-03-28 LAB — BLADDER SCAN AMB NON-IMAGING: Scan Result: 341

## 2024-03-28 NOTE — Progress Notes (Signed)
 Elfrieda Grise Plume,acting as a scribe for Dustin Gimenez, MD.,have documented all relevant documentation on the behalf of Dustin Gimenez, MD,as directed by  Dustin Gimenez, MD while in the presence of Dustin Gimenez, MD.  03/28/24 1:56 PM   Marvin Jefferson [birth year] [id number]  Referring provider: Sari Cunning, MD [phone] [facility] [address] [facility] [place],  [place] [address]  Chief Complaint  Patient presents with   Benign Prostatic Hypertrophy    HPI: 83 year old male with a personal history of BPH with incomplete bladder emptying and Alzheimer's disease presents today for his routine annual follow-up.   Since his last visit a year ago, he was officially diagnosed with Alzheimer's disease, which has been progressing slowly. He reports difficulties with short-term memory, such as forgetting tasks and names, but continues to engage in activities like reading to stimulate his mind.  He is status post HoLEP.   He has been managing his bladder condition with self-catheterization due to elevated PVRs, which were historically around 200 mL but have recently increased to approximately 500 mL. His current PVR is 341 mL, which is stable. He is currently managed on Flomax  and has stopped self-catheterizing as he has not experienced any sequelae from incomplete emptying.   He has not had any UTIs or renal compromise over the past year.   His creatinine level was 1.1 in December 2024, and his PSA was 1.13 on June 24.   He reports no issues with his urinary tract and denies any urinary complaints.    Results for orders placed or performed in visit on 03/28/24  Bladder Scan (Post Void Residual) in office  Result Value Ref Range   Scan Result 341 ml      IPSS     Row Name 03/28/24 1300         International Prostate Symptom Score   How often have you had the sensation of not emptying your bladder? Less than 1 in 5     How often have you had to urinate less  than every two hours? Not at All     How often have you found you stopped and started again several times when you urinated? Less than 1 in 5 times     How often have you found it difficult to postpone urination? Not at All     How often have you had a weak urinary stream? Not at All     How often have you had to strain to start urination? Not at All     How many times did you typically get up at night to urinate? None     Total IPSS Score 2       Quality of Life due to urinary symptoms   If you were to spend the rest of your life with your urinary condition just the way it is now how would you feel about that? Delighted              Score:  1-7 Mild 8-19 Moderate 20-35 Severe  PMH: Past Medical History:  Diagnosis Date   Anxiety    Chronic back pain    DDD (degenerative disc disease), cervical    DDD (degenerative disc disease), lumbar    Dementia (HCC)    Depression    History of dysplastic nevus 12/23/2009   Right upper back 4.0 cm lat to spine   Hypertension    Meniere's disease    Pneumonia 2015   Sleep  apnea    Umbilical hernia    UTI (urinary tract infection)     Surgical History: Past Surgical History:  Procedure Laterality Date   APPENDECTOMY     COLONOSCOPY WITH PROPOFOL  N/A 05/23/2017   Procedure: COLONOSCOPY WITH PROPOFOL ;  Surgeon: Deveron Fly, MD;  Location: [facility] ENDOSCOPY;  Service: Endoscopy;  Laterality: N/A;   COLONOSCOPY WITH PROPOFOL  N/A 11/15/2022   Procedure: COLONOSCOPY WITH PROPOFOL ;  Surgeon: Quintin Buckle, DO;  Location: [facility] ENDOSCOPY;  Service: Gastroenterology;  Laterality: N/A;   CORONARY/GRAFT ACUTE MI REVASCULARIZATION N/A 04/19/2019   Procedure: Coronary/Graft Acute MI Revascularization;  Surgeon: Percival Brace, MD;  Location: [facility] INVASIVE CV LAB;  Service: Cardiovascular;  Laterality: N/A;   ESOPHAGOGASTRODUODENOSCOPY (EGD) WITH PROPOFOL  N/A 05/23/2017   Procedure: ESOPHAGOGASTRODUODENOSCOPY (EGD) WITH PROPOFOL ;   Surgeon: Deveron Fly, MD;  Location: [facility] ENDOSCOPY;  Service: Endoscopy;  Laterality: N/A;   GANGLION CYST EXCISION Right    wrist   HERNIA REPAIR     umbilical hernia   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 N/A 08/23/2016   Procedure: 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;  Surgeon: Dustin Gimenez, MD;  Location: [facility] ORS;  Service: Urology;  Laterality: N/A;   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 N/A 12/06/2016   Procedure: 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;  Surgeon: Dustin Gimenez, MD;  Location: [facility] ORS;  Service: Urology;  Laterality: N/A;   LEFT HEART CATH AND CORONARY ANGIOGRAPHY N/A 04/19/2019   Procedure: LEFT HEART CATH AND CORONARY ANGIOGRAPHY;  Surgeon: Percival Brace, MD;  Location: [facility] INVASIVE CV LAB;  Service: Cardiovascular;  Laterality: N/A;    Home Medications:  Allergies as of 03/28/2024       Reactions   Clarithromycin Other (See Comments)   Terrible taste and smell        Medication List        Accurate as of March 28, 2024  1:56 PM. If you have any questions, ask your nurse or doctor.          STOP taking these medications    acetaminophen  500 MG tablet Commonly known as: TYLENOL    ARIPiprazole 2 MG tablet Commonly known as: ABILIFY   melatonin 5 MG Tabs   metoprolol  tartrate 25 MG tablet Commonly known as: LOPRESSOR    OVER THE COUNTER MEDICATION   vitamin E 1000 UNIT capsule       TAKE these medications    aspirin  81 MG chewable tablet Chew 1 tablet (81 mg total) by mouth daily.   atorvastatin  80 MG tablet Commonly known as: LIPITOR Take 1 tablet (80 mg total) by mouth daily at 6 PM.   clopidogrel  75 MG tablet Commonly known as: PLAVIX  Take 75 mg by mouth daily.   cyanocobalamin  500 MCG tablet Commonly known as: VITAMIN B12 Take by mouth.   doxazosin 4 MG tablet Commonly known as: CARDURA Take by mouth.   FLUoxetine  40 MG  capsule Commonly known as: PROZAC  Take 40 mg by mouth daily.   galantamine  12 MG tablet Commonly known as: RAZADYNE  Take 12 mg by mouth 2 (two) times daily.   meclizine  25 MG tablet Commonly known as: ANTIVERT  Take 25 mg by mouth 3 (three) times daily as needed for dizziness.   memantine 10 MG tablet Commonly known as: NAMENDA Take 10 mg by mouth 2 (two) times daily.   mirtazapine 15 MG tablet Commonly known as: REMERON Take 15 mg by mouth at bedtime.   multivitamin with minerals  Tabs tablet Take 1 tablet by mouth daily.   tamsulosin  0.4 MG Caps capsule Commonly known as: FLOMAX  Take 1 capsule (0.4 mg total) by mouth daily.   triamterene-hydrochlorothiazide 37.5-25 MG capsule Commonly known as: DYAZIDE Take 1 capsule by mouth every morning.   Vitamin D3 50 MCG (2000 UT) capsule Take 1,000 Units by mouth daily.        Allergies:  Allergies  Allergen Reactions   Clarithromycin Other (See Comments)    Terrible taste and smell    Family History: Family History  Problem Relation Age of Onset   Prostate cancer Father    Hypertension Father    Stroke Father    Hypertension Mother    Dementia Mother    Bladder Cancer Neg Hx    Kidney cancer Neg Hx     Social History:  reports that he quit smoking about 61 years ago. His smoking use included cigarettes. He has never used smokeless tobacco. He reports that he does not drink alcohol and does not use drugs.   Physical Exam: BP (!) 163/90   Pulse 73   Ht 5' 5 (1.651 m)   Wt 180 lb (81.6 kg)   BMI 29.95 kg/m   Constitutional:  Alert and oriented, No acute distress. HEENT: [place] AT, moist mucus membranes.  Trachea midline, no masses. Neurologic: Grossly intact, no focal deficits, moving all 4 extremities. Psychiatric: Normal mood and affect.   Assessment & Plan:    1. BPH with incomplete bladder emptying - His PVR remains elevated but stable at 341 mL.  - He is managed on Flomax  and has discontinued  self-catheterization without sequelae. - There is likely some sensory impairment as previously discussed - No changes to the current management plan are necessary as he is not experiencing UTIs or renal compromise.  - Continue monitoring PVR and renal function annually.  2. Alzheimer's disease - He reports progressive short-term memory loss but remains engaged in activities such as reading.  - Encourage continued mental stimulation and consider discussing further cognitive evaluation or management options if symptoms significantly impact daily functioning.  Return in about 1 year (around 03/28/2025) for PA visit for routine annual follow up.  I have reviewed the above documentation for accuracy and completeness, and I agree with the above.   Dustin Gimenez, MD   [facility] [address] [place], [place] [address] [phone]

## 2024-07-17 ENCOUNTER — Ambulatory Visit: Payer: Medicare PPO | Admitting: Dermatology

## 2024-07-17 DIAGNOSIS — L814 Other melanin hyperpigmentation: Secondary | ICD-10-CM

## 2024-07-17 DIAGNOSIS — W908XXA Exposure to other nonionizing radiation, initial encounter: Secondary | ICD-10-CM

## 2024-07-17 DIAGNOSIS — D1801 Hemangioma of skin and subcutaneous tissue: Secondary | ICD-10-CM | POA: Diagnosis not present

## 2024-07-17 DIAGNOSIS — Z1283 Encounter for screening for malignant neoplasm of skin: Secondary | ICD-10-CM | POA: Diagnosis not present

## 2024-07-17 DIAGNOSIS — D692 Other nonthrombocytopenic purpura: Secondary | ICD-10-CM

## 2024-07-17 DIAGNOSIS — Z86018 Personal history of other benign neoplasm: Secondary | ICD-10-CM

## 2024-07-17 DIAGNOSIS — L570 Actinic keratosis: Secondary | ICD-10-CM | POA: Diagnosis not present

## 2024-07-17 DIAGNOSIS — D229 Melanocytic nevi, unspecified: Secondary | ICD-10-CM

## 2024-07-17 DIAGNOSIS — L820 Inflamed seborrheic keratosis: Secondary | ICD-10-CM

## 2024-07-17 DIAGNOSIS — L578 Other skin changes due to chronic exposure to nonionizing radiation: Secondary | ICD-10-CM

## 2024-07-17 DIAGNOSIS — I781 Nevus, non-neoplastic: Secondary | ICD-10-CM

## 2024-07-17 NOTE — Patient Instructions (Addendum)
 Call or send mychart  if spot on right leg that was treated today has not resolved in 8 weeks will need to recheck    Actinic keratoses are precancerous spots that appear secondary to cumulative UV radiation exposure/sun exposure over time. They are chronic with expected duration over 1 year. A portion of actinic keratoses will progress to squamous cell carcinoma of the skin. It is not possible to reliably predict which spots will progress to skin cancer and so treatment is recommended to prevent development of skin cancer.  Recommend daily broad spectrum sunscreen SPF 30+ to sun-exposed areas, reapply every 2 hours as needed.  Recommend staying in the shade or wearing long sleeves, sun glasses (UVA+UVB protection) and wide brim hats (4-inch brim around the entire circumference of the hat). Call for new or changing lesions.     Cryotherapy Aftercare  Wash gently with soap and water everyday.   Apply Vaseline and Band-Aid daily until healed.    Seborrheic Keratosis  What causes seborrheic keratoses? Seborrheic keratoses are harmless, common skin growths that first appear during adult life.  As time goes by, more growths appear.  Some people may develop a large number of them.  Seborrheic keratoses appear on both covered and uncovered body parts.  They are not caused by sunlight.  The tendency to develop seborrheic keratoses can be inherited.  They vary in color from skin-colored to gray, brown, or even black.  They can be either smooth or have a rough, warty surface.   Seborrheic keratoses are superficial and look as if they were stuck on the skin.  Under the microscope this type of keratosis looks like layers upon layers of skin.  That is why at times the top layer may seem to fall off, but the rest of the growth remains and re-grows.    Treatment Seborrheic keratoses do not need to be treated, but can easily be removed in the office.  Seborrheic keratoses often cause symptoms when they rub  on clothing or jewelry.  Lesions can be in the way of shaving.  If they become inflamed, they can cause itching, soreness, or burning.  Removal of a seborrheic keratosis can be accomplished by freezing, burning, or surgery. If any spot bleeds, scabs, or grows rapidly, please return to have it checked, as these can be an indication of a skin cancer.    Melanoma ABCDEs  Melanoma is the most dangerous type of skin cancer, and is the leading cause of death from skin disease.  You are more likely to develop melanoma if you: Have light-colored skin, light-colored eyes, or red or blond hair Spend a lot of time in the sun Tan regularly, either outdoors or in a tanning bed Have had blistering sunburns, especially during childhood Have a close family member who has had a melanoma Have atypical moles or large birthmarks  Early detection of melanoma is key since treatment is typically straightforward and cure rates are extremely high if we catch it early.   The first sign of melanoma is often a change in a mole or a new dark spot.  The ABCDE system is a way of remembering the signs of melanoma.  A for asymmetry:  The two halves do not match. B for border:  The edges of the growth are irregular. C for color:  A mixture of colors are present instead of an even brown color. D for diameter:  Melanomas are usually (but not always) greater than 6mm - the size of  a pencil eraser. E for evolution:  The spot keeps changing in size, shape, and color.  Please check your skin once per month between visits. You can use a small mirror in front and a large mirror behind you to keep an eye on the back side or your body.   If you see any new or changing lesions before your next follow-up, please call to schedule a visit.  Please continue daily skin protection including broad spectrum sunscreen SPF 30+ to sun-exposed areas, reapplying every 2 hours as needed when you're outdoors.   Staying in the shade or wearing  long sleeves, sun glasses (UVA+UVB protection) and wide brim hats (4-inch brim around the entire circumference of the hat) are also recommended for sun protection.    Due to recent changes in healthcare laws, you may see results of your pathology and/or laboratory studies on MyChart before the doctors have had a chance to review them. We understand that in some cases there may be results that are confusing or concerning to you. Please understand that not all results are received at the same time and often the doctors may need to interpret multiple results in order to provide you with the best plan of care or course of treatment. Therefore, we ask that you please give us  2 business days to thoroughly review all your results before contacting the office for clarification. Should we see a critical lab result, you will be contacted sooner.   If You Need Anything After Your Visit  If you have any questions or concerns for your doctor, please call our main line at [phone] and press option 4 to reach your doctor's medical assistant. If no one answers, please leave a voicemail as directed and we will return your call as soon as possible. Messages left after 4 pm will be answered the following business day.   You may also send us  a message via MyChart. We typically respond to MyChart messages within 1-2 business days.  For prescription refills, please ask your pharmacy to contact our office. Our fax number is [phone].  If you have an urgent issue when the clinic is closed that cannot wait until the next business day, you can page your doctor at the number below.    Please note that while we do our best to be available for urgent issues outside of office hours, we are not available 24/7.   If you have an urgent issue and are unable to reach us , you may choose to seek medical care at your doctor's office, retail clinic, urgent care center, or emergency room.  If you have a medical emergency, please  immediately call 911 or go to the emergency department.  Pager Numbers  - Dr. Hester: [phone]  - Dr. Jackquline: [phone]  - Dr. Claudene: [phone]   - Dr. Raymund: [phone]  In the event of inclement weather, please call our main line at [phone] for an update on the status of any delays or closures.  Dermatology Medication Tips: Please keep the boxes that topical medications come in in order to help keep track of the instructions about where and how to use these. Pharmacies typically print the medication instructions only on the boxes and not directly on the medication tubes.   If your medication is too expensive, please contact our office at [phone] option 4 or send us  a message through MyChart.   We are unable to tell what your co-pay for medications will be in advance as this is  different depending on your insurance coverage. However, we may be able to find a substitute medication at lower cost or fill out paperwork to get insurance to cover a needed medication.   If a prior authorization is required to get your medication covered by your insurance company, please allow us  1-2 business days to complete this process.  Drug prices often vary depending on where the prescription is filled and some pharmacies may offer cheaper prices.  The website www.goodrx.com contains coupons for medications through different pharmacies. The prices here do not account for what the cost may be with help from insurance (it may be cheaper with your insurance), but the website can give you the price if you did not use any insurance.  - You can print the associated coupon and take it with your prescription to the pharmacy.  - You may also stop by our office during regular business hours and pick up a GoodRx coupon card.  - If you need your prescription sent electronically to a different pharmacy, notify our office through [facility] or by phone at [phone] option  4.     Si Usted Necesita Algo Despus de Su Visita  Tambin puede enviarnos un mensaje a travs de Clinical cytogeneticist. Por lo general respondemos a los mensajes de MyChart en el transcurso de 1 a 2 das hbiles.  Para renovar recetas, por favor pida a su farmacia que se ponga en contacto con nuestra oficina. [place]i [place] de fax es [place] [phone].  Si tiene un asunto urgente cuando la clnica est cerrada y que no puede esperar hasta el siguiente da hbil, puede llamar/localizar a su doctor(a) al nmero que aparece a continuacin.   Por favor, tenga en cuenta que aunque hacemos todo lo posible para estar disponibles para asuntos urgentes fuera del horario de [place], no estamos disponibles las 24 horas del da, los 7 [address]  [address] de la [place].   Si tiene un problema urgente y no puede comunicarse con nosotros, puede optar por buscar atencin mdica  en el consultorio de su doctor(a), en una clnica privada, en un centro de atencin urgente o en una sala de emergencias.  Si tiene Engineer, drilling, por favor llame inmediatamente al 911 o vaya a la sala de emergencias.  Nmeros de bper  - Dr. Hester: [phone]  - Dra. Jackquline: [phone]  - Dr. Claudene: [phone]  - Dra. Kitts: [phone]  En caso de inclemencias del [place], por favor llame a nuestra lnea principal al [phone] para una actualizacin sobre el estado de cualquier retraso o cierre.  Consejos para la medicacin en dermatologa: Por favor, guarde las cajas en las que vienen los medicamentos de uso tpico para ayudarle a seguir las instrucciones sobre dnde y cmo usarlos. Las farmacias generalmente imprimen las instrucciones del medicamento slo en las cajas y no directamente en los tubos del [place].   Si su medicamento es muy caro, por favor, pngase en contacto con landry rieger llamando al [phone] y presione la opcin 4 o envenos un mensaje a travs de Clinical cytogeneticist.   No podemos decirle cul ser su copago  por los medicamentos por adelantado ya que esto es diferente dependiendo de la cobertura de su seguro. Sin embargo, es posible que podamos encontrar un medicamento sustituto a Audiological scientist un formulario para que el seguro cubra el medicamento que se considera necesario.   Si se requiere una autorizacin previa para que su compaa de seguros malta su medicamento, por favor permtanos de 1 a  2 das hbiles para completar [address]  Los precios de los medicamentos varan con frecuencia dependiendo del Environmental consultant de dnde se surte la receta y alguna farmacias pueden ofrecer precios ms baratos.  El sitio web www.goodrx.com tiene cupones para medicamentos de Health and safety inspector. Los precios aqu no tienen en cuenta lo que podra costar con la ayuda del seguro (puede ser ms barato con su seguro), pero el sitio web puede darle el precio si no utiliz Tourist information centre manager.  - Puede imprimir el cupn correspondiente y llevarlo con su receta a la farmacia.  - Tambin puede pasar por nuestra oficina durante el horario de atencin regular y Education officer, museum una tarjeta de cupones de GoodRx.  - Si necesita que su receta se enve electrnicamente a una farmacia diferente, informe a nuestra oficina a travs de MyChart de [place] o por telfono llamando al [phone] y presione la opcin 4.

## 2024-07-17 NOTE — Progress Notes (Unsigned)
 Follow-Up Visit   Subjective  Marvin Jefferson is a 83 y.o. male who presents for the following: Skin Cancer Screening and Full Body Skin Exam Hx of dysplastic nevi, hx of aks   Place on right leg he noticed 2 month ago The patient presents for Total-Body Skin Exam (TBSE) for skin cancer screening and mole check. The patient has spots, moles and lesions to be evaluated, some may be new or changing and the patient may have concern these could be cancer.  The following portions of the chart were reviewed this encounter and updated as appropriate: medications, allergies, medical history  Review of Systems:  No other skin or systemic complaints except as noted in HPI or Assessment and Plan.  Objective  Well appearing patient in no apparent distress; mood and affect are within normal limits.  A full examination was performed including scalp, head, eyes, ears, nose, lips, neck, chest, axillae, abdomen, back, buttocks, bilateral upper extremities, bilateral lower extremities, hands, feet, fingers, toes, fingernails, and toenails. All findings within normal limits unless otherwise noted below.   Relevant physical exam findings are noted in the Assessment and Plan.  face and ears x 10 (10) Erythematous thin papules/macules with gritty scale.  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  Call if any new or changing lesions are noted between office visits    SKIN CANCER SCREENING PERFORMED TODAY.  ACTINIC DAMAGE - Chronic condition, secondary to cumulative UV/sun exposure - diffuse scaly erythematous macules with underlying dyspigmentation - Recommend daily broad spectrum sunscreen SPF  30+ to sun-exposed areas, reapply every 2 hours as needed.  - Staying in the shade or wearing long sleeves, sun glasses (UVA+UVB protection) and wide brim hats (4-inch brim around the entire circumference of the hat) are also recommended for sun protection.  - Call for new or changing lesions.  LENTIGINES, SEBORRHEIC KERATOSES, HEMANGIOMAS - Benign normal skin lesions - Benign-appearing - Call for any changes  MELANOCYTIC NEVI - Tan-brown and/or pink-flesh-colored symmetric macules and papules - Benign appearing on exam today - Observation - Call clinic for new or changing moles - Recommend daily use of broad spectrum spf 30+ sunscreen to sun-exposed areas.   Purpura - Chronic; persistent and recurrent.  Treatable, but not curable. - Violaceous macules and patches - Benign - Related to trauma, age, sun damage and/or use of blood thinners, chronic use of topical and/or oral steroids - Observe - Can use OTC arnica containing moisturizer such as Dermend Bruise Formula if desired - Call for worsening or other concerns  Varicose Veins/Spider Veins - Dilated blue, purple or red veins at the lower extremities - Reassured - Smaller vessels can be treated by sclerotherapy (a procedure to inject a medicine into the veins to make them disappear) if desired, but the treatment is not covered by insurance. Larger vessels may be covered if symptomatic and we would refer to vascular surgeon if treatment desired.  ACTINIC KERATOSIS (10) face and ears x 10 (10) Actinic keratoses are precancerous spots that appear secondary to cumulative UV radiation exposure/sun exposure over time. They are chronic with expected duration over 1 year. A portion of actinic keratoses will progress to squamous cell carcinoma of  the skin. It is not possible to reliably predict which spots will progress to skin cancer and so treatment is recommended to prevent development of skin cancer.  Recommend daily broad spectrum  sunscreen SPF 30+ to sun-exposed areas, reapply every 2 hours as needed.  Recommend staying in the shade or wearing long sleeves, sun glasses (UVA+UVB protection) and wide brim hats (4-inch brim around the entire circumference of the hat). Call for new or changing lesions. Destruction of lesion - face and ears x 10 (10) Complexity: simple   Destruction method: cryotherapy   Informed consent: discussed and consent obtained   Timeout:  patient name, date of birth, surgical site, and procedure verified Lesion destroyed using liquid nitrogen: Yes   Region frozen until ice ball extended beyond lesion: Yes   Outcome: patient tolerated procedure well with no complications   Post-procedure details: wound care instructions given    INFLAMED SEBORRHEIC KERATOSIS right lateral proximal calf x 1 Symptomatic, irritating, patient would like treated.  Discussed if spot is not resolved in 8 weeks to call or send mychart will need to recheck Destruction of lesion - right lateral proximal calf x 1 Complexity: simple   Destruction method: cryotherapy   Informed consent: discussed and consent obtained   Timeout:  patient name, date of birth, surgical site, and procedure verified Lesion destroyed using liquid nitrogen: Yes   Region frozen until ice ball extended beyond lesion: Yes   Cryotherapy cycles:  2 Outcome: patient tolerated procedure well with no complications   Post-procedure details: wound care instructions given    Return in about 1 year (around 07/17/2025) for TBSE.  IEleanor Blush, CMA, am acting as scribe for Alm Rhyme, MD.  Documentation: I have reviewed the above documentation for accuracy and completeness, and I agree with the above.  Alm Rhyme, MD

## 2024-07-18 ENCOUNTER — Encounter: Payer: Self-pay | Admitting: Dermatology

## 2025-03-26 ENCOUNTER — Ambulatory Visit: Admitting: Physician Assistant

## 2025-07-23 ENCOUNTER — Ambulatory Visit: Admitting: Dermatology
